# Patient Record
Sex: Female | Born: 1990
Health system: Southern US, Community
[De-identification: ages and names within clinical notes are randomized; demographics above are authoritative.]

## PROBLEM LIST (undated history)

## (undated) ENCOUNTER — Inpatient Hospital Stay (HOSPITAL_COMMUNITY): Payer: Self-pay

## (undated) DIAGNOSIS — J40 Bronchitis, not specified as acute or chronic: Secondary | ICD-10-CM

## (undated) DIAGNOSIS — S069X9A Unspecified intracranial injury with loss of consciousness of unspecified duration, initial encounter: Secondary | ICD-10-CM

## (undated) DIAGNOSIS — S069XAA Unspecified intracranial injury with loss of consciousness status unknown, initial encounter: Secondary | ICD-10-CM

## (undated) HISTORY — PX: NO PAST SURGERIES: SHX2092

## (undated) HISTORY — DX: Unspecified intracranial injury with loss of consciousness of unspecified duration, initial encounter: S06.9X9A

## (undated) HISTORY — DX: Unspecified intracranial injury with loss of consciousness status unknown, initial encounter: S06.9XAA

---
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
## 2009-01-13 ENCOUNTER — Emergency Department (HOSPITAL_COMMUNITY): Admission: EM | Admit: 2009-01-13 | Discharge: 2009-01-13 | Payer: Self-pay | Admitting: Emergency Medicine

## 2009-02-03 ENCOUNTER — Emergency Department (HOSPITAL_COMMUNITY): Admission: EM | Admit: 2009-02-03 | Discharge: 2009-02-03 | Payer: Self-pay | Admitting: Emergency Medicine

## 2009-10-07 ENCOUNTER — Emergency Department (HOSPITAL_COMMUNITY): Admission: EM | Admit: 2009-10-07 | Discharge: 2009-10-08 | Payer: Self-pay | Admitting: Emergency Medicine

## 2009-10-30 ENCOUNTER — Emergency Department (HOSPITAL_COMMUNITY): Admission: EM | Admit: 2009-10-30 | Discharge: 2009-10-30 | Payer: Self-pay | Admitting: Emergency Medicine

## 2010-08-21 ENCOUNTER — Emergency Department (HOSPITAL_COMMUNITY): Admission: EM | Admit: 2010-08-21 | Discharge: 2010-08-21 | Payer: Self-pay | Admitting: Emergency Medicine

## 2010-12-12 LAB — URINE MICROSCOPIC-ADD ON

## 2010-12-12 LAB — URINE CULTURE: Colony Count: >=100000

## 2010-12-12 LAB — URINALYSIS, ROUTINE W REFLEX MICROSCOPIC
Protein, ur: 100 mg/dL — AB
Specific Gravity, Urine: 1.036 — ABNORMAL HIGH (ref 1.005–1.030)
Urobilinogen, UA: 1 mg/dL (ref 0.0–1.0)
pH: 7 (ref 5.0–8.0)

## 2010-12-12 LAB — PREGNANCY, URINE: Preg Test, Ur: NEGATIVE

## 2010-12-12 LAB — GC/CHLAMYDIA PROBE AMP, GENITAL
Chlamydia, DNA Probe: NEGATIVE
GC Probe Amp, Genital: NEGATIVE

## 2010-12-12 LAB — WET PREP, GENITAL
Trich, Wet Prep: NONE SEEN
Yeast Wet Prep HPF POC: NONE SEEN

## 2010-12-15 LAB — DIFFERENTIAL
Basophils Relative: 0 % (ref 0–1)
Eosinophils Absolute: 0 10*3/uL (ref 0.0–0.7)
Eosinophils Relative: 0 % (ref 0–5)
Lymphocytes Relative: 16 % (ref 12–46)
Monocytes Absolute: 0.8 10*3/uL (ref 0.1–1.0)
Neutro Abs: 4.6 10*3/uL (ref 1.7–7.7)
Neutrophils Relative %: 72 % (ref 43–77)

## 2010-12-15 LAB — CBC
HCT: 31.9 % — ABNORMAL LOW (ref 36.0–46.0)
Hemoglobin: 10.9 g/dL — ABNORMAL LOW (ref 12.0–15.0)
MCHC: 34.1 g/dL (ref 30.0–36.0)
RBC: 3.59 MIL/uL — ABNORMAL LOW (ref 3.87–5.11)
RDW: 15.5 % (ref 11.5–15.5)

## 2010-12-15 LAB — POCT PREGNANCY, URINE: Preg Test, Ur: NEGATIVE

## 2010-12-15 LAB — URINALYSIS, ROUTINE W REFLEX MICROSCOPIC
Nitrite: POSITIVE — AB
Protein, ur: 100 mg/dL — AB
Specific Gravity, Urine: 1.024 (ref 1.005–1.030)
Urobilinogen, UA: 1 mg/dL (ref 0.0–1.0)
pH: 8.5 — ABNORMAL HIGH (ref 5.0–8.0)

## 2010-12-15 LAB — POCT I-STAT, CHEM 8
BUN: 14 mg/dL (ref 6–23)
Calcium, Ion: 1.12 mmol/L (ref 1.12–1.32)
Chloride: 106 meq/L (ref 96–112)
Creatinine, Ser: 0.7 mg/dL (ref 0.4–1.2)
Glucose, Bld: 78 mg/dL (ref 70–99)
HCT: 33 % — ABNORMAL LOW (ref 36.0–46.0)
Hemoglobin: 11.2 g/dL — ABNORMAL LOW (ref 12.0–15.0)
Potassium: 3.4 mEq/L — ABNORMAL LOW (ref 3.5–5.1)
Sodium: 137 mEq/L (ref 135–145)
TCO2: 23 mmol/L (ref 0–100)

## 2010-12-15 LAB — URINE MICROSCOPIC-ADD ON

## 2010-12-15 LAB — RAPID STREP SCREEN (MED CTR MEBANE ONLY): Streptococcus, Group A Screen (Direct): NEGATIVE

## 2011-01-04 LAB — POCT I-STAT, CHEM 8
Glucose, Bld: 86 mg/dL (ref 70–99)
Hemoglobin: 11.6 g/dL — ABNORMAL LOW (ref 12.0–15.0)
Potassium: 3.9 mEq/L (ref 3.5–5.1)
Sodium: 140 mEq/L (ref 135–145)

## 2011-01-04 LAB — URINALYSIS, ROUTINE W REFLEX MICROSCOPIC
Nitrite: POSITIVE — AB
Protein, ur: NEGATIVE mg/dL
Urobilinogen, UA: 1 mg/dL (ref 0.0–1.0)

## 2011-01-04 LAB — URINE CULTURE: Colony Count: >=100000

## 2011-01-04 LAB — DIFFERENTIAL
Basophils Absolute: 0 10*3/uL (ref 0.0–0.1)
Basophils Relative: 0 % (ref 0–1)
Lymphocytes Relative: 13 % (ref 12–46)
Lymphs Abs: 1.6 10*3/uL (ref 0.7–4.0)
Monocytes Absolute: 0.7 10*3/uL (ref 0.1–1.0)
Monocytes Relative: 6 % (ref 3–12)
Neutrophils Relative %: 75 % (ref 43–77)

## 2011-01-04 LAB — CBC
Hemoglobin: 11.1 g/dL — ABNORMAL LOW (ref 12.0–15.0)
Platelets: 339 10*3/uL (ref 150–400)
RBC: 3.73 MIL/uL — ABNORMAL LOW (ref 3.87–5.11)
RDW: 16.6 % — ABNORMAL HIGH (ref 11.5–15.5)

## 2011-01-04 LAB — URINE MICROSCOPIC-ADD ON

## 2011-01-05 LAB — URINALYSIS, ROUTINE W REFLEX MICROSCOPIC
Glucose, UA: NEGATIVE mg/dL
Ketones, ur: 15 mg/dL — AB
Protein, ur: NEGATIVE mg/dL
Specific Gravity, Urine: 1.027 (ref 1.005–1.030)
Urobilinogen, UA: 1 mg/dL (ref 0.0–1.0)

## 2011-01-05 LAB — GC/CHLAMYDIA PROBE AMP, GENITAL: Chlamydia, DNA Probe: NEGATIVE

## 2011-01-05 LAB — URINE CULTURE: Colony Count: >=100000

## 2011-01-05 LAB — POCT PREGNANCY, URINE: Preg Test, Ur: NEGATIVE

## 2011-01-05 LAB — WET PREP, GENITAL: Trich, Wet Prep: NONE SEEN

## 2011-01-05 LAB — URINE MICROSCOPIC-ADD ON

## 2011-01-26 ENCOUNTER — Inpatient Hospital Stay (HOSPITAL_COMMUNITY)
Admission: RE | Admit: 2011-01-26 | Discharge: 2011-01-26 | Disposition: A | Discharge status: Home / Self Care | Payer: Self-pay | Source: Ambulatory Visit | Attending: Obstetrics and Gynecology | Admitting: Obstetrics and Gynecology

## 2011-01-26 DIAGNOSIS — N949 Unspecified condition associated with female genital organs and menstrual cycle: Secondary | ICD-10-CM | POA: Insufficient documentation

## 2011-01-26 DIAGNOSIS — N760 Acute vaginitis: Secondary | ICD-10-CM | POA: Insufficient documentation

## 2011-01-26 DIAGNOSIS — N390 Urinary tract infection, site not specified: Secondary | ICD-10-CM | POA: Insufficient documentation

## 2011-01-26 LAB — URINALYSIS, ROUTINE W REFLEX MICROSCOPIC
Bilirubin Urine: NEGATIVE
Glucose, UA: NEGATIVE mg/dL
Ketones, ur: NEGATIVE mg/dL
Nitrite: POSITIVE — AB
Protein, ur: NEGATIVE mg/dL
pH: 6 (ref 5.0–8.0)

## 2011-01-26 LAB — URINE MICROSCOPIC-ADD ON

## 2011-01-26 LAB — WET PREP, GENITAL: Yeast Wet Prep HPF POC: NONE SEEN

## 2011-01-26 LAB — POCT PREGNANCY, URINE: Preg Test, Ur: NEGATIVE

## 2011-01-27 LAB — GC/CHLAMYDIA PROBE AMP, GENITAL: Chlamydia, DNA Probe: UNDETERMINED

## 2011-01-27 LAB — HERPES SIMPLEX VIRUS CULTURE

## 2011-03-06 ENCOUNTER — Emergency Department (HOSPITAL_COMMUNITY): Payer: Self-pay

## 2011-03-06 ENCOUNTER — Emergency Department (HOSPITAL_COMMUNITY)
Admission: EM | Admit: 2011-03-06 | Discharge: 2011-03-06 | Disposition: A | Discharge status: Home / Self Care | Payer: Self-pay | Attending: Emergency Medicine | Admitting: Emergency Medicine

## 2011-03-06 DIAGNOSIS — J069 Acute upper respiratory infection, unspecified: Secondary | ICD-10-CM | POA: Insufficient documentation

## 2011-03-06 DIAGNOSIS — J3489 Other specified disorders of nose and nasal sinuses: Secondary | ICD-10-CM | POA: Insufficient documentation

## 2011-03-06 DIAGNOSIS — R059 Cough, unspecified: Secondary | ICD-10-CM | POA: Insufficient documentation

## 2011-03-06 DIAGNOSIS — R05 Cough: Secondary | ICD-10-CM | POA: Insufficient documentation

## 2011-06-23 LAB — URINE CULTURE: Colony Count: >=100000

## 2011-06-23 LAB — URINALYSIS, ROUTINE W REFLEX MICROSCOPIC
Glucose, UA: NEGATIVE
Ketones, ur: NEGATIVE
Nitrite: POSITIVE — AB
Protein, ur: 30 — AB
pH: 6

## 2011-06-23 LAB — URINE MICROSCOPIC-ADD ON

## 2011-06-23 LAB — WET PREP, GENITAL

## 2011-06-23 LAB — HCG, QUANTITATIVE, PREGNANCY: hCG, Beta Chain, Quant, S: 3435 — ABNORMAL HIGH

## 2011-06-23 LAB — ABO/RH: ABO/RH(D): A POS

## 2011-06-23 LAB — CBC
Hemoglobin: 11.6 — ABNORMAL LOW
MCHC: 33.4
RBC: 3.77 — ABNORMAL LOW
WBC: 8.9

## 2011-06-23 LAB — GC/CHLAMYDIA PROBE AMP, GENITAL: GC Probe Amp, Genital: NEGATIVE

## 2011-08-07 ENCOUNTER — Emergency Department (HOSPITAL_COMMUNITY)
Admission: EM | Admit: 2011-08-07 | Discharge: 2011-08-07 | Discharge status: Against Medical Advice | Payer: Self-pay | Attending: Emergency Medicine | Admitting: Emergency Medicine

## 2011-08-07 ENCOUNTER — Encounter: Payer: Self-pay | Admitting: Emergency Medicine

## 2011-08-07 DIAGNOSIS — J069 Acute upper respiratory infection, unspecified: Secondary | ICD-10-CM | POA: Insufficient documentation

## 2011-08-07 HISTORY — DX: Bronchitis, not specified as acute or chronic: J40

## 2011-08-07 NOTE — ED Notes (Signed)
Pt reports cold symptoms x 1- 1/2 weeks. Pt presents with stuffy nose, watery eyes and productive cough.

## 2011-08-07 NOTE — ED Notes (Signed)
Attempted to locate pt in triage and wait, no answer

## 2012-04-24 LAB — OB RESULTS CONSOLE RPR: RPR: NONREACTIVE

## 2012-04-24 LAB — OB RESULTS CONSOLE RUBELLA ANTIBODY, IGM: Rubella: NON-IMMUNE/NOT IMMUNE

## 2012-04-24 LAB — OB RESULTS CONSOLE ANTIBODY SCREEN: Antibody Screen: NEGATIVE

## 2012-04-24 LAB — OB RESULTS CONSOLE ABO/RH: RH Type: POSITIVE

## 2012-08-17 LAB — OB RESULTS CONSOLE HGB/HCT, BLOOD: Hemoglobin: 11.1 g/dL

## 2012-08-17 LAB — GLUCOSE TOLERANCE, 1 HOUR (50G) W/O FASTING: Glucose, GTT - 1 Hour: 101 mg/dL (ref ?–200)

## 2012-08-17 LAB — OB RESULTS CONSOLE PLATELET COUNT: Platelets: 326 10*3/uL

## 2012-08-17 LAB — OB RESULTS CONSOLE HIV ANTIBODY (ROUTINE TESTING): HIV: NONREACTIVE

## 2012-08-25 ENCOUNTER — Encounter (HOSPITAL_COMMUNITY): Payer: Self-pay | Admitting: Emergency Medicine

## 2012-08-25 ENCOUNTER — Emergency Department (HOSPITAL_COMMUNITY)
Admission: EM | Admit: 2012-08-25 | Discharge: 2012-08-25 | Disposition: A | Discharge status: Home / Self Care | Payer: Medicaid - Out of State | Attending: Emergency Medicine | Admitting: Emergency Medicine

## 2012-08-25 DIAGNOSIS — J40 Bronchitis, not specified as acute or chronic: Secondary | ICD-10-CM

## 2012-08-25 DIAGNOSIS — F17200 Nicotine dependence, unspecified, uncomplicated: Secondary | ICD-10-CM | POA: Insufficient documentation

## 2012-08-25 DIAGNOSIS — Z79899 Other long term (current) drug therapy: Secondary | ICD-10-CM | POA: Insufficient documentation

## 2012-08-25 DIAGNOSIS — R0602 Shortness of breath: Secondary | ICD-10-CM | POA: Insufficient documentation

## 2012-08-25 DIAGNOSIS — R0789 Other chest pain: Secondary | ICD-10-CM | POA: Insufficient documentation

## 2012-08-25 DIAGNOSIS — R1110 Vomiting, unspecified: Secondary | ICD-10-CM | POA: Insufficient documentation

## 2012-08-25 DIAGNOSIS — J3489 Other specified disorders of nose and nasal sinuses: Secondary | ICD-10-CM | POA: Insufficient documentation

## 2012-08-25 DIAGNOSIS — R062 Wheezing: Secondary | ICD-10-CM | POA: Insufficient documentation

## 2012-08-25 MED ORDER — PREDNISONE 20 MG PO TABS: 20.0000 mg | ORAL_TABLET | Freq: Two times a day (BID) | ORAL | Status: DC

## 2012-08-25 MED ORDER — IPRATROPIUM BROMIDE 0.02 % IN SOLN
0.5000 mg | Freq: Once | RESPIRATORY_TRACT | Status: AC
  Administered 2012-08-25: 0.5 mg via RESPIRATORY_TRACT
  Filled 2012-08-25: qty 2.5

## 2012-08-25 MED ORDER — ALBUTEROL SULFATE (5 MG/ML) 0.5% IN NEBU
5.0000 mg | INHALATION_SOLUTION | Freq: Once | RESPIRATORY_TRACT | Status: AC
  Administered 2012-08-25: 5 mg via RESPIRATORY_TRACT
  Filled 2012-08-25: qty 1

## 2012-08-25 MED ORDER — ALBUTEROL SULFATE HFA 108 (90 BASE) MCG/ACT IN AERS
INHALATION_SPRAY | RESPIRATORY_TRACT | Status: AC
  Filled 2012-08-25: qty 6.7

## 2012-08-25 MED ORDER — PREDNISONE 20 MG PO TABS
60.0000 mg | ORAL_TABLET | Freq: Once | ORAL | Status: AC
  Administered 2012-08-25: 60 mg via ORAL
  Filled 2012-08-25: qty 3

## 2012-08-25 NOTE — ED Notes (Signed)
Breathing treatment started by respiratory.

## 2012-08-25 NOTE — ED Provider Notes (Signed)
I personally performed the services described in this documentation, which was scribed in my presence. The recorded information has been reviewed and is accurate.  Tysheem Accardo L Allenmichael Mcpartlin, MD 08/25/12 2323 

## 2012-08-25 NOTE — ED Provider Notes (Signed)
History     CSN: [id number]  Arrival date & time 08/25/12  4098   First MD Initiated Contact with Patient 08/25/12 2012      Chief Complaint  Patient presents with  . Cough  . Shortness of Breath    (Consider location/radiation/quality/duration/timing/severity/associated sxs/prior treatment) HPI Comments: 21 year old female that is 5 months pregnant presents emergency department with chief complaint of cough.  Patient states she has a history of bronchitis and that her symptoms have been gradually worsening over the last 5 days.  Patient is concerned to take any medication because of her pregnancy and has not tried any treatments.  Associated symptoms include posttussive emesis, shortness of breath and nasal congestion, but patient denies fevers, night sweats, chills, chest pain, syncope.  The history is provided by the patient.    Past Medical History  Diagnosis Date  . Bronchitis     History reviewed. No pertinent past surgical history.  History reviewed. No pertinent family history.  History  Substance Use Topics  . Smoking status: Current Every Day Smoker  . Smokeless tobacco: Not on file  . Alcohol Use: Yes    OB History    Grav Para Term Preterm Abortions TAB SAB Ect Mult Living   1               Review of Systems  Constitutional: Negative for fever, chills, appetite change and fatigue.  HENT: Positive for congestion and rhinorrhea. Negative for hearing loss, ear pain, nosebleeds, sore throat, sneezing, trouble swallowing, neck stiffness, voice change, postnasal drip, sinus pressure, tinnitus and ear discharge.   Eyes: Negative for photophobia and visual disturbance.  Respiratory: Positive for cough, chest tightness, shortness of breath and wheezing. Negative for apnea, choking and stridor.   Cardiovascular: Negative for chest pain, palpitations and leg swelling.  Gastrointestinal: Negative for nausea, vomiting, abdominal pain, diarrhea and constipation.    Genitourinary: Negative for dysuria, urgency and flank pain.  Musculoskeletal: Negative for myalgias.  Neurological: Negative for dizziness, seizures, syncope, weakness, light-headedness, numbness and headaches.  Psychiatric/Behavioral: Negative for behavioral problems and confusion.  All other systems reviewed and are negative.    Allergies  Review of patient's allergies indicates no known allergies.  Home Medications   Current Outpatient Rx  Name  Route  Sig  Dispense  Refill  . FERROUS SULFATE 325 (65 FE) MG PO TABS   Oral   Take 325 mg by mouth daily with breakfast.         . LORATADINE 10 MG PO TABS   Oral   Take 10 mg by mouth daily.         Marland Kitchen PRENATAL MULTIVITAMIN CH   Oral   Take 1 tablet by mouth daily.           BP 125/70  Pulse 103  Temp 98.4 F (36.9 C) (Oral)  Resp 20  SpO2 96%  LMP 07/14/2011  Physical Exam  Nursing note and vitals reviewed. Constitutional: She is oriented to person, place, and time. She appears well-developed and well-nourished. No distress.  HENT:  Head: Normocephalic and atraumatic.       Nasal congestion & rhinorrhea. Normal L & R external ear canals and TMs. No tragal or mastoid tenderness. Oropharynx moist, without tonsillar exudate.   Eyes:       No pain w EOM. Conjunctiva normal   Neck: Normal range of motion.       Soft, no nuchal rigidity. No lymphadenopathy  Cardiovascular:  RRR, no aberrancy on auscultation  Pulmonary/Chest: Effort normal.       No respiratory distress, expiratory wheezing   Abdominal:       gravid  Musculoskeletal: Normal range of motion.  Neurological: She is alert and oriented to person, place, and time.  Skin: Skin is warm and dry. She is not diaphoretic.       No rash  Psychiatric: She has a normal mood and affect. Her behavior is normal.    ED Course  Procedures (including critical care time)  Labs Reviewed - No data to display No results found.   No diagnosis found.  Pt  case discussed with Dr. Effie Shy to decide what treatments can be used d/t 5 mo pregnant. Plan to treat w neb & steroids. Presentation consistent w bronchitis. Imaging not indicated at this time. Advised return if fever develops or symptoms worsen   MDM  21 yo 5 mo pregnant pt presents w nasal congestion, cough and SOB- likely viral etiology. Discussed that antibiotics are not indicated for viral infections.  Verbalizes understanding and is agreeable with plan as above. Lung auscultation improved after treatment & pt able to speak in full sentences, no evidence of acute respiratory distress.  Pt is hemodynamically stable & in NAD prior to dc.          Jaci Carrel, [place] 08/25/12 2111

## 2012-08-25 NOTE — ED Notes (Signed)
Patient states that she started to have SOB and cough today. She states that she is 5 months pregnant

## 2012-09-13 ENCOUNTER — Encounter (HOSPITAL_COMMUNITY): Payer: Self-pay | Admitting: *Deleted

## 2012-09-13 ENCOUNTER — Inpatient Hospital Stay (HOSPITAL_COMMUNITY)
Admission: AD | Admit: 2012-09-13 | Discharge: 2012-09-14 | Disposition: A | Discharge status: Home / Self Care | Payer: Medicaid - Out of State | Source: Ambulatory Visit | Attending: Obstetrics & Gynecology | Admitting: Obstetrics & Gynecology

## 2012-09-13 DIAGNOSIS — O99891 Other specified diseases and conditions complicating pregnancy: Secondary | ICD-10-CM | POA: Insufficient documentation

## 2012-09-13 DIAGNOSIS — R109 Unspecified abdominal pain: Secondary | ICD-10-CM | POA: Insufficient documentation

## 2012-09-13 DIAGNOSIS — W010XXA Fall on same level from slipping, tripping and stumbling without subsequent striking against object, initial encounter: Secondary | ICD-10-CM

## 2012-09-13 NOTE — MAU Note (Signed)
Pt stated she triped on floor and slipped and fell on left side. Hit stomach on her side. Pt reports having mild discomfort  To abd at this time. Reports good fetal movement.

## 2012-09-13 NOTE — MAU Provider Note (Signed)
  History     CSN: [id number]  Arrival date and time: 09/13/12 2200   None     Chief Complaint  Patient presents with  . Fall   HPI Mrs Berch is 21yo G2P0010 with GA [redacted]w[redacted]d who felt on her abdomen around 21:30. Pt reports some abdominal pain, in lower abdomen. She denies vaginal discharge or bleeding. Good fetal movement. Pt has her pre natal care in [place].   Past Medical History  Diagnosis Date  . Bronchitis     Past Surgical History  Procedure Date  . No past surgeries     Family History  Problem Relation Age of Onset  . Other Neg Hx     History  Substance Use Topics  . Smoking status: Former Games developer  . Smokeless tobacco: Not on file  . Alcohol Use: No    Allergies: No Known Allergies  Prescriptions prior to admission  Medication Sig Dispense Refill  . ferrous sulfate 325 (65 FE) MG tablet Take 325 mg by mouth daily with breakfast.      . Prenatal Vit-Fe Fumarate-FA (PRENATAL MULTIVITAMIN) TABS Take 1 tablet by mouth daily.      Marland Kitchen loratadine (CLARITIN) 10 MG tablet Take 10 mg by mouth daily.      . predniSONE (DELTASONE) 20 MG tablet Take 1 tablet (20 mg total) by mouth 2 (two) times daily.  6 tablet  0    Review of Systems  Constitutional: Negative for fever.  Eyes: Negative for blurred vision.  Cardiovascular: Negative for chest pain.  Gastrointestinal: Positive for abdominal pain. Negative for nausea.  Neurological: Negative for headaches.   Physical Exam   Blood pressure 107/72, pulse 94, temperature 98.4 F (36.9 C), temperature source Oral, resp. rate 18, height 5\' 4"  (1.626 m), weight 68.402 kg (150 lb 12.8 oz), last menstrual period 07/14/2011.  Physical Exam  Constitutional: She appears well-developed.  Cardiovascular: Normal rate.   Respiratory: Effort normal.  GI: Soft.  FHT: 140s, accels presents, no decels. Moderate variability.  CAtegory I  MAU Course  Procedures  MDM Monitoring for 4h Kleihauer-Betke stain  negative  Assessment and Plan  Mrs Teachey is a 21yo F, G2P0010, with GA [redacted]w[redacted]d who felt on her abdomen. After monitoring for 4 hours patient didn't present contractions and her Kleihauher-betke was negative Pt was stable to be discharge.  Governor Specking 09/13/2012, 11:47 PM

## 2012-09-14 LAB — KLEIHAUER-BETKE STAIN: Fetal Cells %: 0 %

## 2012-09-14 MED ORDER — ACETAMINOPHEN 325 MG PO TABS: 650.0000 mg | ORAL_TABLET | Freq: Four times a day (QID) | ORAL | Status: DC | PRN

## 2012-09-14 MED ORDER — ACETAMINOPHEN 325 MG PO TABS
650.0000 mg | ORAL_TABLET | Freq: Four times a day (QID) | ORAL | Status: DC | PRN
  Administered 2012-09-14: 650 mg via ORAL
  Filled 2012-09-14: qty 2

## 2012-09-14 NOTE — MAU Provider Note (Signed)
I have seen and examined this patient and agree the above assessment. CRESENZO-DISHMAN,Laverle Pillard 09/14/2012 6:43 AM

## 2012-09-26 NOTE — L&D Delivery Note (Signed)

## 2012-09-26 NOTE — L&D Delivery Note (Signed)
Delivery Note At 12:07 AM a viable and healthy female was delivered via  (Presentation: OP ;  ).  APGAR:8, 9; weight pending.   Placenta status: complete delivery, calcifications noted, sent to pathology.  Cord: 3 vessel.   Anesthesia: Epidural  Episiotomy: None Lacerations: 1st degree  Suture Repair: 3.0 vicryl rapide Est. Blood Loss (mL): 250  Mom to postpartum.  Baby to nursery-stable.  Gregor Hams 12/05/2012, 1:46 AM   At 0007 ths 23 y/o G1 now P1001 delivered a viable female infant over an intact perineum by NSVD with APGARs at 8 and 9.  Pt's pain was managed epidural.  Infant was placed on mom's chest after delivery and cord clamping was delayed till pulsation and stopped within the cord.  Cord was clamped and Dad cut the cord.  Cord blood was collected and sent.  Intact 3 vessel cord placenta were delivered spontaneiously.  Cervix and vagina were inspected.  1st degree laceration was repaired with 3.0 vicryl rapide.  EBL 250.  Pt stable to mother baby and baby to [facility].  Delivery supervised by Caren Griffins and preformed by Gregor Hams, DO.    Evaluation and management procedures were performed by Resident physician under my supervision/collaboration. Chart reviewed, patient examined by me and I agree with management and plan. Present and assisted. Wt 6#7oz Position OA>LOA.

## 2012-11-01 LAB — OB RESULTS CONSOLE GBS: GBS: NEGATIVE

## 2012-11-15 ENCOUNTER — Inpatient Hospital Stay (HOSPITAL_COMMUNITY)
Admission: AD | Admit: 2012-11-15 | Discharge: 2012-11-15 | Disposition: A | Discharge status: Home / Self Care | Payer: Medicaid Other | Source: Ambulatory Visit | Attending: Obstetrics & Gynecology | Admitting: Obstetrics & Gynecology

## 2012-11-15 ENCOUNTER — Encounter (HOSPITAL_COMMUNITY): Payer: Self-pay | Admitting: *Deleted

## 2012-11-15 ENCOUNTER — Inpatient Hospital Stay (HOSPITAL_COMMUNITY): Payer: Medicaid Other

## 2012-11-15 DIAGNOSIS — Z3490 Encounter for supervision of normal pregnancy, unspecified, unspecified trimester: Secondary | ICD-10-CM

## 2012-11-15 DIAGNOSIS — O368190 Decreased fetal movements, unspecified trimester, not applicable or unspecified: Secondary | ICD-10-CM

## 2012-11-15 LAB — WET PREP, GENITAL
Clue Cells Wet Prep HPF POC: NONE SEEN
Trich, Wet Prep: NONE SEEN
Yeast Wet Prep HPF POC: NONE SEEN

## 2012-11-15 NOTE — MAU Note (Signed)
Pt moved here from [place]. Told her baby was not growing was having stress test  2x week in [place].  Nedding to establish care here.

## 2012-11-15 NOTE — MAU Provider Note (Signed)
History    CSN: [id number]  Arrival date and time: 11/15/12 1302   First Provider Initiated Contact with Patient 11/15/12 1426      Chief Complaint  Patient presents with  . Non-stress Test   HPI Leah Rojas is a 22 yo G2P0 female at [redacted]w[redacted]d gestation. The patient reports that she has been getting all of her prenatal care in [place]. She reports that her pregnancy has been uncomplicated. She was diagnosed with a mild anemia during the pregnancy and took iron for a month or two. She reports that at 35 weeks she was seen by her doctor and was told that the baby was "normal to small" by ultrasound. Her doctor recommended that she have NSTs every week until delivery. She states that at her 36 week appointment the doctor measured her and her fundal height was 33 cm. Her doctor told her he was worried about the baby's growth and that she may need to deliver early. In early January she was told that her baby was 4 lbs by [place].   The patient then decided that she would rather be near family for the delivery and decided to come to [place] for the remainder of her pregnancy. She reports normal fetal movement, and denies vaginal bleeding or vaginal discharge. She reports mild low back pain for the last few days, but denies any other pressure or contractions.    OB History   Grav Para Term Preterm Abortions TAB SAB Ect Mult Living   2 0 0 0 1 0 1 0 0 0       Past Medical History  Diagnosis Date  . Bronchitis     Past Surgical History  Procedure Laterality Date  . No past surgeries      Family History  Problem Relation Age of Onset  . Other Neg Hx     History  Substance Use Topics  . Smoking status: Former Games developer  . Smokeless tobacco: Not on file  . Alcohol Use: No    Allergies: No Known Allergies  Prescriptions prior to admission  Medication Sig Dispense Refill  . acetaminophen (TYLENOL) 325 MG tablet Take 2 tablets (650 mg total) by mouth every 6 (six) hours as needed.  10  tablet  1  . ferrous sulfate 325 (65 FE) MG tablet Take 325 mg by mouth daily with breakfast.      . loratadine (CLARITIN) 10 MG tablet Take 10 mg by mouth daily.      . predniSONE (DELTASONE) 20 MG tablet Take 1 tablet (20 mg total) by mouth 2 (two) times daily.  6 tablet  0  . Prenatal Vit-Fe Fumarate-FA (PRENATAL MULTIVITAMIN) TABS Take 1 tablet by mouth daily.        Review of Systems  Constitutional: Negative for fever.  Eyes: Negative for blurred vision.  Gastrointestinal: Negative for nausea, vomiting and abdominal pain.  Genitourinary: Negative for dysuria and urgency.  Musculoskeletal: Positive for back pain.  Neurological: Negative for headaches.    Physical Exam   Blood pressure 116/70, pulse 96, temperature 97.4 F (36.3 C), temperature source Oral, resp. rate 18, last menstrual period 07/14/2011.  Physical Exam  General appearance - alert, well appearing, and in no distress and oriented to person, place, and time Abdomen - nontender, gravid abdomen, no masses or organomegaly, fundal height measures 34 cm  Pelvic - VULVA: normal appearing vulva with no masses, tenderness or lesions, VAGINA: normal appearing vagina with normal color, no lesions, vaginal discharge - white, CERVIX:  normal appearing cervix with no lesions, cervical discharge present - white Extremities - peripheral pulses normal, no pedal edema, no clubbing or cyanosis  FHT: baseline 145, moderate variability, accels present, no decels TOCO No contractions observed on strip   MAU Course  Procedures   Assessment and Plan  Assessment: Leah Rojas is a 22 year old G2P1 at [redacted]w[redacted]d gestation who presents complaining of decreased fetal growth. Her prenatal doctors in [place] had her getting weekly NSTs since 35 weeks, and she is now receiving her care in [place] and wanted to check the baby's activity and establish herself as a patient. BPP 8/8. G/C and GBS collected. Category I strip.  Plan: 1.  Results pending- will call if positive 2. Follow up appointment in [facility]   Adela Glimpse 11/15/2012, 2:44 PM    I saw and examined patient and agree with above student note. Records requested from [facility] in [place]. Outpatient ultrasound ordered for growth and appt requested in [facility] asap. GBS, GC/Chlamydia collected. NST reviewed, reactive. BPP 8/8.  Records reviewed, EFW 14% on 10/22/12. 1 hour GTT at 28 wks 101.  Napoleon Form, MD

## 2012-11-16 LAB — GC/CHLAMYDIA PROBE AMP: CT Probe RNA: NEGATIVE

## 2012-11-20 ENCOUNTER — Encounter: Payer: Self-pay | Admitting: Advanced Practice Midwife

## 2012-11-22 ENCOUNTER — Encounter: Payer: Self-pay | Admitting: Obstetrics & Gynecology

## 2012-11-22 ENCOUNTER — Ambulatory Visit (INDEPENDENT_AMBULATORY_CARE_PROVIDER_SITE_OTHER): Payer: Medicaid - Out of State | Admitting: Obstetrics & Gynecology

## 2012-11-22 ENCOUNTER — Other Ambulatory Visit: Payer: Self-pay | Admitting: Obstetrics & Gynecology

## 2012-11-22 VITALS — BP 120/72 | Temp 97.0°F | Wt 164.3 lb

## 2012-11-22 DIAGNOSIS — Z3480 Encounter for supervision of other normal pregnancy, unspecified trimester: Secondary | ICD-10-CM

## 2012-11-22 DIAGNOSIS — Z3483 Encounter for supervision of other normal pregnancy, third trimester: Secondary | ICD-10-CM

## 2012-11-22 LAB — POCT URINALYSIS DIP (DEVICE)
Glucose, UA: NEGATIVE mg/dL
Hgb urine dipstick: NEGATIVE
Nitrite: NEGATIVE
Urobilinogen, UA: 0.2 mg/dL (ref 0.0–1.0)
pH: 7 (ref 5.0–8.0)

## 2012-11-22 LAB — OB RESULTS CONSOLE GBS: GBS: POSITIVE

## 2012-11-22 NOTE — Progress Notes (Signed)
Nutrition note: 1st visit consult Pt has gained 30.3# @ [redacted]w[redacted]d, which is wnl. Pt reports eating 3-4 meals & 4 snacks/d. Pt reports no N&V but does have heartburn. NKFA Pt is taking PNV. Pt reports walking most days. Pt received verbal and written education on general nutrition during pregnancy. Disc tips to decrease heartburn. Disc importance of BF. Disc wt gain goals of 25-35# or 1#/wk. Pt agrees to continue taking PNV. Pt has WIC in [place] & plans to transfer to [facility]. Pt plans to BF. F/u if referred Blondell Reveal, MS, RD, LDN

## 2012-11-22 NOTE — Patient Instructions (Signed)
Normal Labor and Delivery  Your caregiver must first be sure you are in labor. Signs of labor include:  · You may pass what is called "the mucus plug" before labor begins. This is a small amount of blood stained mucus.  · Regular uterine contractions.  · The time between contractions get closer together.  · The discomfort and pain gradually gets more intense.  · Pains are mostly located in the back.  · Pains get worse when walking.  · The cervix (the opening of the uterus becomes thinner (begins to efface) and opens up (dilates).  Once you are in labor and admitted into the hospital or care center, your caregiver will do the following:  · A complete physical examination.  · Check your vital signs (blood pressure, pulse, temperature and the fetal heart rate).  · Do a vaginal examination (using a sterile glove and lubricant) to determine:  · The position (presentation) of the baby (head [vertex] or buttock first).  · The level (station) of the baby's head in the birth canal.  · The effacement and dilatation of the cervix.  · You may have your pubic hair shaved and be given an enema depending on your caregiver and the circumstance.  · An electronic monitor is usually placed on your abdomen. The monitor follows the length and intensity of the contractions, as well as the baby's heart rate.  · Usually, your caregiver will insert an IV in your arm with a bottle of sugar water. This is done as a precaution so that medications can be given to you quickly during labor or delivery.  NORMAL LABOR AND DELIVERY IS DIVIDED UP INTO 3 STAGES:  First Stage  This is when regular contractions begin and the cervix begins to efface and dilate. This stage can last from 3 to 15 hours. The end of the first stage is when the cervix is 100% effaced and 10 centimeters dilated. Pain medications may be given by   · Injection (morphine, demerol, etc.)  · Regional anesthesia (spinal, caudal or epidural, anesthetics given in different locations of  the spine). Paracervical pain medication may be given, which is an injection of and anesthetic on each side of the cervix.  A pregnant woman may request to have "Natural Childbirth" which is not to have any medications or anesthesia during her labor and delivery.  Second Stage  This is when the baby comes down through the birth canal (vagina) and is born. This can take 1 to 4 hours. As the baby's head comes down through the birth canal, you may feel like you are going to have a bowel movement. You will get the urge to bear down and push until the baby is delivered. As the baby's head is being delivered, the caregiver will decide if an episiotomy (a cut in the perineum and vagina area) is needed to prevent tearing of the tissue in this area. The episiotomy is sewn up after the delivery of the baby and placenta. Sometimes a mask with nitrous oxide is given for the mother to breath during the delivery of the baby to help if there is too much pain. The end of Stage 2 is when the baby is fully delivered. Then when the umbilical cord stops pulsating it is clamped and cut.  Third Stage  The third stage begins after the baby is completely delivered and ends after the placenta (afterbirth) is delivered. This usually takes 5 to 30 minutes. After the placenta is delivered, a medication   is given either by intravenous or injection to help contract the uterus and prevent bleeding. The third stage is not painful and pain medication is usually not necessary. If an episiotomy was done, it is repaired at this time.  After the delivery, the mother is watched and monitored closely for 1 to 2 hours to make sure there is no postpartum bleeding (hemorrhage). If there is a lot of bleeding, medication is given to contract the uterus and stop the bleeding.  Document Released: 06/21/2008 Document Revised: 12/05/2011 Document Reviewed: 06/21/2008  [employer]® Patient Information ©2013 [employer].

## 2012-11-22 NOTE — Progress Notes (Signed)
U/S scheduled 11/26/12 at 1045 am.

## 2012-11-25 LAB — CULTURE, OB URINE: Colony Count: 5000

## 2012-11-26 ENCOUNTER — Other Ambulatory Visit: Payer: Self-pay | Admitting: Obstetrics & Gynecology

## 2012-11-26 ENCOUNTER — Ambulatory Visit (HOSPITAL_COMMUNITY)
Admission: RE | Admit: 2012-11-26 | Discharge: 2012-11-26 | Disposition: A | Discharge status: Home / Self Care | Payer: Medicaid Other | Source: Ambulatory Visit | Attending: Obstetrics & Gynecology | Admitting: Obstetrics & Gynecology

## 2012-11-26 DIAGNOSIS — O365990 Maternal care for other known or suspected poor fetal growth, unspecified trimester, not applicable or unspecified: Secondary | ICD-10-CM | POA: Insufficient documentation

## 2012-11-26 DIAGNOSIS — Z3483 Encounter for supervision of other normal pregnancy, third trimester: Secondary | ICD-10-CM

## 2012-11-26 DIAGNOSIS — Z3689 Encounter for other specified antenatal screening: Secondary | ICD-10-CM | POA: Insufficient documentation

## 2012-11-28 ENCOUNTER — Telehealth: Payer: Self-pay | Admitting: General Practice

## 2012-11-28 DIAGNOSIS — B951 Streptococcus, group B, as the cause of diseases classified elsewhere: Secondary | ICD-10-CM

## 2012-11-28 MED ORDER — AMPICILLIN 500 MG PO CAPS: 500.0000 mg | ORAL_CAPSULE | Freq: Four times a day (QID) | ORAL | Status: DC

## 2012-11-28 NOTE — Telephone Encounter (Signed)
Called patient and informed her of UTI and that an antibiotic was called in to her pharmacy. Patient verbalized understanding and states that she doesn't have medicaid right now and if this is an expensive medication or not. Told patient I wasn't sure, but that it was a generic medication so it should be cheaper but she could call her pharmacy and ask for the price. Patient stated she would do that and if she couldn't afford the medication she would just talk to her doctor about it tomorrow at her prenatal visit. Told patient that was fine. Patient had no further questions

## 2012-11-28 NOTE — Telephone Encounter (Signed)
Message copied by Kathee Delton on Wed Nov 28, 2012  1:17 PM ------      Message from: Adam Phenix      Created: Tue Nov 27, 2012 11:36 AM       GBS pos urine, Rx ampicillin 500 mg QID po 7 days ------

## 2012-11-29 ENCOUNTER — Ambulatory Visit (INDEPENDENT_AMBULATORY_CARE_PROVIDER_SITE_OTHER): Payer: Medicaid Other | Admitting: Advanced Practice Midwife

## 2012-11-29 ENCOUNTER — Other Ambulatory Visit: Payer: Self-pay | Admitting: Advanced Practice Midwife

## 2012-11-29 ENCOUNTER — Telehealth (HOSPITAL_COMMUNITY): Payer: Self-pay | Admitting: *Deleted

## 2012-11-29 ENCOUNTER — Encounter (HOSPITAL_COMMUNITY): Payer: Self-pay | Admitting: *Deleted

## 2012-11-29 VITALS — BP 113/69 | Wt 165.3 lb

## 2012-11-29 DIAGNOSIS — O26849 Uterine size-date discrepancy, unspecified trimester: Secondary | ICD-10-CM

## 2012-11-29 DIAGNOSIS — Z3483 Encounter for supervision of other normal pregnancy, third trimester: Secondary | ICD-10-CM

## 2012-11-29 DIAGNOSIS — O26843 Uterine size-date discrepancy, third trimester: Secondary | ICD-10-CM

## 2012-11-29 DIAGNOSIS — Z3480 Encounter for supervision of other normal pregnancy, unspecified trimester: Secondary | ICD-10-CM

## 2012-11-29 LAB — POCT URINALYSIS DIP (DEVICE)
Bilirubin Urine: NEGATIVE
Hgb urine dipstick: NEGATIVE
Ketones, ur: NEGATIVE mg/dL
Protein, ur: NEGATIVE mg/dL
pH: 7 (ref 5.0–8.0)

## 2012-11-29 NOTE — Progress Notes (Signed)
Pulse: 87 Pt unable to pick up antibiotics, her medicaid is not active and she can't afford medicine.

## 2012-11-29 NOTE — Telephone Encounter (Signed)
Preadmission screen  

## 2012-11-29 NOTE — Progress Notes (Signed)
BPP/AFI scheduled for 11/30/12 at 10:30 am. Induction scheduled for 12/03/12 at 7:30 pm.

## 2012-11-30 ENCOUNTER — Ambulatory Visit (HOSPITAL_COMMUNITY): Payer: Self-pay

## 2012-11-30 ENCOUNTER — Telehealth: Payer: Self-pay | Admitting: *Deleted

## 2012-11-30 ENCOUNTER — Ambulatory Visit (HOSPITAL_COMMUNITY)
Admission: RE | Admit: 2012-11-30 | Discharge: 2012-11-30 | Disposition: A | Discharge status: Home / Self Care | Payer: Medicaid Other | Source: Ambulatory Visit | Attending: Advanced Practice Midwife | Admitting: Advanced Practice Midwife

## 2012-11-30 DIAGNOSIS — O365990 Maternal care for other known or suspected poor fetal growth, unspecified trimester, not applicable or unspecified: Secondary | ICD-10-CM | POA: Insufficient documentation

## 2012-11-30 DIAGNOSIS — O26843 Uterine size-date discrepancy, third trimester: Secondary | ICD-10-CM

## 2012-11-30 NOTE — Telephone Encounter (Signed)
Received call from Radiology that patient had phoned about ultrasound appointment scheduled for today.  I spoke with patient via phone after speaking with Dr. Penne Lash.  Explained to patient we would like for her to have the ultrasound today if possible.  Told her she could come into Radiology as late as 4pm today to be seen.  Patient states she will see how the roads are then and will decide.  I told patient to come to hospital if she experiences any decreased fetal movement.  Patient states understanding.  States again that she will try to come in this afternoon if the roads get better.

## 2012-12-03 ENCOUNTER — Encounter: Payer: Self-pay | Admitting: Advanced Practice Midwife

## 2012-12-03 ENCOUNTER — Encounter (HOSPITAL_COMMUNITY): Payer: Self-pay

## 2012-12-03 ENCOUNTER — Inpatient Hospital Stay (HOSPITAL_COMMUNITY)
Admission: RE | Admit: 2012-12-03 | Discharge: 2012-12-06 | DRG: 775 | Disposition: A | Discharge status: Home / Self Care | Payer: Medicaid Other | Source: Ambulatory Visit | Attending: Obstetrics & Gynecology | Admitting: Obstetrics & Gynecology

## 2012-12-03 VITALS — BP 122/76 | HR 77 | Temp 98.2°F | Resp 18 | Ht 64.0 in | Wt 165.0 lb

## 2012-12-03 DIAGNOSIS — O365990 Maternal care for other known or suspected poor fetal growth, unspecified trimester, not applicable or unspecified: Principal | ICD-10-CM | POA: Diagnosis present

## 2012-12-03 DIAGNOSIS — O26843 Uterine size-date discrepancy, third trimester: Secondary | ICD-10-CM | POA: Insufficient documentation

## 2012-12-03 DIAGNOSIS — Z3483 Encounter for supervision of other normal pregnancy, third trimester: Secondary | ICD-10-CM

## 2012-12-03 DIAGNOSIS — Z22330 Carrier of Group B streptococcus: Secondary | ICD-10-CM

## 2012-12-03 DIAGNOSIS — O99892 Other specified diseases and conditions complicating childbirth: Secondary | ICD-10-CM | POA: Diagnosis present

## 2012-12-03 LAB — CBC
Hemoglobin: 10.3 g/dL — ABNORMAL LOW (ref 12.0–15.0)
Platelets: 304 10*3/uL (ref 150–400)
RBC: 3.43 MIL/uL — ABNORMAL LOW (ref 3.87–5.11)

## 2012-12-03 MED ORDER — HYDROXYZINE HCL 50 MG PO TABS
50.0000 mg | ORAL_TABLET | Freq: Four times a day (QID) | ORAL | Status: DC | PRN
  Administered 2012-12-03: 50 mg via ORAL
  Filled 2012-12-03: qty 1

## 2012-12-03 MED ORDER — OXYTOCIN BOLUS FROM INFUSION: 500.0000 mL | INTRAVENOUS | Status: DC

## 2012-12-03 MED ORDER — OXYTOCIN 40 UNITS IN LACTATED RINGERS INFUSION - SIMPLE MED
62.5000 mL/h | INTRAVENOUS | Status: DC
  Administered 2012-12-05: 999 mL/h via INTRAVENOUS

## 2012-12-03 MED ORDER — CITRIC ACID-SODIUM CITRATE 334-500 MG/5ML PO SOLN: 30.0000 mL | ORAL | Status: DC | PRN

## 2012-12-03 MED ORDER — SODIUM CHLORIDE 0.9 % IJ SOLN: 3.0000 mL | INTRAMUSCULAR | Status: DC | PRN

## 2012-12-03 MED ORDER — OXYCODONE-ACETAMINOPHEN 5-325 MG PO TABS: 1.0000 | ORAL_TABLET | ORAL | Status: DC | PRN

## 2012-12-03 MED ORDER — ACETAMINOPHEN 325 MG PO TABS: 650.0000 mg | ORAL_TABLET | ORAL | Status: DC | PRN

## 2012-12-03 MED ORDER — SODIUM CHLORIDE 0.9 % IJ SOLN: 3.0000 mL | Freq: Two times a day (BID) | INTRAMUSCULAR | Status: DC

## 2012-12-03 MED ORDER — ONDANSETRON HCL 4 MG/2ML IJ SOLN: 4.0000 mg | Freq: Four times a day (QID) | INTRAMUSCULAR | Status: DC | PRN

## 2012-12-03 MED ORDER — NALBUPHINE SYRINGE 5 MG/0.5 ML
5.0000 mg | INJECTION | INTRAMUSCULAR | Status: DC | PRN
  Administered 2012-12-04: 5 mg via INTRAVENOUS
  Filled 2012-12-03: qty 0.5

## 2012-12-03 MED ORDER — LIDOCAINE HCL (PF) 1 % IJ SOLN
30.0000 mL | INTRAMUSCULAR | Status: DC | PRN
  Filled 2012-12-03: qty 30

## 2012-12-03 MED ORDER — ZOLPIDEM TARTRATE 5 MG PO TABS: 5.0000 mg | ORAL_TABLET | Freq: Every evening | ORAL | Status: DC | PRN

## 2012-12-03 MED ORDER — OXYTOCIN 40 UNITS IN LACTATED RINGERS INFUSION - SIMPLE MED
1.0000 m[IU]/min | INTRAVENOUS | Status: DC
  Administered 2012-12-04: 2 m[IU]/min via INTRAVENOUS
  Administered 2012-12-04: 26 m[IU]/min via INTRAVENOUS
  Filled 2012-12-03: qty 1000

## 2012-12-03 MED ORDER — TERBUTALINE SULFATE 1 MG/ML IJ SOLN: 0.2500 mg | Freq: Once | INTRAMUSCULAR | Status: AC | PRN

## 2012-12-03 MED ORDER — PENICILLIN G POTASSIUM 5000000 UNITS IJ SOLR
5.0000 10*6.[IU] | Freq: Once | INTRAMUSCULAR | Status: AC
  Administered 2012-12-04: 5 10*6.[IU] via INTRAVENOUS
  Filled 2012-12-03 (×2): qty 5

## 2012-12-03 MED ORDER — SODIUM CHLORIDE 0.9 % IV SOLN: 250.0000 mL | INTRAVENOUS | Status: DC | PRN

## 2012-12-03 MED ORDER — LACTATED RINGERS IV SOLN
500.0000 mL | INTRAVENOUS | Status: DC | PRN
  Administered 2012-12-04: 1000 mL via INTRAVENOUS
  Administered 2012-12-04: 500 mL via INTRAVENOUS
  Administered 2012-12-04: 1000 mL via INTRAVENOUS

## 2012-12-03 MED ORDER — PENICILLIN G POTASSIUM 5000000 UNITS IJ SOLR
2.5000 10*6.[IU] | INTRAVENOUS | Status: DC
  Administered 2012-12-04 (×4): 2.5 10*6.[IU] via INTRAVENOUS
  Filled 2012-12-03 (×8): qty 2.5

## 2012-12-03 MED ORDER — IBUPROFEN 600 MG PO TABS
600.0000 mg | ORAL_TABLET | Freq: Four times a day (QID) | ORAL | Status: DC | PRN
  Administered 2012-12-05: 600 mg via ORAL
  Filled 2012-12-03: qty 1

## 2012-12-03 NOTE — Progress Notes (Signed)
Foley bulb fell out when patient up to bathroom.

## 2012-12-03 NOTE — Patient Instructions (Addendum)
Labor Induction  Most women go into labor on their own between 37 and 42 weeks of the pregnancy. When this does not happen or when there is a medical need, medicine or other methods may be used to induce labor. Labor induction causes a pregnant woman's uterus to contract. It also causes the cervix to soften (ripen), open (dilate), and thin out (efface). Usually, labor is not induced before 39 weeks of the pregnancy unless there is a problem with the baby or mother. Whether your labor will be induced depends on a number of factors, including the following:  The medical condition of you and the baby.  How many weeks along you are.  The status of baby's lung maturity.  The condition of the cervix.  The position of the baby. REASONS FOR LABOR INDUCTION  The health of the baby or mother is at risk.  The pregnancy is overdue by 1 week or more.  The water breaks but labor does not start on its own.  The mother has a health condition or serious illness such as high blood pressure, infection, placental abruption, or diabetes.  The amniotic fluid amounts are low around the baby.  The baby is distressed. REASONS TO NOT INDUCE LABOR Labor induction may not be a good idea if:  It is shown that your baby does not tolerate labor.  An induction is just more convenient.  You want the baby to be born on a certain date, like a holiday.  You have had previous surgeries on your uterus, such as a myomectomy or the removal of fibroids.  Your placenta lies very low in the uterus and blocks the opening of the cervix (placenta previa).  Your baby is not in a head down position.  The umbilical cord drops down into the birth canal in front of the baby. This could cut off the baby's blood and oxygen supply.  You have had a previous cesarean delivery.  There areunusual circumstances, such as the baby being extremely premature. RISKS AND COMPLICATIONS Problems may occur in the process of induction  and plans may need to be modified as a situation unfolds. Some of the risks of induction include:  Change in fetal heart rate, such as too high, too low, or erratic.  Risk of fetal distress.  Risk of infection to mother and baby.  Increased chance of having a cesarean delivery.  The rare, but increased chance that the placenta will separate from the uterus (abruption).  Uterine rupture (very rare). When induction is needed for medical reasons, the benefits of induction may outweigh the risks. BEFORE THE PROCEDURE Your caregiver will check your cervix and the baby's position. This will help your caregiver decide if you are far enough along for an induction to work. PROCEDURE Several methods of labor induction may be used, such as:   Taking prostaglandin medicine to dilate and ripen the cervix. The medicine will also start contractions. It can be taken by mouth or by inserting a suppository into the vagina.  A thin tube (catheter) with a balloon on the end may be inserted into your vagina to dilate the cervix. Once inserted, the balloon expands with water, which causes the cervix to open.  Striping the membranes. Your caregiver inserts a finger between the cervix and membranes, which causes the cervix to be stretched and may cause the uterus to contract. This is often done during an office visit. You will be sent home to wait for the contractions to begin. You will   then come in for an induction.  Breaking the water. Your caregiver will make a hole in the amniotic sac using a small instrument. Once the amniotic sac breaks, contractions should begin. This may still take hours to see an effect.  Taking medicine to trigger or strengthen contractions. This medicine is given intravenously through a tube in your arm. All of the methods of induction, besides stripping the membranes, will be done in the hospital. Induction is done in the hospital so that you and the baby can be carefully  monitored. AFTER THE PROCEDURE Some inductions can take up to 2 or 3 days. Depending on the cervix, it usually takes less time. It takes longer when you are induced early in the pregnancy or if this is your first pregnancy. If a mother is still pregnant and the induction has been going on for 2 to 3 days, either the mother will be sent home or a cesarean delivery will be needed. Document Released: 02/01/2007 Document Revised: 12/05/2011 Document Reviewed: 07/18/2011 Exit[employer] Patient Information 2013 [employer].  

## 2012-12-03 NOTE — H&P (Signed)
Leah Rojas is a 22 y.o. female presenting for IOL 2/2 IUGR. Maternal Medical History:  Reason for admission: Nausea. 22 y/o G2P0011 at 40+1 w/ hx of asthma being admitted for IOL 2/2/ IUGR  IOL: Pt being admitted for induction of labor 2/2 IUGR concerns on prior u/s.  Pt mentions continued fetal movement, denies fluid leakage or discharge or blood.    OB History   Grav Para Term Preterm Abortions TAB SAB Ect Mult Living   2 0 0 0 1 0 1 0 0 0      Past Medical History  Diagnosis Date  . Bronchitis     rescue inhaler prn   Past Surgical History  Procedure Laterality Date  . No past surgeries     Family History: family history includes Mental retardation in her cousin.  There is no history of Other. Social History:  reports that she quit smoking about 8 months ago. She has never used smokeless tobacco. She reports that she does not drink alcohol or use illicit drugs.   Prenatal Transfer Tool  Maternal Diabetes: No Genetic Screening: Declined Maternal Ultrasounds/Referrals: Normal Fetal Ultrasounds or other Referrals:  None Maternal Substance Abuse:  No Significant Maternal Medications:  None Significant Maternal Lab Results:  Lab values include: Group B Strep positive Other Comments:  Induction for concern of IUGR.  U/S 11/26/12 GA of [redacted]w[redacted]d w/ EGA by u/s of [redacted]w[redacted]d at 21st % for assigned GA.  Fetal head <3rd%, fetal abdominal circumference 12th% for GA  Review of Systems  Constitutional: Negative for fever and chills.  HENT: Negative for congestion.   Eyes: Negative for blurred vision and double vision.  Respiratory: Negative for cough and wheezing.   Cardiovascular: Negative for chest pain and palpitations.  Gastrointestinal: Negative for nausea, vomiting, abdominal pain, diarrhea and constipation.  Genitourinary: Negative for dysuria, urgency and frequency.  Musculoskeletal: Negative for joint pain.  Skin: Negative for itching and rash.  Neurological: Negative for  dizziness, tingling and headaches.  Endo/Heme/Allergies: Does not bruise/bleed easily.  All other systems reviewed and are negative.    Dilation: 1 Effacement (%): Thick Station: -3 Exam by:: Linderman MD Blood pressure 127/54, pulse 111, temperature 98 F (36.7 C), temperature source Oral, resp. rate 18, height 5\' 4"  (1.626 m), weight 74.844 kg (165 lb), last menstrual period 07/14/2011. Maternal Exam:  Uterine Assessment: Contraction strength is mild.  Contraction duration is 15 minutes. Contraction frequency is irregular.   Abdomen: Fetal presentation: vertex  Introitus: Normal vulva. Normal vagina.  Ferning test: not done.  Nitrazine test: not done. Amniotic fluid character: not assessed.  Cervix: Cervix evaluated by digital exam.     Fetal Exam Fetal Monitor Review: Mode: ultrasound.   Baseline rate: 140.  Variability: moderate (6-25 bpm).   Pattern: accelerations present.    Fetal State Assessment: Category I - tracings are normal.     Physical Exam  Nursing note and vitals reviewed. Constitutional: She is oriented to person, place, and time. She appears well-developed and well-nourished.  HENT:  Head: Normocephalic and atraumatic.  Eyes: Conjunctivae are normal. Pupils are equal, round, and reactive to light.  Neck: Normal range of motion.  Cardiovascular: Normal rate, regular rhythm and normal heart sounds.   Respiratory: Effort normal. No respiratory distress. She has wheezes. She has no rales.  GI: Soft. Bowel sounds are normal. She exhibits no distension. There is no tenderness.  Genitourinary: Vagina normal and uterus normal.  Musculoskeletal: Normal range of motion. She exhibits no edema.  Neurological:  She is alert and oriented to person, place, and time.  Skin: Skin is warm.  Psychiatric: She has a normal mood and affect. Her behavior is normal. Thought content normal.    Prenatal labs: ABO, Rh: --/--/A POS (12/19 2348) Antibody: Negative (07/30  0000) Rubella:  unknown  RPR: Nonreactive (07/30 0000)  HBsAg: Negative (07/30 0000)  HIV: Non-reactive (11/22 0000)  GBS: Positive (02/27 0000)   Assessment/Plan: Leah Rojas is a 22 y.o. Female w/ hx of asthma presenting for IOL 2/2 IUGR.   # IOL:  -- foley bulb placed -- pitocin at 0630 on 03/06/13 -- routine care per floor protocol   # GBS + -- penicillin until delivery   # Asthma -- albuterol inhaler prn   DISPO: Diet: thin  Activity: up ab lib Nursing: Per floor protocol Electrolytes: replace as needed Prophylaxis: GI: None; DVT ambulation Code: Full  Ancipitate the pt to be admitted for 2 midnight(s) for IOL 2/2 IUGR.     Gregor Hams 12/03/2012, 9:52 PM  .I have seen the patient with the resident/student and agree with the above.  Tawnya Crook

## 2012-12-03 NOTE — Progress Notes (Signed)
Growth [place] EFW 21% 6-5 lb. Will do BPP tomorrow. Pt strongly requesting IOL due to concerns about fetal growth and status and family in town. Will schedule to 40 weeks. Discussed 21% EFW still considered normal. Urien Culture 5,000 GBS. Amox Rx'd. Could not afford due to Medicaid not active in [place] yet. Per CDC guidelines, will not Tx for UTI, but will give prophylaxis in labor.

## 2012-12-04 ENCOUNTER — Encounter (HOSPITAL_COMMUNITY): Payer: Self-pay | Admitting: Anesthesiology

## 2012-12-04 ENCOUNTER — Inpatient Hospital Stay (HOSPITAL_COMMUNITY): Payer: Medicaid Other | Admitting: Anesthesiology

## 2012-12-04 DIAGNOSIS — O9989 Other specified diseases and conditions complicating pregnancy, childbirth and the puerperium: Secondary | ICD-10-CM

## 2012-12-04 DIAGNOSIS — O365990 Maternal care for other known or suspected poor fetal growth, unspecified trimester, not applicable or unspecified: Secondary | ICD-10-CM

## 2012-12-04 LAB — RPR: RPR Ser Ql: NONREACTIVE

## 2012-12-04 MED ORDER — PHENYLEPHRINE 40 MCG/ML (10ML) SYRINGE FOR IV PUSH (FOR BLOOD PRESSURE SUPPORT): 80.0000 ug | PREFILLED_SYRINGE | INTRAVENOUS | Status: DC | PRN

## 2012-12-04 MED ORDER — LACTATED RINGERS IV SOLN
INTRAVENOUS | Status: DC
  Administered 2012-12-04: 21:00:00 via INTRAVENOUS

## 2012-12-04 MED ORDER — FENTANYL 2.5 MCG/ML BUPIVACAINE 1/10 % EPIDURAL INFUSION (WH - ANES)
14.0000 mL/h | INTRAMUSCULAR | Status: DC | PRN
  Administered 2012-12-04: 14 mL/h via EPIDURAL
  Filled 2012-12-04: qty 125

## 2012-12-04 MED ORDER — PHENYLEPHRINE 40 MCG/ML (10ML) SYRINGE FOR IV PUSH (FOR BLOOD PRESSURE SUPPORT)
80.0000 ug | PREFILLED_SYRINGE | INTRAVENOUS | Status: DC | PRN
  Filled 2012-12-04: qty 5

## 2012-12-04 MED ORDER — LACTATED RINGERS IV SOLN: 500.0000 mL | Freq: Once | INTRAVENOUS | Status: DC

## 2012-12-04 MED ORDER — ALBUTEROL SULFATE HFA 108 (90 BASE) MCG/ACT IN AERS
2.0000 | INHALATION_SPRAY | Freq: Four times a day (QID) | RESPIRATORY_TRACT | Status: DC | PRN
  Filled 2012-12-04: qty 6.7

## 2012-12-04 MED ORDER — EPHEDRINE 5 MG/ML INJ: 10.0000 mg | INTRAVENOUS | Status: DC | PRN

## 2012-12-04 MED ORDER — LIDOCAINE HCL (PF) 1 % IJ SOLN
INTRAMUSCULAR | Status: DC | PRN
  Administered 2012-12-04 (×2): 5 mL

## 2012-12-04 MED ORDER — EPHEDRINE 5 MG/ML INJ
10.0000 mg | INTRAVENOUS | Status: DC | PRN
  Filled 2012-12-04: qty 4

## 2012-12-04 MED ORDER — DIPHENHYDRAMINE HCL 50 MG/ML IJ SOLN: 12.5000 mg | INTRAMUSCULAR | Status: DC | PRN

## 2012-12-04 NOTE — Progress Notes (Signed)
I was present for the exam and agree with above.  [place], CNM 12/04/2012 10:27 AM

## 2012-12-04 NOTE — Progress Notes (Signed)
Leah Rojas is a 22 y.o. G2P0010 at [redacted]w[redacted]d by LMP admitted for induction of labor due to Poor fetal growth.  Subjective: Comfortable, not feeling contractions much, no complaints   Objective: BP 125/50  Pulse 110  Temp(Src) 98.6 F (37 C) (Oral)  Resp 18  Ht 5\' 4"  (1.626 m)  Wt 74.844 kg (165 lb)  BMI 28.31 kg/m2  LMP 07/14/2011      FHT:  FHR: 145 bpm, variability: moderate,  accelerations:  Abscent,  decelerations:  Absent UC:   irreg 3-5 minutes SVE:   Not examined 2/2 foley bulb in place U/S: Fetus is in vertex position   Pitocin: 8 milliunits/min  Labs: Lab Results  Component Value Date   WBC 12.2* 12/03/2012   HGB 10.3* 12/03/2012   HCT 30.6* 12/03/2012   MCV 89.2 12/03/2012   PLT 304 12/03/2012    Assessment / Plan: Induction of labor due to IUGR,  progressing well on pitocin and foley bulb  Labor: Progressing normally continue foley bulb, continue to titrate pitocin Fetal Wellbeing:  Category I Pain Control:  Labor support without medications I/D:  n/a Anticipated MOD:  NSVD  Kevin Fenton 12/04/2012, 10:00 AM

## 2012-12-04 NOTE — Progress Notes (Signed)
Yariela Tison is a 22 y.o. G2P0010 at [redacted]w[redacted]d by LMP admitted for induction of labor due to Poor fetal growth.  Subjective: Pt sleeping currently.    Objective: BP 118/58  Pulse 97  Temp(Src) 97.9 F (36.6 C) (Axillary)  Resp 20  Ht 5\' 4"  (1.626 m)  Wt 74.844 kg (165 lb)  BMI 28.31 kg/m2  LMP 07/14/2011      FHT:  FHR: 135 bpm, variability: moderate,  accelerations:  Present,  decelerations:  Absent UC:   none SVE:   Dilation: 1.5 Effacement (%): 60 Station: -2 Exam by:: Mathews Robinsons, CNM  Labs: Lab Results  Component Value Date   WBC 12.2* 12/03/2012   HGB 10.3* 12/03/2012   HCT 30.6* 12/03/2012   MCV 89.2 12/03/2012   PLT 304 12/03/2012    Assessment / Plan: Induction of labor due to IUGR,  progressing well on pitocin  Labor: Progressing normally continue foley bulb, plan for pitocin at 0630, plan for cervical check in am Fetal Wellbeing:  Category I Pain Control:  Labor support without medications I/D:  n/a Anticipated MOD:  NSVD  Gregor Hams 12/04/2012, 1:16 AM

## 2012-12-04 NOTE — Progress Notes (Signed)
Subjective: Beginning to feel contractions stronger with increased desire to push and have a bowel movement.  Epidural is controlling pain well.     Objective: BP 114/78  Pulse 88  Temp(Src) 97.9 F (36.6 C) (Oral)  Resp 16  Ht 5\' 4"  (1.626 m)  Wt 74.844 kg (165 lb)  BMI 28.31 kg/m2  SpO2 94%  LMP 07/14/2011      FHT:  FHR: 140 bpm, variability: moderate,  accelerations:  Abscent,  decelerations:  Present Occasional variables UC:   irreg 2-4 minutes SVE:   Dilation: 10 Effacement (%): 100 Cervical Position: Anterior Station: 0 Presentation: Vertex Exam by:: Gregor Hams  U/S: Fetus is in vertex position   Pitocin: 12 milliunits/min  Labs: Lab Results  Component Value Date   WBC 12.2* 12/03/2012   HGB 10.3* 12/03/2012   HCT 30.6* 12/03/2012   MCV 89.2 12/03/2012   PLT 304 12/03/2012    Assessment / Plan: Induction of labor due to IUGR,  progressing well on pitocin, plan to begin pushing in 30 minutes  Labor: Progressing normally, continue to titrate pitocin Fetal Wellbeing:  Category II and still with good variability Pain Control:  Labor support without medications and Epidural I/D:  n/a Anticipated MOD:  NSVD  Gregor Hams 12/04/2012, 11:01 PM

## 2012-12-04 NOTE — Progress Notes (Signed)
Leah Rojas is a 22 y.o. G2P0010 at [redacted]w[redacted]d by LMP admitted for induction of labor due to Poor fetal growth.  Subjective: Beginning to feel contractions stronger.  Pt was being positioned for epidural placement.     Objective: BP 119/67  Pulse 70  Temp(Src) 97.7 F (36.5 C) (Oral)  Resp 20  Ht 5\' 4"  (1.626 m)  Wt 74.844 kg (165 lb)  BMI 28.31 kg/m2  LMP 07/14/2011      FHT:  FHR: 140 bpm, variability: moderate,  accelerations:  Abscent,  decelerations:  Present Occasional variables UC:   irreg 2-4 minutes SVE:   Dilation: 5 Effacement (%): 70 Cervical Position: Anterior Station: -1 Presentation: Vertex Exam by:: Dorathy Kinsman, CNM  U/S: Fetus is in vertex position   Pitocin: 12 milliunits/min  Labs: Lab Results  Component Value Date   WBC 12.2* 12/03/2012   HGB 10.3* 12/03/2012   HCT 30.6* 12/03/2012   MCV 89.2 12/03/2012   PLT 304 12/03/2012    Assessment / Plan: Induction of labor due to IUGR,  progressing well on pitocin  Labor: Progressing normally, continue to titrate pitocin Fetal Wellbeing:  Category II and still with good variability Pain Control:  Labor support without medications and Epidural I/D:  n/a Anticipated MOD:  NSVD  Gregor Hams 12/04/2012, 8:49 PM

## 2012-12-04 NOTE — Anesthesia Procedure Notes (Signed)
Epidural Patient location during procedure: OB Start time: 12/04/2012 8:57 PM  Staffing Anesthesiologist: Angus Seller., Harrell Gave. Performed by: anesthesiologist   Preanesthetic Checklist Completed: patient identified, site marked, surgical consent, pre-op evaluation, timeout performed, IV checked, risks and benefits discussed and monitors and equipment checked  Epidural Patient position: sitting Prep: site prepped and draped and DuraPrep Patient monitoring: continuous pulse ox and blood pressure Approach: midline Injection technique: LOR air and LOR saline  Needle:  Needle type: Tuohy  Needle gauge: 17 G Needle length: 9 cm and 9 Needle insertion depth: 6 cm Catheter type: closed end flexible Catheter size: 19 Gauge Catheter at skin depth: 11 cm Test dose: negative  Assessment Events: blood not aspirated, injection not painful, no injection resistance, negative IV test and no paresthesia  Additional Notes Patient identified.  Risk benefits discussed including failed block, incomplete pain control, headache, nerve damage, paralysis, blood pressure changes, nausea, vomiting, reactions to medication both toxic or allergic, and postpartum back pain.  Patient expressed understanding and wished to proceed.  All questions were answered.  Sterile technique used throughout procedure and epidural site dressed with sterile barrier dressing. No paresthesia or other complications noted.The patient did not experience any signs of intravascular injection such as tinnitus or metallic taste in mouth nor signs of intrathecal spread such as rapid motor block. Please see nursing notes for vital signs.

## 2012-12-04 NOTE — Progress Notes (Signed)
Knox Holdman is a 22 y.o. G2P0010 at [redacted]w[redacted]d by LMP admitted for induction of labor due to Poor fetal growth.  Subjective: Beginning to feel contractions stronger now but not severe in her opinion.   Objective: BP 126/67  Pulse 86  Temp(Src) 98.1 F (36.7 C) (Oral)  Resp 20  Ht 5\' 4"  (1.626 m)  Wt 74.844 kg (165 lb)  BMI 28.31 kg/m2  LMP 07/14/2011      FHT:  FHR: 140 bpm, variability: moderate,  accelerations:  Abscent,  decelerations:  Present Occasional variables UC:   irreg 2-4 minutes SVE:   Dilation: 3.5 Effacement (%): 80 Cervical Position: Anterior Station: -2 Presentation: Vertex Exam by:: Dr. Ermalinda Memos  U/S: Fetus is in vertex position   Pitocin: 12 milliunits/min  Labs: Lab Results  Component Value Date   WBC 12.2* 12/03/2012   HGB 10.3* 12/03/2012   HCT 30.6* 12/03/2012   MCV 89.2 12/03/2012   PLT 304 12/03/2012    Assessment / Plan: Induction of labor due to IUGR,  progressing well on pitocin and foley bulb  Labor: Progressing normally continue foley bulb, continue to titrate pitocin Fetal Wellbeing:  Category II and still with good variability Pain Control:  Labor support without medications I/D:  n/a Anticipated MOD:  NSVD  Kevin Fenton 12/04/2012, 12:28 PM

## 2012-12-04 NOTE — Progress Notes (Signed)
Leah Rojas is a 22 y.o. G2P0010 at [redacted]w[redacted]d by LMP admitted for induction of labor due to Poor fetal growth.  Subjective: Pt mentions continued fetal movement, no significant contractions, no blood or fluid leakage.  No other concerns or complaints.   Objective: BP 124/77  Pulse 95  Temp(Src) 97.8 F (36.6 C) (Oral)  Resp 20  Ht 5\' 4"  (1.626 m)  Wt 74.844 kg (165 lb)  BMI 28.31 kg/m2  LMP 07/14/2011      FHT:  FHR: 135 bpm, variability: moderate,  accelerations:  Present,  decelerations:  Absent UC:   none SVE:   Not examined 2/2 foley bulb in place U/S: Fetus is in vertex position   Labs: Lab Results  Component Value Date   WBC 12.2* 12/03/2012   HGB 10.3* 12/03/2012   HCT 30.6* 12/03/2012   MCV 89.2 12/03/2012   PLT 304 12/03/2012    Assessment / Plan: Induction of labor due to IUGR,  progressing well on pitocin  Labor: Progressing normally continue foley bulb, plan for pitocin at 0630, plan for cervical check when foley bulb falls out of place Fetal Wellbeing:  Category I Pain Control:  Labor support without medications I/D:  n/a Anticipated MOD:  NSVD  Gregor Hams 12/04/2012, 6:25 AM

## 2012-12-04 NOTE — Anesthesia Preprocedure Evaluation (Signed)
Anesthesia Evaluation  Patient identified by MRN, date of birth, ID band Patient awake    Reviewed: Allergy & Precautions, H&P , Patient's Chart, lab work & pertinent test results  Airway Mallampati: II TM Distance: >3 FB Neck ROM: full    Dental no notable dental hx.    Pulmonary COPD breath sounds clear to auscultation  Pulmonary exam normal + wheezing      Cardiovascular negative cardio ROS  Rhythm:regular Rate:Normal     Neuro/Psych negative neurological ROS  negative psych ROS   GI/Hepatic negative GI ROS, Neg liver ROS,   Endo/Other  negative endocrine ROS  Renal/GU negative Renal ROS     Musculoskeletal   Abdominal   Peds  Hematology negative hematology ROS (+)   Anesthesia Other Findings   Reproductive/Obstetrics (+) Pregnancy                           Anesthesia Physical Anesthesia Plan  ASA: II  Anesthesia Plan: Epidural   Post-op Pain Management:    Induction:   Airway Management Planned:   Additional Equipment:   Intra-op Plan:   Post-operative Plan:   Informed Consent: I have reviewed the patients History and Physical, chart, labs and discussed the procedure including the risks, benefits and alternatives for the proposed anesthesia with the patient or authorized representative who has indicated his/her understanding and acceptance.     Plan Discussed with:   Anesthesia Plan Comments:         Anesthesia Quick Evaluation

## 2012-12-04 NOTE — Progress Notes (Signed)
I was present for the exam and agree with above. Foley bulb still in place. Pt starting to feel contractions. Increase pitocin by 2s.   [place], CNM 12/04/2012 1:22 PM

## 2012-12-05 ENCOUNTER — Encounter (HOSPITAL_COMMUNITY): Payer: Self-pay

## 2012-12-05 ENCOUNTER — Encounter: Payer: Medicaid - Out of State | Admitting: Advanced Practice Midwife

## 2012-12-05 LAB — CBC
Hemoglobin: 10.2 g/dL — ABNORMAL LOW (ref 12.0–15.0)
MCH: 29.7 pg (ref 26.0–34.0)
RBC: 3.43 MIL/uL — ABNORMAL LOW (ref 3.87–5.11)
WBC: 21 10*3/uL — ABNORMAL HIGH (ref 4.0–10.5)

## 2012-12-05 MED ORDER — OXYCODONE-ACETAMINOPHEN 5-325 MG PO TABS
1.0000 | ORAL_TABLET | ORAL | Status: DC | PRN
  Administered 2012-12-05 (×3): 1 via ORAL
  Administered 2012-12-06: 2 via ORAL
  Filled 2012-12-05 (×2): qty 1
  Filled 2012-12-05: qty 2
  Filled 2012-12-05 (×2): qty 1

## 2012-12-05 MED ORDER — INFLUENZA VIRUS VACC SPLIT PF IM SUSP: 0.5000 mL | INTRAMUSCULAR | Status: DC

## 2012-12-05 MED ORDER — LORATADINE 10 MG PO TABS
10.0000 mg | ORAL_TABLET | Freq: Every day | ORAL | Status: DC | PRN
  Filled 2012-12-05: qty 1

## 2012-12-05 MED ORDER — IBUPROFEN 600 MG PO TABS
600.0000 mg | ORAL_TABLET | Freq: Four times a day (QID) | ORAL | Status: DC
  Administered 2012-12-05 – 2012-12-06 (×5): 600 mg via ORAL
  Filled 2012-12-05 (×5): qty 1

## 2012-12-05 MED ORDER — ONDANSETRON HCL 4 MG/2ML IJ SOLN: 4.0000 mg | INTRAMUSCULAR | Status: DC | PRN

## 2012-12-05 MED ORDER — ZOLPIDEM TARTRATE 5 MG PO TABS: 5.0000 mg | ORAL_TABLET | Freq: Every evening | ORAL | Status: DC | PRN

## 2012-12-05 MED ORDER — SENNOSIDES-DOCUSATE SODIUM 8.6-50 MG PO TABS
2.0000 | ORAL_TABLET | Freq: Every day | ORAL | Status: DC
  Administered 2012-12-05: 2 via ORAL

## 2012-12-05 MED ORDER — SIMETHICONE 80 MG PO CHEW: 80.0000 mg | CHEWABLE_TABLET | ORAL | Status: DC | PRN

## 2012-12-05 MED ORDER — TETANUS-DIPHTH-ACELL PERTUSSIS 5-2.5-18.5 LF-MCG/0.5 IM SUSP
0.5000 mL | Freq: Once | INTRAMUSCULAR | Status: AC
  Administered 2012-12-06: 0.5 mL via INTRAMUSCULAR
  Filled 2012-12-05: qty 0.5

## 2012-12-05 MED ORDER — DIPHENHYDRAMINE HCL 25 MG PO CAPS: 25.0000 mg | ORAL_CAPSULE | Freq: Four times a day (QID) | ORAL | Status: DC | PRN

## 2012-12-05 MED ORDER — ONDANSETRON HCL 4 MG PO TABS: 4.0000 mg | ORAL_TABLET | ORAL | Status: DC | PRN

## 2012-12-05 MED ORDER — DIBUCAINE 1 % RE OINT: 1.0000 "application " | TOPICAL_OINTMENT | RECTAL | Status: DC | PRN

## 2012-12-05 MED ORDER — PRENATAL MULTIVITAMIN CH
1.0000 | ORAL_TABLET | Freq: Every day | ORAL | Status: DC
  Administered 2012-12-05 – 2012-12-06 (×2): 1 via ORAL
  Filled 2012-12-05 (×2): qty 1

## 2012-12-05 MED ORDER — BENZOCAINE-MENTHOL 20-0.5 % EX AERO
1.0000 "application " | INHALATION_SPRAY | CUTANEOUS | Status: DC | PRN
  Administered 2012-12-06: 1 via TOPICAL
  Filled 2012-12-05: qty 56

## 2012-12-05 MED ORDER — LANOLIN HYDROUS EX OINT: TOPICAL_OINTMENT | CUTANEOUS | Status: DC | PRN

## 2012-12-05 MED ORDER — WITCH HAZEL-GLYCERIN EX PADS: 1.0000 "application " | MEDICATED_PAD | CUTANEOUS | Status: DC | PRN

## 2012-12-05 NOTE — Progress Notes (Signed)
UR chart review completed.  

## 2012-12-05 NOTE — Anesthesia Postprocedure Evaluation (Signed)
Anesthesia Post Note  Patient: Leah Rojas  Procedure(s) Performed: * No procedures listed *  Anesthesia type: Epidural  Patient location: Mother/Baby  Post pain: Pain level controlled  Post assessment: Post-op Vital signs reviewed  Last Vitals:  Filed Vitals:   12/05/12 0634  BP: 118/68  Pulse: 88  Temp: 36.7 C  Resp: 20    Post vital signs: Reviewed  Level of consciousness: awake  Complications: No apparent anesthesia complications

## 2012-12-06 MED ORDER — IBUPROFEN 200 MG PO TABS: 200.0000 mg | ORAL_TABLET | Freq: Four times a day (QID) | ORAL | Status: DC | PRN

## 2012-12-06 MED ORDER — IBUPROFEN 200 MG PO TABS: 600.0000 mg | ORAL_TABLET | Freq: Four times a day (QID) | ORAL | Status: DC | PRN

## 2012-12-06 MED ORDER — ALBUTEROL SULFATE HFA 108 (90 BASE) MCG/ACT IN AERS
2.0000 | INHALATION_SPRAY | Freq: Four times a day (QID) | RESPIRATORY_TRACT | Status: DC | PRN
Start: 2012-12-06 — End: 2012-12-06
  Administered 2012-12-06: 2 via RESPIRATORY_TRACT
  Filled 2012-12-06: qty 6.7

## 2012-12-06 MED ORDER — ALBUTEROL SULFATE HFA 108 (90 BASE) MCG/ACT IN AERS: 2.0000 | INHALATION_SPRAY | Freq: Four times a day (QID) | RESPIRATORY_TRACT | Status: DC | PRN

## 2012-12-06 MED ORDER — MEASLES, MUMPS & RUBELLA VAC ~~LOC~~ INJ
0.5000 mL | INJECTION | Freq: Once | SUBCUTANEOUS | Status: AC
  Administered 2012-12-06: 0.5 mL via SUBCUTANEOUS
  Filled 2012-12-06: qty 0.5

## 2012-12-06 MED ORDER — PRENATAL MULTIVITAMIN CH: 1.0000 | ORAL_TABLET | Freq: Every day | ORAL | Status: DC

## 2012-12-06 NOTE — Discharge Summary (Signed)
Obstetric Discharge Summary Reason for Admission: induction of labor Prenatal Procedures: NST Intrapartum Procedures: spontaneous vaginal delivery Postpartum Procedures: none Complications-Operative and Postpartum: 1 degree perineal laceration Hemoglobin  Date Value Range Status  12/05/2012 10.2* 12.0 - 15.0 g/dL Final  16/06/9603 54.0   Final     HCT  Date Value Range Status  12/05/2012 30.6* 36.0 - 46.0 % Final  08/17/2012 33   Final  Hospital Course: Leah Rojas is a 21 y.o. G2P0010 at [redacted]w[redacted]d by LMP admitted for induction of labor due to Poor fetal growth. At 0007 ths 22 y/o G1 now P1001 delivered a viable female infant over an intact perineum by NSVD with APGARs at 8 and 9. Pt's pain was managed epidural. Infant was placed on mom's chest after delivery and cord clamping was delayed till pulsation and stopped within the cord. Cord was clamped and Dad cut the cord. Cord blood was collected and sent. Intact 3 vessel cord placenta were delivered spontaneiously. Cervix and vagina were inspected. 1st degree laceration was repaired with 3.0 vicryl rapide. EBL 250. Pt stable to mother baby and baby to [facility]. Delivery supervised by Caren Griffins and preformed by Gregor Hams, DO .   Has done well postpartum with exam below.   Physical Exam:  General: alert, cooperative and appears stated age Lochia: appropriate Uterine Fundus: firm DVT Evaluation: No evidence of DVT seen on physical exam. No cords or calf tenderness.  Discharge Diagnoses: Term Pregnancy-delivered  Discharge Information: Date: 12/06/2012 Activity: pelvic rest Diet: routine Medications: Ibuprofen Condition: stable Instructions: refer to practice specific booklet Discharge to: home Follow-up Information   Follow up with [facility] In 3 days. (Follow up for depo injection for birth control )       Newborn Data: Live born female  Birth Weight: 6 lb 7 oz (2920 g) APGAR: 8, 9  Home with  mother.  Gregor Hams 12/06/2012, 8:59 AM  Seen and agree with note Wynelle Bourgeois CNM

## 2012-12-12 NOTE — Discharge Summary (Signed)
Attestation of Attending Supervision of Advanced Practitioner (CNM/NP): Evaluation and management procedures were performed by the Advanced Practitioner under my supervision and collaboration.  I have reviewed the Advanced Practitioner's note and chart, and I agree with the management and plan.  HARRAWAY-SMITH, CAROLYN 9:50 AM

## 2013-01-02 ENCOUNTER — Ambulatory Visit: Payer: Self-pay | Admitting: Advanced Practice Midwife

## 2013-01-24 ENCOUNTER — Ambulatory Visit: Payer: Self-pay | Admitting: Obstetrics & Gynecology

## 2013-05-20 ENCOUNTER — Other Ambulatory Visit (HOSPITAL_COMMUNITY)
Admission: RE | Admit: 2013-05-20 | Discharge: 2013-05-20 | Disposition: A | Discharge status: Home / Self Care | Payer: Medicaid Other | Source: Ambulatory Visit | Attending: Obstetrics and Gynecology | Admitting: Obstetrics and Gynecology

## 2013-05-20 ENCOUNTER — Encounter: Payer: Self-pay | Admitting: Obstetrics and Gynecology

## 2013-05-20 ENCOUNTER — Ambulatory Visit (INDEPENDENT_AMBULATORY_CARE_PROVIDER_SITE_OTHER): Payer: Medicaid Other | Admitting: Obstetrics and Gynecology

## 2013-05-20 VITALS — BP 111/72 | HR 71 | Temp 97.6°F | Ht 62.0 in | Wt 146.6 lb

## 2013-05-20 DIAGNOSIS — N898 Other specified noninflammatory disorders of vagina: Secondary | ICD-10-CM

## 2013-05-20 DIAGNOSIS — F17200 Nicotine dependence, unspecified, uncomplicated: Secondary | ICD-10-CM | POA: Insufficient documentation

## 2013-05-20 DIAGNOSIS — N72 Inflammatory disease of cervix uteri: Secondary | ICD-10-CM

## 2013-05-20 DIAGNOSIS — Z01812 Encounter for preprocedural laboratory examination: Secondary | ICD-10-CM

## 2013-05-20 DIAGNOSIS — Z113 Encounter for screening for infections with a predominantly sexual mode of transmission: Secondary | ICD-10-CM | POA: Insufficient documentation

## 2013-05-20 DIAGNOSIS — Z01419 Encounter for gynecological examination (general) (routine) without abnormal findings: Secondary | ICD-10-CM

## 2013-05-20 DIAGNOSIS — Z3049 Encounter for surveillance of other contraceptives: Secondary | ICD-10-CM

## 2013-05-20 DIAGNOSIS — Z3042 Encounter for surveillance of injectable contraceptive: Secondary | ICD-10-CM

## 2013-05-20 LAB — POCT PREGNANCY, URINE: Preg Test, Ur: NEGATIVE

## 2013-05-20 MED ORDER — MEDROXYPROGESTERONE ACETATE 150 MG/ML IM SUSP: 150.0000 mg | INTRAMUSCULAR | Status: DC

## 2013-05-20 MED ORDER — MEDROXYPROGESTERONE ACETATE 150 MG/ML IM SUSP
150.0000 mg | INTRAMUSCULAR | Status: DC
  Administered 2013-05-20: 150 mg via INTRAMUSCULAR

## 2013-05-20 NOTE — Patient Instructions (Addendum)
                                                                                                                                                                                                                                                                                                                                                                                                                                                                                                                                                                                                                                                                                                                                                                                                                                                                                                                                                                                                                                                                                                                                                                                                                                                                                                                                       +                                                                                                                                                                                                                                                                                                                              ........................................................................................   Health Maintenance, Females A healthy lifestyle and preventative care can promote health and wellness.  Maintain regular health, dental, and eye exams.  Eat a healthy diet. Foods like vegetables, fruits, whole grains, low-fat dairy products, and lean protein foods contain the nutrients you need without too many calories. Decrease your intake of foods high in solid fats, added sugars, and salt. Get information about a proper diet from your caregiver, if necessary.  Regular physical exercise is one of the most important things you can do for your health. Most adults should get at least  150 minutes of moderate-intensity exercise (any activity that increases your heart rate and causes you to sweat) each week. In addition, most adults need muscle-strengthening exercises on 2 or more days a week.   Maintain a healthy weight. The body mass index (BMI) is a screening tool to identify possible weight problems. It provides an estimate of body fat based on height and weight. Your caregiver can help determine your BMI, and can help you achieve or maintain a healthy weight. For adults 20 years and older:  A BMI below 18.5 is considered underweight.  A BMI of 18.5 to 24.9 is normal.  A BMI of 25 to 29.9 is considered overweight.  A BMI of 30 and above is considered obese.  Maintain normal blood lipids and cholesterol by exercising and minimizing your intake of saturated fat. Eat a balanced diet with plenty of fruits and vegetables. Blood tests for lipids and cholesterol should begin at age 32 and be repeated every 5 years. If your lipid or cholesterol levels are high, you are over 50, or you are a high risk for heart disease, you may need your cholesterol levels checked more frequently.Ongoing high lipid and cholesterol levels should be treated with medicines if diet and exercise are not effective.  If you smoke, find out from your caregiver how to quit. If you do not use tobacco, do not start.  If you are pregnant, do not drink alcohol. If you are breastfeeding, be very cautious about drinking alcohol. If you are not pregnant and choose to drink alcohol, do not exceed 1 drink per day. One drink is considered to be 12 ounces (355 mL) of beer, 5 ounces (148 mL) of wine, or 1.5 ounces (44 mL) of liquor.  Avoid use of street drugs. Do not share needles with anyone. Ask for help if you need support or instructions about stopping the use of drugs.  High blood pressure causes heart disease and increases the risk of stroke. Blood pressure should be checked at least every 1 to 2 years. Ongoing  high blood pressure should be treated with medicines, if weight loss and exercise are not effective.  If you are 64 to 23 years old, ask your caregiver if you should take aspirin to prevent strokes.  Diabetes screening involves taking a blood sample to check your fasting blood sugar level. This should be done once every 3 years, after age 48, if you are within normal weight and without risk factors for diabetes. Testing should be considered at a younger age or be carried out more frequently if you are overweight and have at least 1 risk factor for diabetes.  Breast cancer screening is essential preventative care for women. You should practice "breast self-awareness." This means understanding the normal appearance and feel of your breasts and may include breast self-examination. Any changes detected, no matter how small, should be reported to a caregiver. Women in their 51s and 30s should  have a clinical breast exam (CBE) by a caregiver as part of a regular health exam every 1 to 3 years. After age 49, women should have a CBE every year. Starting at age 28, women should consider having a mammogram (breast X-ray) every year. Women who have a family history of breast cancer should talk to their caregiver about genetic screening. Women at a high risk of breast cancer should talk to their caregiver about having an MRI and a mammogram every year.  The Pap test is a screening test for cervical cancer. Women should have a Pap test starting at age 26. Between ages 57 and 58, Pap tests should be repeated every 2 years. Beginning at age 32, you should have a Pap test every 3 years as long as the past 3 Pap tests have been normal. If you had a hysterectomy for a problem that was not cancer or a condition that could lead to cancer, then you no longer need Pap tests. If you are between ages 38 and 17, and you have had normal Pap tests going back 10 years, you no longer need Pap tests. If you have had past treatment for  cervical cancer or a condition that could lead to cancer, you need Pap tests and screening for cancer for at least 20 years after your treatment. If Pap tests have been discontinued, risk factors (such as a new sexual partner) need to be reassessed to determine if screening should be resumed. Some women have medical problems that increase the chance of getting cervical cancer. In these cases, your caregiver may recommend more frequent screening and Pap tests.  The human papillomavirus (HPV) test is an additional test that may be used for cervical cancer screening. The HPV test looks for the virus that can cause the cell changes on the cervix. The cells collected during the Pap test can be tested for HPV. The HPV test could be used to screen women aged 30 years and older, and should be used in women of any age who have unclear Pap test results. After the age of 55, women should have HPV testing at the same frequency as a Pap test.  Colorectal cancer can be detected and often prevented. Most routine colorectal cancer screening begins at the age of 40 and continues through age 74. However, your caregiver may recommend screening at an earlier age if you have risk factors for colon cancer. On a yearly basis, your caregiver may provide home test kits to check for hidden blood in the stool. Use of a small camera at the end of a tube, to directly examine the colon (sigmoidoscopy or colonoscopy), can detect the earliest forms of colorectal cancer. Talk to your caregiver about this at age 13, when routine screening begins. Direct examination of the colon should be repeated every 5 to 10 years through age 74, unless early forms of pre-cancerous polyps or small growths are found.  Hepatitis C blood testing is recommended for all people born from [birth year] through 1966 and any individual with known risks for hepatitis C.  Practice safe sex. Use condoms and avoid high-risk sexual practices to reduce the spread of sexually  transmitted infections (STIs). Sexually active women aged 51 and younger should be checked for Chlamydia, which is a common sexually transmitted infection. Older women with new or multiple partners should also be tested for Chlamydia. Testing for other STIs is recommended if you are sexually active and at increased risk.  Osteoporosis is a disease in which  the bones lose minerals and strength with aging. This can result in serious bone fractures. The risk of osteoporosis can be identified using a bone density scan. Women ages 47 and over and women at risk for fractures or osteoporosis should discuss screening with their caregivers. Ask your caregiver whether you should be taking a calcium supplement or vitamin D to reduce the rate of osteoporosis.  Menopause can be associated with physical symptoms and risks. Hormone replacement therapy is available to decrease symptoms and risks. You should talk to your caregiver about whether hormone replacement therapy is right for you.  Use sunscreen with a sun protection factor (SPF) of 30 or greater. Apply sunscreen liberally and repeatedly throughout the day. You should seek shade when your shadow is shorter than you. Protect yourself by wearing long sleeves, pants, a wide-brimmed hat, and sunglasses year round, whenever you are outdoors.  Notify your caregiver of new moles or changes in moles, especially if there is a change in shape or color. Also notify your caregiver if a mole is larger than the size of a pencil eraser.  Stay current with your immunizations. Document Released: 03/28/2011 Document Revised: 12/05/2011 Document Reviewed: 03/28/2011 [facility] Patient Information 2014 [place], [place]. Place depot medroxyprogesterone acetate injection patient instructions here. Depo shot every 3 months. Smoking Cessation Quitting smoking is important to your health and has many advantages. However, it is not always easy to quit since nicotine is a very addictive  drug. Often times, people try 3 times or more before being able to quit. This document explains the best ways for you to prepare to quit smoking. Quitting takes hard work and a lot of effort, but you can do it. ADVANTAGES OF QUITTING SMOKING  You will live longer, feel better, and live better.  Your body will feel the impact of quitting smoking almost immediately.  Within 20 minutes, blood pressure decreases. Your pulse returns to its normal level.  After 8 hours, carbon monoxide levels in the blood return to normal. Your oxygen level increases.  After 24 hours, the chance of having a heart attack starts to decrease. Your breath, hair, and body stop smelling like smoke.  After 48 hours, damaged nerve endings begin to recover. Your sense of taste and smell improve.  After 72 hours, the body is virtually free of nicotine. Your bronchial tubes relax and breathing becomes easier.  After 2 to 12 weeks, lungs can hold more air. Exercise becomes easier and circulation improves.  The risk of having a heart attack, stroke, cancer, or lung disease is greatly reduced.  After 1 year, the risk of coronary heart disease is cut in half.  After 5 years, the risk of stroke falls to the same as a nonsmoker.  After 10 years, the risk of lung cancer is cut in half and the risk of other cancers decreases significantly.  After 15 years, the risk of coronary heart disease drops, usually to the level of a nonsmoker.  If you are pregnant, quitting smoking will improve your chances of having a healthy baby.  The people you live with, especially any children, will be healthier.  You will have extra money to spend on things other than cigarettes. QUESTIONS TO THINK ABOUT BEFORE ATTEMPTING TO QUIT You may want to talk about your answers with your caregiver.  Why do you want to quit?  If you tried to quit in the past, what helped and what did not?  What will be the most difficult situations for you  after  you quit? How will you plan to handle them?  Who can help you through the tough times? Your family? Friends? A caregiver?  What pleasures do you get from smoking? What ways can you still get pleasure if you quit? Here are some questions to ask your caregiver:  How can you help me to be successful at quitting?  What medicine do you think would be best for me and how should I take it?  What should I do if I need more help?  What is smoking withdrawal like? How can I get information on withdrawal? GET READY  Set a quit date.  Change your environment by getting rid of all cigarettes, ashtrays, matches, and lighters in your home, car, or work. Do not let people smoke in your home.  Review your past attempts to quit. Think about what worked and what did not. GET SUPPORT AND ENCOURAGEMENT You have a better chance of being successful if you have help. You can get support in many ways.  Tell your family, friends, and co-workers that you are going to quit and need their support. Ask them not to smoke around you.  Get individual, group, or telephone counseling and support. Programs are available at [employer] and health centers. Call your local health department for information about programs in your area.  Spiritual beliefs and practices may help some smokers quit.  Download a "quit meter" on your computer to keep track of quit statistics, such as how long you have gone without smoking, cigarettes not smoked, and money saved.  Get a self-help book about quitting smoking and staying off of tobacco. LEARN NEW SKILLS AND BEHAVIORS  Distract yourself from urges to smoke. Talk to someone, go for a walk, or occupy your time with a task.  Change your normal routine. Take a different route to work. Drink tea instead of coffee. Eat breakfast in a different place.  Reduce your stress. Take a hot bath, exercise, or read a book.  Plan something enjoyable to do every day. Reward yourself for  not smoking.  Explore interactive web-based programs that specialize in helping you quit. GET MEDICINE AND USE IT CORRECTLY Medicines can help you stop smoking and decrease the urge to smoke. Combining medicine with the above behavioral methods and support can greatly increase your chances of successfully quitting smoking.  Nicotine replacement therapy helps deliver nicotine to your body without the negative effects and risks of smoking. Nicotine replacement therapy includes nicotine gum, lozenges, inhalers, nasal sprays, and skin patches. Some may be available over-the-counter and others require a prescription.  Antidepressant medicine helps people abstain from smoking, but how this works is unknown. This medicine is available by prescription.  Nicotinic receptor partial agonist medicine simulates the effect of nicotine in your brain. This medicine is available by prescription. Ask your caregiver for advice about which medicines to use and how to use them based on your health history. Your caregiver will tell you what side effects to look out for if you choose to be on a medicine or therapy. Carefully read the information on the package. Do not use any other product containing nicotine while using a nicotine replacement product.  RELAPSE OR DIFFICULT SITUATIONS Most relapses occur within the first 3 months after quitting. Do not be discouraged if you start smoking again. Remember, most people try several times before finally quitting. You may have symptoms of withdrawal because your body is used to nicotine. You may crave cigarettes, be irritable, feel  very hungry, cough often, get headaches, or have difficulty concentrating. The withdrawal symptoms are only temporary. They are strongest when you first quit, but they will go away within 10 14 days. To reduce the chances of relapse, try to:  Avoid drinking alcohol. Drinking lowers your chances of successfully quitting.  Reduce the amount of  caffeine you consume. Once you quit smoking, the amount of caffeine in your body increases and can give you symptoms, such as a rapid heartbeat, sweating, and anxiety.  Avoid smokers because they can make you want to smoke.  Do not let weight gain distract you. Many smokers will gain weight when they quit, usually less than 10 pounds. Eat a healthy diet and stay active. You can always lose the weight gained after you quit.  Find ways to improve your mood other than smoking. FOR MORE INFORMATION  www.smokefree.gov  Document Released: 09/06/2001 Document Revised: 03/13/2012 Document Reviewed: 12/22/2011 [facility] Patient Information 2014 [place], [place].

## 2013-05-20 NOTE — Progress Notes (Signed)
CC: Postpartum Care, Contraception and Vaginal Discharge    HPI Leah Rojas is a 22 y.o. G2P1011 who presents for a 5 months postpartum visit and for evaluation of a vaginal discharge which has malodor and is profuse. She had an NSVD  At 40+ wks after IOL for FGR. She  has not used contraception since. She is stopped breast-feeding for several weeks. She States some of the baby. Last and unprotected intercourse was over 2 weeks ago.LMP 05/10/2013. Denies abnormal bleeding. She denies depression. Stays home when the baby. There she the baby had had a postdelivery complications. Wants to start on Depo-Provera. Smokes about 4 cigarettes a day   Past Medical History  Diagnosis Date  . Bronchitis     rescue inhaler prn    OB History  Gravida Para Term Preterm AB SAB TAB Ectopic Multiple Living  2 1 1  0 1 1 0 0 0 1    # Outcome Date GA Lbr Len/2nd Weight Sex Delivery Anes PTL Lv  2 TRM 12/05/12 [redacted]w[redacted]d 12:32 / 01:05 6 lb 7 oz (2.92 kg) F SVD EPI  Y  1 SAB 2009 [redacted]w[redacted]d             Past Surgical History  Procedure Laterality Date  . No past surgeries      History   Social History  . Marital Status: Single    Spouse Name: N/A    Number of Children: N/A  . Years of Education: N/A   Occupational History  . Not on file.   Social History Main Topics  . Smoking status: Former Smoker    Quit date: 03/31/2012  . Smokeless tobacco: Never Used  . Alcohol Use: No  . Drug Use: No  . Sexual Activity: Yes   Other Topics Concern  . Not on file   Social History Narrative  . No narrative on file    Current Outpatient Prescriptions on File Prior to Visit  Medication Sig Dispense Refill  . albuterol (PROVENTIL HFA;VENTOLIN HFA) 108 (90 BASE) MCG/ACT inhaler Inhale 2 puffs into the lungs every 6 (six) hours as needed for shortness of breath (Rescue).      Marland Kitchen albuterol (PROVENTIL HFA;VENTOLIN HFA) 108 (90 BASE) MCG/ACT inhaler Inhale 2 puffs into the lungs every 6 (six) hours as needed for  wheezing or shortness of breath.  1 Inhaler  0  . benzocaine-resorcinol (VAGISIL) 5-2 % vaginal cream Place 1 application vaginally daily as needed for itching.       Marland Kitchen ibuprofen (ADVIL) 200 MG tablet Take 3 tablets (600 mg total) by mouth every 6 (six) hours as needed for pain.  30 tablet  0  . loratadine (CLARITIN) 10 MG tablet Take 10 mg by mouth daily as needed for allergies.      . Prenatal Vit-Fe Fumarate-FA (PRENATAL MULTIVITAMIN) TABS Take 1 tablet by mouth at bedtime.  30 tablet  0   No current facility-administered medications on file prior to visit.    No Known Allergies  ROS Pertinent items in HPI. She denies respiratory symptoms and is not using the inhaler  PHYSICAL EXAM Filed Vitals:   05/20/13 1340  BP: 111/72  Pulse: 71  Temp: 97.6 F (36.4 C)   General: Well nourished, well developed female in no acute distress Cardiovascular: Normal rate Respiratory: Normal effort Abdomen: Soft, nontender Back: No CVAT Extremities: No edema Neurologic: Alert and oriented Speculum exam: NEFG; vagina with moderate white-gray  discharge, no blood; cervix friable and reddened. Y-shaped os. Pap,  GC. CT. WP sent. Bimanual exam: cervix closed, no CMT; uterus NSSP; no adnexal tenderness or masses   LAB RESULTS Results for orders placed in visit on 05/20/13 (from the past 24 hour(s))  POCT PREGNANCY, URINE     Status: None   Collection Time    05/20/13  1:53 PM      Result Value Range   Preg Test, Ur NEGATIVE  NEGATIVE     ASSESSMENT  1. Pre-procedure lab exam   2. Vaginal discharge   3. Depo contraception   4. Smoker   5. Cervicitis    Postpartum exam  PLAN Labs sent Depoprovera 150 mg IM today.  Preventive health and smoking cessation info given Return as needed or 1 year.        Danae Orleans, CNM 05/20/2013 2:04 PM

## 2013-05-21 LAB — WET PREP, GENITAL
Trich, Wet Prep: NONE SEEN
Yeast Wet Prep HPF POC: NONE SEEN

## 2013-08-05 ENCOUNTER — Ambulatory Visit (INDEPENDENT_AMBULATORY_CARE_PROVIDER_SITE_OTHER): Payer: Medicaid Other

## 2013-08-05 DIAGNOSIS — Z3049 Encounter for surveillance of other contraceptives: Secondary | ICD-10-CM

## 2013-08-05 MED ORDER — MEDROXYPROGESTERONE ACETATE 104 MG/0.65ML ~~LOC~~ SUSP
104.0000 mg | Freq: Once | SUBCUTANEOUS | Status: AC
  Administered 2013-08-05: 104 mg via SUBCUTANEOUS

## 2013-08-15 ENCOUNTER — Encounter (HOSPITAL_COMMUNITY): Payer: Self-pay | Admitting: Emergency Medicine

## 2013-08-15 ENCOUNTER — Emergency Department (HOSPITAL_COMMUNITY)
Admission: EM | Admit: 2013-08-15 | Discharge: 2013-08-15 | Disposition: A | Discharge status: Home / Self Care | Payer: Medicaid Other | Attending: Emergency Medicine | Admitting: Emergency Medicine

## 2013-08-15 DIAGNOSIS — R197 Diarrhea, unspecified: Secondary | ICD-10-CM | POA: Insufficient documentation

## 2013-08-15 DIAGNOSIS — N898 Other specified noninflammatory disorders of vagina: Secondary | ICD-10-CM | POA: Insufficient documentation

## 2013-08-15 DIAGNOSIS — Z8709 Personal history of other diseases of the respiratory system: Secondary | ICD-10-CM | POA: Insufficient documentation

## 2013-08-15 DIAGNOSIS — N390 Urinary tract infection, site not specified: Secondary | ICD-10-CM | POA: Insufficient documentation

## 2013-08-15 DIAGNOSIS — F17200 Nicotine dependence, unspecified, uncomplicated: Secondary | ICD-10-CM | POA: Insufficient documentation

## 2013-08-15 DIAGNOSIS — R112 Nausea with vomiting, unspecified: Secondary | ICD-10-CM | POA: Insufficient documentation

## 2013-08-15 DIAGNOSIS — A599 Trichomoniasis, unspecified: Secondary | ICD-10-CM

## 2013-08-15 DIAGNOSIS — A5900 Urogenital trichomoniasis, unspecified: Secondary | ICD-10-CM | POA: Insufficient documentation

## 2013-08-15 DIAGNOSIS — Z3202 Encounter for pregnancy test, result negative: Secondary | ICD-10-CM | POA: Insufficient documentation

## 2013-08-15 LAB — COMPREHENSIVE METABOLIC PANEL
AST: 12 U/L (ref 0–37)
Albumin: 3.8 g/dL (ref 3.5–5.2)
Alkaline Phosphatase: 61 U/L (ref 39–117)
BUN: 17 mg/dL (ref 6–23)
Creatinine, Ser: 0.54 mg/dL (ref 0.50–1.10)
Potassium: 3.7 mEq/L (ref 3.5–5.1)
Total Protein: 7.2 g/dL (ref 6.0–8.3)

## 2013-08-15 LAB — WET PREP, GENITAL: Yeast Wet Prep HPF POC: NONE SEEN

## 2013-08-15 LAB — CBC WITH DIFFERENTIAL/PLATELET
Basophils Absolute: 0 10*3/uL (ref 0.0–0.1)
Basophils Relative: 0 % (ref 0–1)
Eosinophils Absolute: 0.1 10*3/uL (ref 0.0–0.7)
Eosinophils Relative: 1 % (ref 0–5)
MCH: 31.6 pg (ref 26.0–34.0)
MCHC: 34.5 g/dL (ref 30.0–36.0)
MCV: 91.3 fL (ref 78.0–100.0)
Monocytes Relative: 6 % (ref 3–12)
Neutrophils Relative %: 86 % — ABNORMAL HIGH (ref 43–77)
Platelets: 277 10*3/uL (ref 150–400)
RDW: 13.7 % (ref 11.5–15.5)

## 2013-08-15 LAB — URINALYSIS, ROUTINE W REFLEX MICROSCOPIC
Glucose, UA: NEGATIVE mg/dL
Ketones, ur: NEGATIVE mg/dL
Protein, ur: NEGATIVE mg/dL
pH: 5.5 (ref 5.0–8.0)

## 2013-08-15 LAB — URINE MICROSCOPIC-ADD ON

## 2013-08-15 MED ORDER — CIPROFLOXACIN HCL 500 MG PO TABS: 500.0000 mg | ORAL_TABLET | Freq: Two times a day (BID) | ORAL | Status: DC

## 2013-08-15 MED ORDER — SODIUM CHLORIDE 0.9 % IV BOLUS (SEPSIS)
1000.0000 mL | Freq: Once | INTRAVENOUS | Status: AC
  Administered 2013-08-15: 1000 mL via INTRAVENOUS

## 2013-08-15 MED ORDER — METRONIDAZOLE 500 MG PO TABS: 500.0000 mg | ORAL_TABLET | Freq: Two times a day (BID) | ORAL | Status: DC

## 2013-08-15 MED ORDER — ONDANSETRON 4 MG PO TBDP: 4.0000 mg | ORAL_TABLET | Freq: Three times a day (TID) | ORAL | Status: DC | PRN

## 2013-08-15 MED ORDER — AZITHROMYCIN 250 MG PO TABS
1000.0000 mg | ORAL_TABLET | Freq: Once | ORAL | Status: AC
  Administered 2013-08-15: 1000 mg via ORAL
  Filled 2013-08-15: qty 4

## 2013-08-15 MED ORDER — DEXTROSE 5 % IV SOLN
1.0000 g | Freq: Once | INTRAVENOUS | Status: AC
  Administered 2013-08-15: 1 g via INTRAVENOUS
  Filled 2013-08-15: qty 10

## 2013-08-15 MED ORDER — ONDANSETRON HCL 4 MG/2ML IJ SOLN
4.0000 mg | Freq: Once | INTRAMUSCULAR | Status: AC
  Administered 2013-08-15: 4 mg via INTRAVENOUS
  Filled 2013-08-15: qty 2

## 2013-08-15 NOTE — ED Provider Notes (Signed)
CSN: [id number]     Arrival date & time 08/15/13  1432 History   First MD Initiated Contact with Patient 08/15/13 1532     Chief Complaint  Patient presents with  . Abdominal Pain  . Emesis  . Diarrhea   (Consider location/radiation/quality/duration/timing/severity/associated sxs/prior Treatment) HPI Comments: Patient presents today with a chief complaint of nausea, vomiting, diarrhea, and abdominal pain.  She reports that her symptoms began this morning.  She states that she has had two episodes of vomiting and two episodes of diarrhea.  No blood in her emesis or blood in her stool.  She is unsure if she has had a fever.  Temp upon arrival in the ED is 100.1 F orally.  She describes the abdominal pain as "soreness" and states that it is primarily in the LLQ.  Pain does not radiate.  She has not taken anything for symptoms prior to arrival.  She also reports that she has had greenish colored vaginal discharge for the past 3 days.  She is unsure of the date of her last menstrual period.  She is currently on Depo and states that her periods are irregular.  She is sexually active.  She also reports some dysuria and increased urinary frequency over the past couple of days.  Denies flank pain.    Patient is a 22 y.o. female presenting with abdominal pain, vomiting, and diarrhea.  Abdominal Pain Associated symptoms: chills, diarrhea, nausea, vaginal discharge and vomiting   Emesis Associated symptoms: abdominal pain, chills and diarrhea   Diarrhea Associated symptoms: abdominal pain, chills and vomiting     Past Medical History  Diagnosis Date  . Bronchitis     rescue inhaler prn   Past Surgical History  Procedure Laterality Date  . No past surgeries     Family History  Problem Relation Age of Onset  . Other Neg Hx   . Mental retardation Cousin    History  Substance Use Topics  . Smoking status: Current Every Day Smoker -- 0.25 packs/day    Types: Cigarettes    Last Attempt to Quit:  03/31/2012  . Smokeless tobacco: Never Used  . Alcohol Use: No   OB History   Grav Para Term Preterm Abortions TAB SAB Ect Mult Living   2 1 1  0 1 0 1 0 0 1     Review of Systems  Constitutional: Positive for chills.  Gastrointestinal: Positive for nausea, vomiting, abdominal pain and diarrhea.  Genitourinary: Positive for vaginal discharge.  All other systems reviewed and are negative.    Allergies  Review of patient's allergies indicates no known allergies.  Home Medications   Current Outpatient Rx  Name  Route  Sig  Dispense  Refill  . albuterol (PROVENTIL HFA;VENTOLIN HFA) 108 (90 BASE) MCG/ACT inhaler   Inhalation   Inhale 2 puffs into the lungs every 6 (six) hours as needed for shortness of breath (Rescue).         . medroxyPROGESTERone (DEPO-PROVERA) 150 MG/ML injection   Intramuscular   Inject 150 mg into the muscle every 3 (three) months.          BP 116/88  Pulse 95  Temp(Src) 100.1 F (37.8 C) (Oral)  Resp 20  SpO2 100% Physical Exam  Nursing note and vitals reviewed. Constitutional: She appears well-developed and well-nourished.  HENT:  Head: Normocephalic and atraumatic.  Mouth/Throat: Oropharynx is clear and moist.  Neck: Normal range of motion. Neck supple.  Cardiovascular: Normal rate, regular rhythm and normal  heart sounds.   Pulmonary/Chest: Effort normal and breath sounds normal.  Abdominal: Soft. Bowel sounds are normal. She exhibits no distension and no mass. There is no rebound, no guarding and no CVA tenderness.  Mild tenderness to palpation of the LLQ  Genitourinary: Cervix exhibits no motion tenderness. Right adnexum displays no mass, no tenderness and no fullness. Left adnexum displays no mass, no tenderness and no fullness.  Musculoskeletal: Normal range of motion.  Neurological: She is alert.  Skin: Skin is warm and dry.  Psychiatric: She has a normal mood and affect.    ED Course  Procedures (including critical care  time) Labs Review Labs Reviewed  GC/CHLAMYDIA PROBE AMP  WET PREP, GENITAL  CBC WITH DIFFERENTIAL  COMPREHENSIVE METABOLIC PANEL  URINALYSIS, ROUTINE W REFLEX MICROSCOPIC   Imaging Review No results found.  EKG Interpretation   None      Patient tolerating PO liquids.  MDM  No diagnosis found. Patient presenting with a chief complaint of nausea, vomiting, diarrhea.  Patient with mild LLQ on exam, no rebound or guarding.  On pelvic exam she does not have any adnexal tenderness or CMT.  Urine pregnancy negative.  Labs unremarkable.  UA showing possible UTI.  Patient started on antibiotics.  Urine cultured.  Wet prep showing Trich.  Patient started on Flagyl.  Patient with no episodes of vomiting or diarrhea during ED course.  Patient tolerating PO liquids.  Feel that the patient is stable for discharge.      Santiago Glad, PA-C 08/15/13 [phone]

## 2013-08-15 NOTE — ED Notes (Signed)
Patient reports that she began having soreness of the LLQ, dysuria, green vaginal discharge, and low back pain. Patient states she has been having vomiting and diarrhea this afternoon. Patient also reports chills.

## 2013-08-15 NOTE — ED Provider Notes (Signed)
Medical screening examination/treatment/procedure(s) were performed by non-physician practitioner and as supervising physician I was immediately available for consultation/collaboration.  Von Inscoe L Arish Redner, MD 08/15/13 2337 

## 2013-08-15 NOTE — Progress Notes (Signed)
Patient confirms the physician listed on her Medicaid card is [facility].  [facility] provided patient with a list of pcps who accept Medicaid insurance in [place].  Instructed patient if she changes her pcp, she will need to call the departmanet of social services to give them  an update.  Patient verbalized understanding.

## 2013-08-17 LAB — GC/CHLAMYDIA PROBE AMP
CT Probe RNA: POSITIVE — AB
GC Probe RNA: NEGATIVE

## 2013-08-18 ENCOUNTER — Telehealth (HOSPITAL_COMMUNITY): Payer: Self-pay | Admitting: Emergency Medicine

## 2013-08-18 LAB — URINE CULTURE: Colony Count: >=100000

## 2013-08-18 NOTE — ED Notes (Signed)
+  Chlamydia. Patient treated with Rocephin and Zithromax. [employer] faxed. 

## 2013-08-18 NOTE — ED Notes (Signed)
Patient has +Chlamydia. 

## 2013-09-26 NOTE — ED Notes (Signed)
No response after 30 days  chart appended and sent to Medical Records 

## 2013-11-06 ENCOUNTER — Ambulatory Visit: Payer: Medicaid Other

## 2014-04-30 ENCOUNTER — Emergency Department (HOSPITAL_COMMUNITY)
Admission: EM | Admit: 2014-04-30 | Discharge: 2014-04-30 | Disposition: A | Discharge status: Home / Self Care | Payer: Medicaid Other | Attending: Emergency Medicine | Admitting: Emergency Medicine

## 2014-04-30 ENCOUNTER — Encounter (HOSPITAL_COMMUNITY): Payer: Self-pay | Admitting: Emergency Medicine

## 2014-04-30 DIAGNOSIS — J020 Streptococcal pharyngitis: Secondary | ICD-10-CM

## 2014-04-30 DIAGNOSIS — F17200 Nicotine dependence, unspecified, uncomplicated: Secondary | ICD-10-CM | POA: Insufficient documentation

## 2014-04-30 DIAGNOSIS — J029 Acute pharyngitis, unspecified: Secondary | ICD-10-CM | POA: Insufficient documentation

## 2014-04-30 DIAGNOSIS — Z8709 Personal history of other diseases of the respiratory system: Secondary | ICD-10-CM | POA: Insufficient documentation

## 2014-04-30 DIAGNOSIS — K089 Disorder of teeth and supporting structures, unspecified: Secondary | ICD-10-CM | POA: Insufficient documentation

## 2014-04-30 DIAGNOSIS — Z79899 Other long term (current) drug therapy: Secondary | ICD-10-CM | POA: Insufficient documentation

## 2014-04-30 DIAGNOSIS — Z792 Long term (current) use of antibiotics: Secondary | ICD-10-CM | POA: Insufficient documentation

## 2014-04-30 LAB — RAPID STREP SCREEN (MED CTR MEBANE ONLY): STREPTOCOCCUS, GROUP A SCREEN (DIRECT): POSITIVE — AB

## 2014-04-30 MED ORDER — HYDROCODONE-ACETAMINOPHEN 5-325 MG PO TABS: 1.0000 | ORAL_TABLET | Freq: Four times a day (QID) | ORAL | Status: DC | PRN

## 2014-04-30 MED ORDER — PENICILLIN G BENZATHINE 1200000 UNIT/2ML IM SUSP
1.2000 10*6.[IU] | Freq: Once | INTRAMUSCULAR | Status: AC
  Administered 2014-04-30: 1.2 10*6.[IU] via INTRAMUSCULAR
  Filled 2014-04-30: qty 2

## 2014-04-30 NOTE — ED Provider Notes (Signed)
CSN: [id number]     Arrival date & time 04/30/14  0932 History   First MD Initiated Contact with Patient 04/30/14 1005     Chief Complaint  Patient presents with  . Sore Throat     (Consider location/radiation/quality/duration/timing/severity/associated sxs/prior Treatment) HPI  23 year old female presents for evaluation of sore throat. Patient states for the past 3 days she has had gradual onset of throat soreness, worsening with swallowing, persistent, minimally improved with taking ibuprofen and hot tea. She also has had left lower wisdom teeth pain for many months. She noticed that the tooth is growing crooked and would like to have it removed however she does not have insurance to afford dental care. She denies fever, chills, running nose, sneezing, cough, chest pain, shortness of breath, or rash. She denies any recent trauma. She is a smoker. She denies any abdominal pain.  Past Medical History  Diagnosis Date  . Bronchitis     rescue inhaler prn   Past Surgical History  Procedure Laterality Date  . No past surgeries     Family History  Problem Relation Age of Onset  . Other Neg Hx   . Mental retardation Cousin    History  Substance Use Topics  . Smoking status: Current Every Day Smoker -- 0.25 packs/day    Types: Cigarettes    Last Attempt to Quit: 03/31/2012  . Smokeless tobacco: Never Used  . Alcohol Use: No   OB History   Grav Para Term Preterm Abortions TAB SAB Ect Mult Living   2 1 1  0 1 0 1 0 0 1     Review of Systems  Constitutional: Negative for fever.  HENT: Positive for dental problem, sore throat and trouble swallowing. Negative for voice change.   Skin: Negative for rash and wound.      Allergies  Review of patient's allergies indicates no known allergies.  Home Medications   Prior to Admission medications   Medication Sig Start Date End Date Taking? Authorizing Provider  albuterol (PROVENTIL HFA;VENTOLIN HFA) 108 (90 BASE) MCG/ACT inhaler  Inhale 2 puffs into the lungs every 6 (six) hours as needed for shortness of breath (Rescue).    Historical Provider, MD  ciprofloxacin (CIPRO) 500 MG tablet Take 1 tablet (500 mg total) by mouth 2 (two) times daily. 08/15/13   Heather Laisure, PA-C  medroxyPROGESTERone (DEPO-PROVERA) 150 MG/ML injection Inject 150 mg into the muscle every 3 (three) months.    Historical Provider, MD  metroNIDAZOLE (FLAGYL) 500 MG tablet Take 1 tablet (500 mg total) by mouth 2 (two) times daily. 08/15/13   Heather Laisure, PA-C  ondansetron (ZOFRAN ODT) 4 MG disintegrating tablet Take 1 tablet (4 mg total) by mouth every 8 (eight) hours as needed for nausea or vomiting. 08/15/13   Heather Laisure, PA-C   BP 120/57  Pulse 93  Temp(Src) 98.7 F (37.1 C) (Oral)  Resp 16  SpO2 100%  LMP 04/09/2014 Physical Exam  Nursing note and vitals reviewed. Constitutional: She appears well-developed and well-nourished. No distress.  HENT:  Head: Atraumatic.  Right Ear: External ear normal.  Left Ear: External ear normal.  Nose: Nose normal.  Mouth/Throat: Oropharynx is clear and moist. No oropharyngeal exudate.  Throat: Uvula is midline, bilateral tonsillar exudates without tonsillar enlargement. No evidence of peritonsillar abscess or retropharyngeal abscess. No trismus.  Left lower wisdom tooth appears crowded, and growing crooked but no obvious signs of infection.  Eyes: Conjunctivae are normal.  Neck: Normal range of motion. Neck supple.  Lymphadenopathy:    She has cervical adenopathy.  Neurological: She is alert.  Skin: No rash noted.  Psychiatric: She has a normal mood and affect.    ED Course  Procedures (including critical care time)   Sore throat for 3 days. She does have tonsillar exudates. No signs of deep tissue infection. Rapid strep test ordered.  10:55 AM Strep positive.  Bicillin 1.2 mil unit IM shot given.  Pt also request dental referral for her wisdom tooth, will refer pt to oncall  dentist, Dr. Mayford Knifeurner.  Pain medication prescribed.  Recommend gargle with salt water.    Labs Review Labs Reviewed  RAPID STREP SCREEN - Abnormal; Notable for the following:    Streptococcus, Group A Screen (Direct) POSITIVE (*)    All other components within normal limits    Imaging Review No results found.   EKG Interpretation None      MDM   Final diagnoses:  Strep pharyngitis    BP 120/57  Pulse 93  Temp(Src) 98.7 F (37.1 C) (Oral)  Resp 16  SpO2 100%  LMP 04/09/2014     Fayrene HelperBowie Ayumi Wangerin, PA-C 04/30/14 1056

## 2014-04-30 NOTE — ED Provider Notes (Signed)
Medical screening examination/treatment/procedure(s) were performed by non-physician practitioner and as supervising physician I was immediately available for consultation/collaboration.   EKG Interpretation None       Doug SouSam Ezabella Teska, MD 04/30/14 [phone]

## 2014-04-30 NOTE — ED Notes (Signed)
Per pt, states sore throat for 3 days-states infected upper wisdom tooth

## 2014-04-30 NOTE — Discharge Instructions (Signed)

## 2014-07-28 ENCOUNTER — Encounter (HOSPITAL_COMMUNITY): Payer: Self-pay | Admitting: Emergency Medicine

## 2014-09-16 ENCOUNTER — Emergency Department (HOSPITAL_COMMUNITY)
Admission: EM | Admit: 2014-09-16 | Discharge: 2014-09-16 | Disposition: A | Discharge status: Home / Self Care | Payer: Medicaid Other | Attending: Emergency Medicine | Admitting: Emergency Medicine

## 2014-09-16 ENCOUNTER — Encounter (HOSPITAL_COMMUNITY): Payer: Self-pay | Admitting: Emergency Medicine

## 2014-09-16 DIAGNOSIS — R112 Nausea with vomiting, unspecified: Secondary | ICD-10-CM

## 2014-09-16 DIAGNOSIS — R197 Diarrhea, unspecified: Secondary | ICD-10-CM | POA: Insufficient documentation

## 2014-09-16 DIAGNOSIS — Z720 Tobacco use: Secondary | ICD-10-CM | POA: Insufficient documentation

## 2014-09-16 DIAGNOSIS — N390 Urinary tract infection, site not specified: Secondary | ICD-10-CM | POA: Insufficient documentation

## 2014-09-16 DIAGNOSIS — Z8709 Personal history of other diseases of the respiratory system: Secondary | ICD-10-CM | POA: Insufficient documentation

## 2014-09-16 DIAGNOSIS — Z3202 Encounter for pregnancy test, result negative: Secondary | ICD-10-CM | POA: Insufficient documentation

## 2014-09-16 LAB — COMPREHENSIVE METABOLIC PANEL
ALT: 11 U/L (ref 0–35)
ANION GAP: 7 (ref 5–15)
AST: 15 U/L (ref 0–37)
Albumin: 4.2 g/dL (ref 3.5–5.2)
Alkaline Phosphatase: 57 U/L (ref 39–117)
BUN: 18 mg/dL (ref 6–23)
CALCIUM: 8.9 mg/dL (ref 8.4–10.5)
CO2: 26 mmol/L (ref 19–32)
CREATININE: 0.53 mg/dL (ref 0.50–1.10)
Chloride: 106 mEq/L (ref 96–112)
Glucose, Bld: 106 mg/dL — ABNORMAL HIGH (ref 70–99)
Potassium: 3.9 mmol/L (ref 3.5–5.1)
Sodium: 139 mmol/L (ref 135–145)
TOTAL PROTEIN: 7.6 g/dL (ref 6.0–8.3)
Total Bilirubin: 0.5 mg/dL (ref 0.3–1.2)

## 2014-09-16 LAB — CBC WITH DIFFERENTIAL/PLATELET
Basophils Absolute: 0 10*3/uL (ref 0.0–0.1)
Basophils Relative: 0 % (ref 0–1)
EOS PCT: 2 % (ref 0–5)
Eosinophils Absolute: 0.2 10*3/uL (ref 0.0–0.7)
HEMATOCRIT: 36.6 % (ref 36.0–46.0)
Hemoglobin: 12.3 g/dL (ref 12.0–15.0)
LYMPHS ABS: 2.5 10*3/uL (ref 0.7–4.0)
Lymphocytes Relative: 28 % (ref 12–46)
MCH: 31.1 pg (ref 26.0–34.0)
MCHC: 33.6 g/dL (ref 30.0–36.0)
MCV: 92.7 fL (ref 78.0–100.0)
MONO ABS: 0.6 10*3/uL (ref 0.1–1.0)
Monocytes Relative: 7 % (ref 3–12)
Neutro Abs: 5.6 10*3/uL (ref 1.7–7.7)
Neutrophils Relative %: 63 % (ref 43–77)
PLATELETS: 324 10*3/uL (ref 150–400)
RBC: 3.95 MIL/uL (ref 3.87–5.11)
RDW: 14.1 % (ref 11.5–15.5)
WBC: 8.9 10*3/uL (ref 4.0–10.5)

## 2014-09-16 LAB — URINALYSIS, ROUTINE W REFLEX MICROSCOPIC
Bilirubin Urine: NEGATIVE
Glucose, UA: NEGATIVE mg/dL
Hgb urine dipstick: NEGATIVE
KETONES UR: NEGATIVE mg/dL
Nitrite: NEGATIVE
Protein, ur: NEGATIVE mg/dL
Specific Gravity, Urine: 1.027 (ref 1.005–1.030)
UROBILINOGEN UA: 1 mg/dL (ref 0.0–1.0)
pH: 6.5 (ref 5.0–8.0)

## 2014-09-16 LAB — LIPASE, BLOOD: LIPASE: 18 U/L (ref 11–59)

## 2014-09-16 LAB — URINE MICROSCOPIC-ADD ON

## 2014-09-16 LAB — POC URINE PREG, ED: PREG TEST UR: NEGATIVE

## 2014-09-16 MED ORDER — KETOROLAC TROMETHAMINE 30 MG/ML IJ SOLN
30.0000 mg | Freq: Once | INTRAMUSCULAR | Status: AC
  Administered 2014-09-16: 30 mg via INTRAVENOUS
  Filled 2014-09-16: qty 1

## 2014-09-16 MED ORDER — SODIUM CHLORIDE 0.9 % IV BOLUS (SEPSIS)
1000.0000 mL | Freq: Once | INTRAVENOUS | Status: AC
  Administered 2014-09-16: 1000 mL via INTRAVENOUS

## 2014-09-16 MED ORDER — CEPHALEXIN 500 MG PO CAPS: 500.0000 mg | ORAL_CAPSULE | Freq: Two times a day (BID) | ORAL | Status: DC

## 2014-09-16 MED ORDER — ONDANSETRON 4 MG PO TBDP: 4.0000 mg | ORAL_TABLET | Freq: Three times a day (TID) | ORAL | Status: DC | PRN

## 2014-09-16 MED ORDER — LOPERAMIDE HCL 2 MG PO CAPS
2.0000 mg | ORAL_CAPSULE | Freq: Once | ORAL | Status: AC
  Administered 2014-09-16: 2 mg via ORAL
  Filled 2014-09-16: qty 1

## 2014-09-16 MED ORDER — CEPHALEXIN 500 MG PO CAPS
500.0000 mg | ORAL_CAPSULE | Freq: Once | ORAL | Status: AC
  Administered 2014-09-16: 500 mg via ORAL
  Filled 2014-09-16: qty 1

## 2014-09-16 MED ORDER — ONDANSETRON HCL 4 MG/2ML IJ SOLN
4.0000 mg | Freq: Once | INTRAMUSCULAR | Status: AC
  Administered 2014-09-16: 4 mg via INTRAVENOUS
  Filled 2014-09-16: qty 2

## 2014-09-16 NOTE — ED Provider Notes (Signed)
TIME SEEN: 11:30 AM  CHIEF COMPLAINT: Abdominal pain, nausea, vomiting, diarrhea  HPI: Pt is a 23 y.o. F with no significant past medical history who presents to the emergency department with abdominal cramping, and nausea, nonbloody and nonbilious vomiting, nonbloody diarrhea that started last night. She reports she has had 1-2 episodes of emesis and less than 5 episodes of diarrhea. She reports she's having diffuse abdominal cramping. Last menstrual period was December 15. No dysuria, hematuria, vaginal discharge or bleeding. No prior abdominal surgeries. No sick contacts or recent travel.  ROS: See HPI Constitutional: no fever  Eyes: no drainage  ENT: no runny nose   Cardiovascular:  no chest pain  Resp: no SOB  GI: no vomiting GU: no dysuria Integumentary: no rash  Allergy: no hives  Musculoskeletal: no leg swelling  Neurological: no slurred speech ROS otherwise negative  PAST MEDICAL HISTORY/PAST SURGICAL HISTORY:  Past Medical History  Diagnosis Date  . Bronchitis     rescue inhaler prn    MEDICATIONS:  Prior to Admission medications   Medication Sig Start Date End Date Taking? Authorizing Provider  albuterol (PROVENTIL HFA;VENTOLIN HFA) 108 (90 BASE) MCG/ACT inhaler Inhale 2 puffs into the lungs every 6 (six) hours as needed for shortness of breath (Rescue).    Historical Provider, MD  HYDROcodone-acetaminophen (NORCO/VICODIN) 5-325 MG per tablet Take 1 tablet by mouth every 6 (six) hours as needed for moderate pain or severe pain. 04/30/14   Fayrene HelperBowie Tran, PA-C  ibuprofen (ADVIL,MOTRIN) 200 MG tablet Take 400 mg by mouth every 6 (six) hours as needed (For tooth pain.).    Historical Provider, MD    ALLERGIES:  No Known Allergies  SOCIAL HISTORY:  History  Substance Use Topics  . Smoking status: Current Every Day Smoker -- 0.25 packs/day    Types: Cigarettes    Last Attempt to Quit: 03/31/2012  . Smokeless tobacco: Never Used  . Alcohol Use: No    FAMILY  HISTORY: Family History  Problem Relation Age of Onset  . Other Neg Hx   . Mental retardation Cousin     EXAM: BP 133/62 mmHg  Pulse 100  Temp(Src) 98.4 F (36.9 C) (Oral)  Resp 18  SpO2 100%  LMP 09/09/2014 CONSTITUTIONAL: Alert and oriented and responds appropriately to questions. Well-appearing; well-nourished, well-hydrated, in no distress, pleasant, smiling HEAD: Normocephalic EYES: Conjunctivae clear, PERRL ENT: normal nose; no rhinorrhea; moist mucous membranes; pharynx without lesions noted NECK: Supple, no meningismus, no LAD  CARD: RRR; S1 and S2 appreciated; no murmurs, no clicks, no rubs, no gallops RESP: Normal chest excursion without splinting or tachypnea; breath sounds clear and equal bilaterally; no wheezes, no rhonchi, no rales ABD/GI: Normal bowel sounds; non-distended; soft, non-tender, no rebound, no guarding; negative Murphy sign, nontender at McBurney's point, no peritoneal signs BACK:  The back appears normal and is non-tender to palpation, there is no CVA tenderness EXT: Normal ROM in all joints; non-tender to palpation; no edema; normal capillary refill; no cyanosis    SKIN: Normal color for age and race; warm NEURO: Moves all extremities equally PSYCH: The patient's mood and manner are appropriate. Grooming and personal hygiene are appropriate.  MEDICAL DECISION MAKING: Patient here with likely viral gastroenteritis. Labs, urine ordered in triage are pending. We'll give Zofran, Imodium, Toradol, IV fluids and by mouth challenge. Anticipate discharge home. Abdominal exam is benign. I do not feel she needs abdominal imaging at this time.  ED PROGRESS: Patient's labs are unremarkable. Urine shows leukocytes and bacteria.  Culture pending. She does not endorse foul-smelling urine and urinary frequency. Will treat with Keflex twice a day for a week. She has tolerated by mouth without difficulty. She is requesting a work note. We'll discharge home with Keflex, work  note, Phenergan. Discussed return precautions. She verbalized understanding and is comfortable with plan.     Layla MawKristen N Ellawyn Wogan, DO 09/16/14 1257

## 2014-09-16 NOTE — ED Notes (Signed)
Pt drinking water at bedside. Pt also requested graham crackers and saltine crackers.

## 2014-09-16 NOTE — ED Notes (Signed)
Pt c/o gen abd pain, NVD since last night.

## 2014-09-16 NOTE — Discharge Instructions (Signed)
Viral Gastroenteritis Viral gastroenteritis is also known as stomach flu. This condition affects the stomach and intestinal tract. It can cause sudden diarrhea and vomiting. The illness typically lasts 3 to 8 days. Most people develop an immune response that eventually gets rid of the virus. While this natural response develops, the virus can make you quite ill. CAUSES  Many different viruses can cause gastroenteritis, such as rotavirus or noroviruses. You can catch one of these viruses by consuming contaminated food or water. You may also catch a virus by sharing utensils or other personal items with an infected person or by touching a contaminated surface. SYMPTOMS  The most common symptoms are diarrhea and vomiting. These problems can cause a severe loss of body fluids (dehydration) and a body salt (electrolyte) imbalance. Other symptoms may include:  Fever.  Headache.  Fatigue.  Abdominal pain. DIAGNOSIS  Your caregiver can usually diagnose viral gastroenteritis based on your symptoms and a physical exam. A stool sample may also be taken to test for the presence of viruses or other infections. TREATMENT  This illness typically goes away on its own. Treatments are aimed at rehydration. The most serious cases of viral gastroenteritis involve vomiting so severely that you are not able to keep fluids down. In these cases, fluids must be given through an intravenous line (IV). HOME CARE INSTRUCTIONS   Drink enough fluids to keep your urine clear or pale yellow. Drink small amounts of fluids frequently and increase the amounts as tolerated.  Ask your caregiver for specific rehydration instructions.  Avoid:  Foods high in sugar.  Alcohol.  Carbonated drinks.  Tobacco.  Juice.  Caffeine drinks.  Extremely hot or cold fluids.  Fatty, greasy foods.  Too much intake of anything at one time.  Dairy products until 24 to 48 hours after diarrhea stops.  You may consume probiotics.  Probiotics are active cultures of beneficial bacteria. They may lessen the amount and number of diarrheal stools in adults. Probiotics can be found in yogurt with active cultures and in supplements.  Wash your hands well to avoid spreading the virus.  Only take over-the-counter or prescription medicines for pain, discomfort, or fever as directed by your caregiver. Do not give aspirin to children. Antidiarrheal medicines are not recommended.  Ask your caregiver if you should continue to take your regular prescribed and over-the-counter medicines.  Keep all follow-up appointments as directed by your caregiver. SEEK IMMEDIATE MEDICAL CARE IF:   You are unable to keep fluids down.  You do not urinate at least once every 6 to 8 hours.  You develop shortness of breath.  You notice blood in your stool or vomit. This may look like coffee grounds.  You have abdominal pain that increases or is concentrated in one small area (localized).  You have persistent vomiting or diarrhea.  You have a fever.  The patient is a child younger than 3 months, and he or she has a fever.  The patient is a child older than 3 months, and he or she has a fever and persistent symptoms.  The patient is a child older than 3 months, and he or she has a fever and symptoms suddenly get worse.  The patient is a baby, and he or she has no tears when crying. MAKE SURE YOU:   Understand these instructions.  Will watch your condition.  Will get help right away if you are not doing well or get worse. Document Released: 09/12/2005 Document Revised: 12/05/2011 Document Reviewed: 06/29/2011   ExitCare Patient Information 2015 [employer]. This information is not intended to replace advice given to you by your health care provider. Make sure you discuss any questions you have with your health care provider. Urinary Tract Infection Urinary tract infections (UTIs) can develop anywhere along your urinary tract. Your  urinary tract is your body's drainage system for removing wastes and extra water. Your urinary tract includes two kidneys, two ureters, a bladder, and a urethra. Your kidneys are a pair of bean-shaped organs. Each kidney is about the size of your fist. They are located below your ribs, one on each side of your spine. CAUSES Infections are caused by microbes, which are microscopic organisms, including fungi, viruses, and bacteria. These organisms are so small that they can only be seen through a microscope. Bacteria are the microbes that most commonly cause UTIs. SYMPTOMS  Symptoms of UTIs may vary by age and gender of the patient and by the location of the infection. Symptoms in young women typically include a frequent and intense urge to urinate and a painful, burning feeling in the bladder or urethra during urination. Older women and men are more likely to be tired, shaky, and weak and have muscle aches and abdominal pain. A fever may mean the infection is in your kidneys. Other symptoms of a kidney infection include pain in your back or sides below the ribs, nausea, and vomiting. DIAGNOSIS To diagnose a UTI, your caregiver will ask you about your symptoms. Your caregiver also will ask to provide a urine sample. The urine sample will be tested for bacteria and white blood cells. White blood cells are made by your body to help fight infection. TREATMENT  Typically, UTIs can be treated with medication. Because most UTIs are caused by a bacterial infection, they usually can be treated with the use of antibiotics. The choice of antibiotic and length of treatment depend on your symptoms and the type of bacteria causing your infection. HOME CARE INSTRUCTIONS  If you were prescribed antibiotics, take them exactly as your caregiver instructs you. Finish the medication even if you feel better after you have only taken some of the medication.  Drink enough water and fluids to keep your urine clear or pale  yellow.  Avoid caffeine, tea, and carbonated beverages. They tend to irritate your bladder.  Empty your bladder often. Avoid holding urine for long periods of time.  Empty your bladder before and after sexual intercourse.  After a bowel movement, women should cleanse from front to back. Use each tissue only once. SEEK MEDICAL CARE IF:   You have back pain.  You develop a fever.  Your symptoms do not begin to resolve within 3 days. SEEK IMMEDIATE MEDICAL CARE IF:   You have severe back pain or lower abdominal pain.  You develop chills.  You have nausea or vomiting.  You have continued burning or discomfort with urination. MAKE SURE YOU:   Understand these instructions.  Will watch your condition.  Will get help right away if you are not doing well or get worse. Document Released: 06/22/2005 Document Revised: 03/13/2012 Document Reviewed: 10/21/2011 ExitCare Patient Information 2015 [employer]. This information is not intended to replace advice given to you by your health care provider. Make sure you discuss any questions you have with your health care provider.  

## 2014-09-17 LAB — URINE CULTURE: Colony Count: >=100000

## 2014-12-10 ENCOUNTER — Emergency Department (INDEPENDENT_AMBULATORY_CARE_PROVIDER_SITE_OTHER)
Admission: EM | Admit: 2014-12-10 | Discharge: 2014-12-10 | Disposition: A | Discharge status: Home / Self Care | Payer: Self-pay | Source: Home / Self Care | Attending: Family Medicine | Admitting: Family Medicine

## 2014-12-10 ENCOUNTER — Encounter (HOSPITAL_COMMUNITY): Payer: Self-pay | Admitting: Emergency Medicine

## 2014-12-10 DIAGNOSIS — A084 Viral intestinal infection, unspecified: Secondary | ICD-10-CM

## 2014-12-10 MED ORDER — NAPROXEN 375 MG PO TABS: 375.0000 mg | ORAL_TABLET | Freq: Two times a day (BID) | ORAL | Status: DC

## 2014-12-10 MED ORDER — PROMETHAZINE HCL 25 MG PO TABS: 25.0000 mg | ORAL_TABLET | Freq: Four times a day (QID) | ORAL | Status: DC | PRN

## 2014-12-10 NOTE — ED Provider Notes (Signed)
Leah Rojas is a 24 y.o. female who presents to Urgent Care today for body aches vomiting diarrhea headache fatigue. Symptoms present for one day. No chest pains palpitations or significant abdominal pain. Patient has not tried any medications yet. Symptoms present for one day. Multiple people at work are sick with a similar illness.   Past Medical History  Diagnosis Date  . Bronchitis     rescue inhaler prn   Past Surgical History  Procedure Laterality Date  . No past surgeries     History  Substance Use Topics  . Smoking status: Current Every Day Smoker -- 0.25 packs/day    Types: Cigarettes    Last Attempt to Quit: 03/31/2012  . Smokeless tobacco: Never Used  . Alcohol Use: No   ROS as above Medications: Current Facility-Administered Medications  Medication Dose Route Frequency Provider Last Rate Last Dose  . medroxyPROGESTERone (DEPO-PROVERA) injection 150 mg  150 mg Intramuscular Q90 days Danae Orleanseirdre C Poe, CNM   150 mg at 05/20/13 1418   Current Outpatient Prescriptions  Medication Sig Dispense Refill  . albuterol (PROVENTIL HFA;VENTOLIN HFA) 108 (90 BASE) MCG/ACT inhaler Inhale 2 puffs into the lungs every 6 (six) hours as needed for shortness of breath (Rescue).    . naproxen (NAPROSYN) 375 MG tablet Take 1 tablet (375 mg total) by mouth 2 (two) times daily. 20 tablet 0  . promethazine (PHENERGAN) 25 MG tablet Take 1 tablet (25 mg total) by mouth every 6 (six) hours as needed for nausea or vomiting. 20 tablet 0   No Known Allergies   Exam:  BP 113/64 mmHg  Pulse 85  Temp(Src) 97.9 F (36.6 C) (Oral)  Resp 16  SpO2 97%  LMP 11/25/2014 Gen: Well NAD HEENT: EOMI,  MMM Lungs: Normal work of breathing. CTABL Heart: RRR no MRG Abd: NABS, Soft. Nondistended, Nontender Exts: Brisk capillary refill, warm and well perfused.   No results found for this or any previous visit (from the past 24 hour(s)). No results found.  Assessment and Plan: 24 y.o. female with viral  gastroenteritis. Treat with Phenergan and naproxen.  Discussed warning signs or symptoms. Please see discharge instructions. Patient expresses understanding.     Rodolph BongEvan S Corey, MD 12/10/14 [id number]

## 2014-12-10 NOTE — ED Notes (Signed)
C/o diarrhea, vomiting, nauseas, HA, sneezing and cough onset yest Denies fevers Alert, no signs of acute distress.

## 2014-12-10 NOTE — Discharge Instructions (Signed)
Thank you for coming in today. °Call or go to the emergency room if you get worse, have trouble breathing, have chest pains, or palpitations.  ° °Viral Gastroenteritis °Viral gastroenteritis is also known as stomach flu. This condition affects the stomach and intestinal tract. It can cause sudden diarrhea and vomiting. The illness typically lasts 3 to 8 days. Most people develop an immune response that eventually gets rid of the virus. While this natural response develops, the virus can make you quite ill. °CAUSES  °Many different viruses can cause gastroenteritis, such as rotavirus or noroviruses. You can catch one of these viruses by consuming contaminated food or water. You may also catch a virus by sharing utensils or other personal items with an infected person or by touching a contaminated surface. °SYMPTOMS  °The most common symptoms are diarrhea and vomiting. These problems can cause a severe loss of body fluids (dehydration) and a body salt (electrolyte) imbalance. Other symptoms may include: °· Fever. °· Headache. °· Fatigue. °· Abdominal pain. °DIAGNOSIS  °Your caregiver can usually diagnose viral gastroenteritis based on your symptoms and a physical exam. A stool sample may also be taken to test for the presence of viruses or other infections. °TREATMENT  °This illness typically goes away on its own. Treatments are aimed at rehydration. The most serious cases of viral gastroenteritis involve vomiting so severely that you are not able to keep fluids down. In these cases, fluids must be given through an intravenous line (IV). °HOME CARE INSTRUCTIONS  °· Drink enough fluids to keep your urine clear or pale yellow. Drink small amounts of fluids frequently and increase the amounts as tolerated. °· Ask your caregiver for specific rehydration instructions. °· Avoid: °¨ Foods high in sugar. °¨ Alcohol. °¨ Carbonated drinks. °¨ Tobacco. °¨ Juice. °¨ Caffeine drinks. °¨ Extremely hot or cold fluids. °¨ Fatty,  greasy foods. °¨ Too much intake of anything at one time. °¨ Dairy products until 24 to 48 hours after diarrhea stops. °· You may consume probiotics. Probiotics are active cultures of beneficial bacteria. They may lessen the amount and number of diarrheal stools in adults. Probiotics can be found in yogurt with active cultures and in supplements. °· Wash your hands well to avoid spreading the virus. °· Only take over-the-counter or prescription medicines for pain, discomfort, or fever as directed by your caregiver. Do not give aspirin to children. Antidiarrheal medicines are not recommended. °· Ask your caregiver if you should continue to take your regular prescribed and over-the-counter medicines. °· Keep all follow-up appointments as directed by your caregiver. °SEEK IMMEDIATE MEDICAL CARE IF:  °· You are unable to keep fluids down. °· You do not urinate at least once every 6 to 8 hours. °· You develop shortness of breath. °· You notice blood in your stool or vomit. This may look like coffee grounds. °· You have abdominal pain that increases or is concentrated in one small area (localized). °· You have persistent vomiting or diarrhea. °· You have a fever. °· The patient is a child younger than 3 months, and he or she has a fever. °· The patient is a child older than 3 months, and he or she has a fever and persistent symptoms. °· The patient is a child older than 3 months, and he or she has a fever and symptoms suddenly get worse. °· The patient is a baby, and he or she has no tears when crying. °MAKE SURE YOU:  °· Understand these instructions. °· Will   watch your condition. °· Will get help right away if you are not doing well or get worse. °Document Released: 09/12/2005 Document Revised: 12/05/2011 Document Reviewed: 06/29/2011 °ExitCare® Patient Information ©2015 [employer]. This information is not intended to replace advice given to you by your health care provider. Make sure you discuss any questions you  have with your health care provider. ° °

## 2015-01-04 ENCOUNTER — Emergency Department (HOSPITAL_COMMUNITY): Payer: Medicaid Other

## 2015-01-04 ENCOUNTER — Encounter (HOSPITAL_COMMUNITY): Payer: Self-pay | Admitting: Emergency Medicine

## 2015-01-04 ENCOUNTER — Inpatient Hospital Stay (HOSPITAL_COMMUNITY): Payer: Medicaid Other

## 2015-01-04 ENCOUNTER — Inpatient Hospital Stay (HOSPITAL_COMMUNITY)
Admission: EM | Admit: 2015-01-04 | Discharge: 2015-01-14 | DRG: 083 | Disposition: A | Discharge status: Rehabilitation Facility | Payer: Medicaid Other | Attending: General Surgery | Admitting: General Surgery

## 2015-01-04 DIAGNOSIS — R402312 Coma scale, best motor response, none, at arrival to emergency department: Secondary | ICD-10-CM | POA: Diagnosis present

## 2015-01-04 DIAGNOSIS — S0990XA Unspecified injury of head, initial encounter: Secondary | ICD-10-CM | POA: Diagnosis present

## 2015-01-04 DIAGNOSIS — D62 Acute posthemorrhagic anemia: Secondary | ICD-10-CM | POA: Diagnosis not present

## 2015-01-04 DIAGNOSIS — R1310 Dysphagia, unspecified: Secondary | ICD-10-CM | POA: Diagnosis present

## 2015-01-04 DIAGNOSIS — S0193XA Puncture wound without foreign body of unspecified part of head, initial encounter: Secondary | ICD-10-CM

## 2015-01-04 DIAGNOSIS — F17210 Nicotine dependence, cigarettes, uncomplicated: Secondary | ICD-10-CM

## 2015-01-04 DIAGNOSIS — Z79899 Other long term (current) drug therapy: Secondary | ICD-10-CM | POA: Diagnosis not present

## 2015-01-04 DIAGNOSIS — R402212 Coma scale, best verbal response, none, at arrival to emergency department: Secondary | ICD-10-CM | POA: Diagnosis present

## 2015-01-04 DIAGNOSIS — N390 Urinary tract infection, site not specified: Secondary | ICD-10-CM | POA: Diagnosis not present

## 2015-01-04 DIAGNOSIS — R402112 Coma scale, eyes open, never, at arrival to emergency department: Secondary | ICD-10-CM | POA: Diagnosis present

## 2015-01-04 DIAGNOSIS — B9620 Unspecified Escherichia coli [E. coli] as the cause of diseases classified elsewhere: Secondary | ICD-10-CM | POA: Diagnosis not present

## 2015-01-04 DIAGNOSIS — S062X9A Diffuse traumatic brain injury with loss of consciousness of unspecified duration, initial encounter: Secondary | ICD-10-CM | POA: Diagnosis not present

## 2015-01-04 DIAGNOSIS — G935 Compression of brain: Secondary | ICD-10-CM

## 2015-01-04 DIAGNOSIS — S06306A Unspecified focal traumatic brain injury with loss of consciousness greater than 24 hours without return to pre-existing conscious level with patient surviving, initial encounter: Secondary | ICD-10-CM | POA: Diagnosis present

## 2015-01-04 DIAGNOSIS — Y249XXA Unspecified firearm discharge, undetermined intent, initial encounter: Secondary | ICD-10-CM

## 2015-01-04 DIAGNOSIS — J9600 Acute respiratory failure, unspecified whether with hypoxia or hypercapnia: Secondary | ICD-10-CM

## 2015-01-04 DIAGNOSIS — W3400XA Accidental discharge from unspecified firearms or gun, initial encounter: Secondary | ICD-10-CM

## 2015-01-04 DIAGNOSIS — T85598A Other mechanical complication of other gastrointestinal prosthetic devices, implants and grafts, initial encounter: Secondary | ICD-10-CM

## 2015-01-04 LAB — COMPREHENSIVE METABOLIC PANEL
ALBUMIN: 3.2 g/dL — AB (ref 3.5–5.2)
ALK PHOS: 45 U/L (ref 39–117)
ALT: 11 U/L (ref 0–35)
ALT: 11 U/L (ref 0–35)
ANION GAP: 15 (ref 5–15)
AST: 23 U/L (ref 0–37)
AST: 22 U/L (ref 0–37)
Albumin: 3.7 g/dL (ref 3.5–5.2)
Alkaline Phosphatase: 56 U/L (ref 39–117)
Anion gap: 7 (ref 5–15)
BILIRUBIN TOTAL: 0.6 mg/dL (ref 0.3–1.2)
BUN: 14 mg/dL (ref 6–23)
BUN: 13 mg/dL (ref 6–23)
CHLORIDE: 107 mmol/L (ref 96–112)
CO2: 18 mmol/L — ABNORMAL LOW (ref 19–32)
CO2: 25 mmol/L (ref 19–32)
CREATININE: 0.91 mg/dL (ref 0.50–1.10)
Calcium: 8.6 mg/dL (ref 8.4–10.5)
Calcium: 8.1 mg/dL — ABNORMAL LOW (ref 8.4–10.5)
Chloride: 107 mmol/L (ref 96–112)
Creatinine, Ser: 0.61 mg/dL (ref 0.50–1.10)
GFR calc Af Amer: >90 mL/min (ref >90)
GFR calc non Af Amer: 88 mL/min — ABNORMAL LOW (ref >90)
GLUCOSE: 143 mg/dL — AB (ref 70–99)
Glucose, Bld: 151 mg/dL — ABNORMAL HIGH (ref 70–99)
Potassium: 3.6 mmol/L (ref 3.5–5.1)
Potassium: 3.5 mmol/L (ref 3.5–5.1)
SODIUM: 140 mmol/L (ref 135–145)
SODIUM: 139 mmol/L (ref 135–145)
TOTAL PROTEIN: 7.1 g/dL (ref 6.0–8.3)
TOTAL PROTEIN: 6.2 g/dL (ref 6.0–8.3)
Total Bilirubin: 0.3 mg/dL (ref 0.3–1.2)

## 2015-01-04 LAB — CBC
HCT: 37.2 % (ref 36.0–46.0)
HEMATOCRIT: 32 % — AB (ref 36.0–46.0)
Hemoglobin: 12 g/dL (ref 12.0–15.0)
Hemoglobin: 10.4 g/dL — ABNORMAL LOW (ref 12.0–15.0)
MCH: 30.7 pg (ref 26.0–34.0)
MCH: 30.1 pg (ref 26.0–34.0)
MCHC: 32.3 g/dL (ref 30.0–36.0)
MCHC: 32.5 g/dL (ref 30.0–36.0)
MCV: 95.1 fL (ref 78.0–100.0)
MCV: 92.5 fL (ref 78.0–100.0)
PLATELETS: 308 10*3/uL (ref 150–400)
Platelets: 421 10*3/uL — ABNORMAL HIGH (ref 150–400)
RBC: 3.91 MIL/uL (ref 3.87–5.11)
RBC: 3.46 MIL/uL — AB (ref 3.87–5.11)
RDW: 14.7 % (ref 11.5–15.5)
RDW: 14.6 % (ref 11.5–15.5)
WBC: 20.2 10*3/uL — AB (ref 4.0–10.5)
WBC: 21 10*3/uL — ABNORMAL HIGH (ref 4.0–10.5)

## 2015-01-04 LAB — PREPARE FRESH FROZEN PLASMA
UNIT DIVISION: 0
UNIT DIVISION: 0

## 2015-01-04 LAB — TYPE AND SCREEN
ABO/RH(D): A POS
Antibody Screen: NEGATIVE
UNIT DIVISION: 0
Unit division: 0

## 2015-01-04 LAB — PROTIME-INR
INR: 0.97 (ref 0.00–1.49)
Prothrombin Time: 13 seconds (ref 11.6–15.2)

## 2015-01-04 LAB — I-STAT CHEM 8, ED
BUN: 15 mg/dL (ref 6–23)
CHLORIDE: 108 mmol/L (ref 96–112)
Calcium, Ion: 1.1 mmol/L — ABNORMAL LOW (ref 1.12–1.23)
Creatinine, Ser: 0.8 mg/dL (ref 0.50–1.10)
Glucose, Bld: 142 mg/dL — ABNORMAL HIGH (ref 70–99)
HCT: 38 % (ref 36.0–46.0)
Hemoglobin: 12.9 g/dL (ref 12.0–15.0)
Potassium: 3.5 mmol/L (ref 3.5–5.1)
SODIUM: 142 mmol/L (ref 135–145)
TCO2: 16 mmol/L (ref 0–100)

## 2015-01-04 LAB — ETHANOL

## 2015-01-04 LAB — MRSA PCR SCREENING: MRSA by PCR: NEGATIVE

## 2015-01-04 LAB — I-STAT CG4 LACTIC ACID, ED: LACTIC ACID, VENOUS: 6.9 mmol/L — AB (ref 0.5–2.0)

## 2015-01-04 LAB — ABO/RH: ABO/RH(D): A POS

## 2015-01-04 LAB — CDS SEROLOGY

## 2015-01-04 MED ORDER — HYDROMORPHONE HCL 1 MG/ML IJ SOLN
1.0000 mg | INTRAMUSCULAR | Status: DC | PRN
  Administered 2015-01-05 – 2015-01-14 (×51): 1 mg via INTRAVENOUS
  Filled 2015-01-04 (×52): qty 1

## 2015-01-04 MED ORDER — CETYLPYRIDINIUM CHLORIDE 0.05 % MT LIQD
7.0000 mL | Freq: Four times a day (QID) | OROMUCOSAL | Status: DC
  Administered 2015-01-04 – 2015-01-12 (×33): 7 mL via OROMUCOSAL

## 2015-01-04 MED ORDER — LORAZEPAM 2 MG/ML IJ SOLN
1.0000 mg | INTRAMUSCULAR | Status: DC | PRN
  Administered 2015-01-07 – 2015-01-12 (×3): 1 mg via INTRAVENOUS
  Filled 2015-01-04 (×3): qty 1

## 2015-01-04 MED ORDER — ONDANSETRON HCL 4 MG/2ML IJ SOLN: 4.0000 mg | Freq: Four times a day (QID) | INTRAMUSCULAR | Status: DC | PRN

## 2015-01-04 MED ORDER — PANTOPRAZOLE SODIUM 40 MG PO TBEC
40.0000 mg | DELAYED_RELEASE_TABLET | Freq: Every day | ORAL | Status: DC
  Administered 2015-01-13 – 2015-01-14 (×2): 40 mg via ORAL
  Filled 2015-01-04 (×2): qty 1

## 2015-01-04 MED ORDER — ONDANSETRON HCL 4 MG PO TABS: 4.0000 mg | ORAL_TABLET | Freq: Four times a day (QID) | ORAL | Status: DC | PRN

## 2015-01-04 MED ORDER — LORAZEPAM 2 MG/ML IJ SOLN
1.0000 mg | INTRAMUSCULAR | Status: DC | PRN
  Administered 2015-01-04: 1 mg via INTRAVENOUS
  Filled 2015-01-04: qty 1

## 2015-01-04 MED ORDER — CHLORHEXIDINE GLUCONATE 0.12 % MT SOLN
15.0000 mL | Freq: Two times a day (BID) | OROMUCOSAL | Status: DC
  Administered 2015-01-04 – 2015-01-14 (×20): 15 mL via OROMUCOSAL
  Filled 2015-01-04 (×24): qty 15

## 2015-01-04 MED ORDER — SUCCINYLCHOLINE CHLORIDE 20 MG/ML IJ SOLN
INTRAMUSCULAR | Status: AC | PRN
  Administered 2015-01-04: 100 mg via INTRAVENOUS

## 2015-01-04 MED ORDER — DEXTROSE-NACL 5-0.9 % IV SOLN
INTRAVENOUS | Status: DC
  Administered 2015-01-04 – 2015-01-05 (×3): via INTRAVENOUS

## 2015-01-04 MED ORDER — PROPOFOL 10 MG/ML IV EMUL: 5.0000 ug/kg/min | Freq: Once | INTRAVENOUS | Status: AC

## 2015-01-04 MED ORDER — ETOMIDATE 2 MG/ML IV SOLN
INTRAVENOUS | Status: AC | PRN
  Administered 2015-01-04: 20 mg via INTRAVENOUS

## 2015-01-04 MED ORDER — PANTOPRAZOLE SODIUM 40 MG IV SOLR
40.0000 mg | Freq: Every day | INTRAVENOUS | Status: DC
  Administered 2015-01-04 – 2015-01-12 (×9): 40 mg via INTRAVENOUS
  Filled 2015-01-04 (×13): qty 40

## 2015-01-04 MED ORDER — PROPOFOL 10 MG/ML IV EMUL
5.0000 ug/kg/min | INTRAVENOUS | Status: DC
  Administered 2015-01-04 (×2): 80 ug/kg/min via INTRAVENOUS
  Administered 2015-01-04 (×2): 60 ug/kg/min via INTRAVENOUS
  Administered 2015-01-04: 80 ug/kg/min via INTRAVENOUS
  Administered 2015-01-04: 70 ug/kg/min via INTRAVENOUS
  Administered 2015-01-04: 80 ug/kg/min via INTRAVENOUS
  Administered 2015-01-05: 40 ug/kg/min via INTRAVENOUS
  Administered 2015-01-05: 50 ug/kg/min via INTRAVENOUS
  Administered 2015-01-05: 60 ug/kg/min via INTRAVENOUS
  Administered 2015-01-05 (×2): 50 ug/kg/min via INTRAVENOUS
  Administered 2015-01-05: 55 ug/kg/min via INTRAVENOUS
  Administered 2015-01-06: 60 ug/kg/min via INTRAVENOUS
  Administered 2015-01-06: 30 ug/kg/min via INTRAVENOUS
  Administered 2015-01-06 (×3): 60 ug/kg/min via INTRAVENOUS
  Administered 2015-01-07: 50 ug/kg/min via INTRAVENOUS
  Administered 2015-01-07: 80 ug/kg/min via INTRAVENOUS
  Administered 2015-01-07: 60 ug/kg/min via INTRAVENOUS
  Administered 2015-01-07: 80 ug/kg/min via INTRAVENOUS
  Administered 2015-01-07: 40 ug/kg/min via INTRAVENOUS
  Administered 2015-01-07 (×2): 60 ug/kg/min via INTRAVENOUS
  Administered 2015-01-08 (×5): 80 ug/kg/min via INTRAVENOUS
  Filled 2015-01-04 (×27): qty 100

## 2015-01-04 NOTE — ED Notes (Signed)
Propofol rate changed to 30 mcg/kg/min

## 2015-01-04 NOTE — ED Notes (Signed)
Propofol rate changed to 25 mcg/kg/min
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
## 2015-01-04 NOTE — Progress Notes (Signed)
Spoke to Dollar GeneralDetective Montgomery.  He said it was ok for family to see the pt.  Mother Kennis Carina(Yulita), father Ethelene Browns(Anthony) and aunt Delaney Meigs(Tamara) were escorted back with the chaplain.  I reviewed the visitation policy and the importance of not giving out the password.  The mother agreed to accompany any family members and requested no other visitors be allowed.
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
## 2015-01-04 NOTE — Progress Notes (Signed)
Patient's pupils are now working. They're 6 mm but responsive. Her right gaze is down and out suggesting a third nerve palsy on the right. She has a mild right corneal reflex but no left corneal reflex. She has no gag. When off of propofol she moves all extremities. I had a long talk with her mother today. I tried to answer all of her questions. We will continue supportive care for now.
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
## 2015-01-04 NOTE — ED Notes (Signed)
XRAY called and informed of xray needed to confirm OG placement.

## 2015-01-04 NOTE — ED Notes (Signed)
Propofol rate changed to 10 mcg/kg/min.

## 2015-01-04 NOTE — ED Notes (Signed)
Propofol rate changed to 20mcg/kg/min.  
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
## 2015-01-04 NOTE — Progress Notes (Signed)
Dr. Yetta BarreJones assessed the pt and said it was OK to remove her collar.
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
## 2015-01-04 NOTE — ED Notes (Signed)
Propofol rate changed to 15mcg/kg/min 

## 2015-01-04 NOTE — ED Notes (Signed)
No ortho consult.
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
## 2015-01-04 NOTE — ED Notes (Signed)
Propofol started at 5mcg/kg/min

## 2015-01-04 NOTE — ED Notes (Signed)
Neurosurgery at [facility]

## 2015-01-05 LAB — TRIGLYCERIDES: TRIGLYCERIDES: 35 mg/dL (ref <150)

## 2015-01-05 MED ORDER — PRO-STAT SUGAR FREE PO LIQD
60.0000 mL | Freq: Two times a day (BID) | ORAL | Status: DC
  Administered 2015-01-05 (×2): 60 mL
  Filled 2015-01-05 (×4): qty 60

## 2015-01-05 MED ORDER — ADULT MULTIVITAMIN W/MINERALS CH
1.0000 | ORAL_TABLET | Freq: Every day | ORAL | Status: DC
  Administered 2015-01-05 – 2015-01-14 (×6): 1
  Filled 2015-01-05 (×10): qty 1

## 2015-01-05 MED ORDER — PIVOT 1.5 CAL PO LIQD
1000.0000 mL | ORAL | Status: DC
Start: 2015-01-05 — End: 2015-01-06
  Administered 2015-01-05: 1000 mL
  Filled 2015-01-05 (×3): qty 1000
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
## 2015-01-05 NOTE — Progress Notes (Signed)
No change in exam. Pupils are now equal and reactive. She does have a down and out deviation to the right eye suggestive of a partial third nerve palsy. Still weak gag and corneals if at all. will arouse when propofol is off and move all extremities. Following.

## 2015-01-05 NOTE — Progress Notes (Signed)
Chaplain initiated follow up with pt and family. Chaplain offered her continued support. Chaplain will continue to follow.    01/05/15 1000  Clinical Encounter Type  Visited With Family  Visit Type Follow-up;Spiritual support  Spiritual Encounters  Spiritual Needs Emotional;Prayer  Jiles HaroldStamey, Elandra Powell F, Chaplain 01/05/2015 10:51 AM
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
## 2015-01-06 ENCOUNTER — Inpatient Hospital Stay (HOSPITAL_COMMUNITY): Payer: Medicaid Other

## 2015-01-06 LAB — GLUCOSE, CAPILLARY
GLUCOSE-CAPILLARY: 102 mg/dL — AB (ref 70–99)
GLUCOSE-CAPILLARY: 99 mg/dL (ref 70–99)
Glucose-Capillary: 113 mg/dL — ABNORMAL HIGH (ref 70–99)
Glucose-Capillary: 106 mg/dL — ABNORMAL HIGH (ref 70–99)
Glucose-Capillary: 89 mg/dL (ref 70–99)

## 2015-01-06 MED ORDER — PRO-STAT SUGAR FREE PO LIQD
60.0000 mL | Freq: Two times a day (BID) | ORAL | Status: DC
  Administered 2015-01-06 – 2015-01-08 (×5): 60 mL via ORAL
  Filled 2015-01-06 (×6): qty 60

## 2015-01-06 MED ORDER — VITAL 1.5 CAL PO LIQD
1000.0000 mL | ORAL | Status: DC
  Filled 2015-01-06: qty 1000

## 2015-01-06 MED ORDER — SODIUM CHLORIDE 0.9 % IV SOLN
INTRAVENOUS | Status: DC
  Administered 2015-01-06 – 2015-01-14 (×15): via INTRAVENOUS
  Filled 2015-01-06 (×24): qty 1000

## 2015-01-06 MED ORDER — PIVOT 1.5 CAL PO LIQD
1000.0000 mL | ORAL | Status: DC
  Administered 2015-01-06 – 2015-01-08 (×3): 1000 mL
  Filled 2015-01-06 (×5): qty 1000
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
## 2015-01-06 NOTE — Progress Notes (Signed)
UR completed. Await therapy evals after extubation to determine pt's next level of care need at d/c.  Carlyle LipaMichelle Monserrath Junio, RN BSN MHA CCM Trauma/Neuro ICU Case Manager [phone]
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
## 2015-01-06 NOTE — Progress Notes (Signed)
LCSW followed up with patient mother who is in room at time of follow up. Mom reports information needed that LCSW attempted to obtain yesterday. Patient child's fathers name is on the birth certificate and there is no current custody agreement (per law) in place. As of right now, child was living with mother with mother allowing father visitation per his request.  They have never formalized a plan as father per grandmother has only been involved a handful of times. Grandmother reports concerns of father wanting to take child and she is interested in emergency custody of child. [place] Law was explained to mother that father has rights to child (50%) and he has a right to have child. If concerns are known as mother grandmother reports she can involved CPS to investigate the situation and voice concerns.  LCSW discussed option of legal aid involvement or getting an attorney to represent child in custody manner. LCSW printed off Legal Aid packet for grandmother to review regarding filing for custody of child (nonparent)  All information given to patient mother and all questions answered.  Deretha EmoryHannah Xylia Scherger LCSW, MSW Clinical Social Work: Emergency Room [phone]

## 2015-01-06 NOTE — Progress Notes (Signed)
Patient ID: Leah ShieldsShanese Xxxclark, female   DOB: 12/08/1990, 24 y.o.   MRN: [id number] Eval for extubation by RT shows no cuff leak. She does have a good amount of tongue and neck edema. Hold off on extubation for today and resume TF. Try again when edema down. Leah GelinasBurke [name], MD, MPH, FACS Trauma: [phone] General Surgery: [phone]

## 2015-01-07 ENCOUNTER — Inpatient Hospital Stay (HOSPITAL_COMMUNITY): Payer: Medicaid Other

## 2015-01-07 LAB — CBC
HEMATOCRIT: 28.9 % — AB (ref 36.0–46.0)
Hemoglobin: 9.2 g/dL — ABNORMAL LOW (ref 12.0–15.0)
MCH: 30.3 pg (ref 26.0–34.0)
MCHC: 31.8 g/dL (ref 30.0–36.0)
MCV: 95.1 fL (ref 78.0–100.0)
Platelets: 247 10*3/uL (ref 150–400)
RBC: 3.04 MIL/uL — ABNORMAL LOW (ref 3.87–5.11)
RDW: 14.8 % (ref 11.5–15.5)
WBC: 13.1 10*3/uL — AB (ref 4.0–10.5)

## 2015-01-07 LAB — BASIC METABOLIC PANEL
Anion gap: 8 (ref 5–15)
BUN: 13 mg/dL (ref 6–23)
CALCIUM: 8.2 mg/dL — AB (ref 8.4–10.5)
CO2: 26 mmol/L (ref 19–32)
CREATININE: 0.54 mg/dL (ref 0.50–1.10)
Chloride: 108 mmol/L (ref 96–112)
Glucose, Bld: 101 mg/dL — ABNORMAL HIGH (ref 70–99)
Potassium: 3.8 mmol/L (ref 3.5–5.1)
Sodium: 142 mmol/L (ref 135–145)

## 2015-01-07 LAB — GLUCOSE, CAPILLARY
GLUCOSE-CAPILLARY: 107 mg/dL — AB (ref 70–99)
GLUCOSE-CAPILLARY: 88 mg/dL (ref 70–99)
Glucose-Capillary: 101 mg/dL — ABNORMAL HIGH (ref 70–99)
Glucose-Capillary: 101 mg/dL — ABNORMAL HIGH (ref 70–99)
Glucose-Capillary: 80 mg/dL (ref 70–99)
Glucose-Capillary: 93 mg/dL (ref 70–99)

## 2015-01-07 LAB — BLOOD PRODUCT ORDER (VERBAL) VERIFICATION
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
## 2015-01-07 NOTE — Progress Notes (Signed)
SLP Cancellation Note  Patient Details Name: Gillian ShieldsShanese Xxxclark MRN: [id number] DOB: 08/28/1991   Cancelled:      Extubated. Per OT, plans to extubate tomorrow. Will follow up.   [name], Daltyn Degroat Willis 01/07/2015, 10:11 AM Breck CoonsLisa Willis [name] M.Ed [employer] Pager [phone]

## 2015-01-07 NOTE — Progress Notes (Signed)
Chaplain initiated follow up with pt family. Pt family member asking about counseling service information provided by CSW yesterday. Chaplain encouraged family member to reach out to CSW for more information today. Chaplain will continue to follow.   01/07/15 0900  Clinical Encounter Type  Visited With Family  Visit Type Follow-up;Spiritual support  Spiritual Encounters  Spiritual Needs Emotional  Scarlett PrestoStamey, Loa SocksCourtney F, Chaplain 01/07/2015 9:37 AM

## 2015-01-07 NOTE — Progress Notes (Signed)
Follow up - Trauma and Critical Care  Patient Details:    Leah Rojas is an 24 y.o. female.  Lines/tubes : Airway 7.5 mm (Active)  Secured at (cm) 22 cm 01/07/2015  7:55 AM  Measured From Lips 01/07/2015  7:55 AM  Secured Location Center 01/07/2015  7:55 AM  Secured By [employer] 01/07/2015  7:55 AM  Tube Holder Repositioned Yes 01/07/2015  7:55 AM  Cuff Pressure (cm H2O) 25 cm H2O 01/07/2015 12:01 AM  Site Condition Dry 01/07/2015  4:33 AM     NG/OG Tube Orogastric 14 Fr. Left mouth (Active)  Placement Verification Auscultation 01/06/2015  8:00 PM  Site Assessment Clean;Dry;Intact 01/06/2015  8:00 PM  Status Infusing tube feed 01/06/2015  8:00 PM  Drainage Appearance Bile 01/05/2015  8:00 AM  Gastric Residual 0 mL 01/06/2015  8:00 PM  Output (mL) 215 mL 01/05/2015  3:45 PM     Urethral Catheter T. Davonna Belling, RN Non-latex;Straight-tip (Active)  Indication for Insertion or Continuance of Catheter Unstable critical patients (first 24-48 hours) 01/07/2015  8:00 AM  Site Assessment Clean;Intact;Dry 01/06/2015  8:00 PM  Catheter Maintenance Bag below level of bladder;Catheter secured;Drainage bag/tubing not touching floor;Insertion date on drainage bag;No dependent loops;Seal intact;Bag emptied prior to transport 01/07/2015  8:00 AM  Collection Container Standard drainage bag 01/06/2015  8:00 PM  Securement Method Securing device (Describe) 01/06/2015  8:00 PM  Urinary Catheter Interventions Unclamped 01/05/2015  7:40 PM  Output (mL) 225 mL 01/07/2015  7:30 AM    Microbiology/Sepsis markers: Results for orders placed or performed during the hospital encounter of 01/04/15  MRSA PCR Screening     Status: None   Collection Time: 01/04/15  2:59 AM  Result Value Ref Range Status   MRSA by PCR NEGATIVE NEGATIVE Final    Comment:        The GeneXpert MRSA Assay (FDA approved for NASAL specimens only), is one component of a comprehensive MRSA colonization surveillance program. It is  not intended to diagnose MRSA infection nor to guide or monitor treatment for MRSA infections.     Anti-infectives:  Anti-infectives    None      Best Practice/Protocols:  VTE Prophylaxis: Mechanical GI Prophylaxis: Proton Pump Inhibitor Continous Sedation  Consults: Treatment Team:  Tia Alert, MD    Events:  Subjective:    Overnight Issues: Patient on continuous sedation.    Objective:  Vital signs for last 24 hours: Temp:  [97.8 F (36.6 C)-99.9 F (37.7 C)] 97.8 F (36.6 C) (04/13 0800) Pulse Rate:  [80-100] 83 (04/13 0800) Resp:  [10-20] 17 (04/13 0800) BP: (100-123)/(50-74) 120/67 mmHg (04/13 0800) SpO2:  [99 %-100 %] 100 % (04/13 0800) FiO2 (%):  [30 %] 30 % (04/13 0755) Weight:  [73.8 kg (162 lb 11.2 oz)] 73.8 kg (162 lb 11.2 oz) (04/13 0413)  Hemodynamic parameters for last 24 hours:    Intake/Output from previous day: 04/12 0701 - 04/13 0700 In: 3112.4 [I.V.:2652.4; NG/GT:460] Out: 2830 [Urine:2830]  Intake/Output this shift: Total I/O In: 319.8 [I.V.:239.8; NG/GT:80] Out: 225 [Urine:225]  Vent settings for last 24 hours: Vent Mode:  [-] PSV;Spontaneous FiO2 (%):  [30 %] 30 % Set Rate:  [10 bmp] 10 bmp Vt Set:  [500 mL] 500 mL PEEP:  [5 cmH20-55 cmH20] 5 cmH20 Pressure Support:  [5 cmH20-8 cmH20] 5 cmH20 Plateau Pressure:  [13 cmH20-17 cmH20] 16 cmH20  Physical Exam:  General: no respiratory distress and awakens intermittently Neuro: nonfocal exam, RASS -1 and follows  commands. Resp: clear to auscultation bilaterally CVS: regular rate and rhythm, S1, S2 normal, no murmur, click, rub or gallop GI: soft, nontender, BS WNL, no r/g and tolerating tube feedings. Extremities: edema 1+ and some edema in her hands.  Results for orders placed or performed during the hospital encounter of 01/04/15 (from the past 24 hour(s))  Glucose, capillary     Status: None   Collection Time: 01/06/15 11:25 AM  Result Value Ref Range    Glucose-Capillary 99 70 - 99 mg/dL   Comment 1 Notify RN    Comment 2 Document in Chart   Glucose, capillary     Status: None   Collection Time: 01/06/15  3:18 PM  Result Value Ref Range   Glucose-Capillary 89 70 - 99 mg/dL   Comment 1 Notify RN    Comment 2 Document in Chart   Glucose, capillary     Status: None   Collection Time: 01/06/15 11:44 PM  Result Value Ref Range   Glucose-Capillary 93 70 - 99 mg/dL   Comment 1 Notify RN   CBC     Status: Abnormal   Collection Time: 01/07/15  2:20 AM  Result Value Ref Range   WBC 13.1 (H) 4.0 - 10.5 K/uL   RBC 3.04 (L) 3.87 - 5.11 MIL/uL   Hemoglobin 9.2 (L) 12.0 - 15.0 g/dL   HCT 16.128.9 (L) 09.636.0 - 04.546.0 %   MCV 95.1 78.0 - 100.0 fL   MCH 30.3 26.0 - 34.0 pg   MCHC 31.8 30.0 - 36.0 g/dL   RDW 40.914.8 81.111.5 - 91.415.5 %   Platelets 247 150 - 400 K/uL  Basic metabolic panel     Status: Abnormal   Collection Time: 01/07/15  2:20 AM  Result Value Ref Range   Sodium 142 135 - 145 mmol/L   Potassium 3.8 3.5 - 5.1 mmol/L   Chloride 108 96 - 112 mmol/L   CO2 26 19 - 32 mmol/L   Glucose, Bld 101 (H) 70 - 99 mg/dL   BUN 13 6 - 23 mg/dL   Creatinine, Ser 7.820.54 0.50 - 1.10 mg/dL   Calcium 8.2 (L) 8.4 - 10.5 mg/dL   GFR calc non Af Amer >90 >90 mL/min   GFR calc Af Amer >90 >90 mL/min   Anion gap 8 5 - 15  Glucose, capillary     Status: None   Collection Time: 01/07/15  3:34 AM  Result Value Ref Range   Glucose-Capillary 80 70 - 99 mg/dL   Comment 1 Notify RN   Glucose, capillary     Status: Abnormal   Collection Time: 01/07/15  8:07 AM  Result Value Ref Range   Glucose-Capillary 107 (H) 70 - 99 mg/dL   Comment 1 Notify RN    Comment 2 Document in Chart      Assessment/Plan:   NEURO  Altered Mental Status:  agitation, change in mental status and will follow commands.   Plan: Continue to allow the patient to awaken  PULM  No clinical issues.  No CXR today.   Plan: CPM, try to wean and extubate when tongue swelling improved.  CARDIO  No  issues   Plan: CPM  RENAL  Urine output is good   Plan: CPM  GI  Tolerating tube feedings well.  OGT appears to be pulled back a bit.  KUB shows that it has come back, but not out of the stomach.  Needs to be pushed in a bit more.   Plan:  Push OGT in about 2 cm  ID  No known infectious sources.   Plan:  BUllet site needs cleaning  HEME  Anemia acute blood loss anemia)   Plan: Mild.  Needs no blood  ENDO No known problems   Plan: CPM  Global Issues  Patient able to follow commands for me today.  Only reason we are not extubating the patient is because of the amount of necka nd tongue swelling.  Also the patient had no cuff leakage.  If the tongue is kept in the mouth I believe that the swelling will go down    LOS: 3 days   Additional comments:I reviewed the patient's new clinical lab test results. cbc/bmet and I reviewed the patients new imaging test results. KUB  Critical Care Total Time*: 30 Minutes  Sarahmarie Leavey, JAY 01/07/2015  *Care during the described time interval was provided by me and/or other providers on the critical care team.  I have reviewed this patient's available data, including medical history, events of note, physical examination and test results as part of my evaluation.

## 2015-01-07 NOTE — Progress Notes (Signed)
OT Cancellation Note  Patient Details Name: Leah Rojas MRN: [id number] DOB: 10/27/1990   Cancelled Treatment:    Reason Eval/Treat Not Completed: Patient not medically ready Still intubated. Plan to extubate tomorrow. Will assess when appropriate. [facility] Center[facility]ILLARY  Chiffon Kittleson, OTR/L  [phone] 01/07/2015 01/07/2015, 9:47 AM

## 2015-01-07 NOTE — Progress Notes (Signed)
PT Cancellation Note  Patient Details Name: Leah ShieldsShanese Xxxclark MRN: [id number] DOB: 12/14/1990   Cancelled Treatment:    Reason Eval/Treat Not Completed: Patient not medically ready.  Will f/u tomorrow for TBI team eval.     Lajean Boese, Alison MurrayMegan F 01/07/2015, 10:30 AM

## 2015-01-08 LAB — GLUCOSE, CAPILLARY
GLUCOSE-CAPILLARY: 101 mg/dL — AB (ref 70–99)
GLUCOSE-CAPILLARY: 112 mg/dL — AB (ref 70–99)
GLUCOSE-CAPILLARY: 132 mg/dL — AB (ref 70–99)
GLUCOSE-CAPILLARY: 90 mg/dL (ref 70–99)
GLUCOSE-CAPILLARY: 93 mg/dL (ref 70–99)
Glucose-Capillary: 93 mg/dL (ref 70–99)
Glucose-Capillary: 117 mg/dL — ABNORMAL HIGH (ref 70–99)
Glucose-Capillary: 126 mg/dL — ABNORMAL HIGH (ref 70–99)

## 2015-01-08 LAB — TRIGLYCERIDES: Triglycerides: 57 mg/dL (ref <150)

## 2015-01-08 MED ORDER — PRO-STAT SUGAR FREE PO LIQD
60.0000 mL | Freq: Three times a day (TID) | ORAL | Status: DC
  Administered 2015-01-08 – 2015-01-12 (×2): 60 mL
  Filled 2015-01-08 (×16): qty 60

## 2015-01-08 MED ORDER — METHYLPREDNISOLONE SODIUM SUCC 125 MG IJ SOLR
125.0000 mg | Freq: Four times a day (QID) | INTRAMUSCULAR | Status: AC
  Administered 2015-01-08 (×3): 125 mg via INTRAVENOUS
  Filled 2015-01-08 (×3): qty 2

## 2015-01-08 MED ORDER — PROPOFOL 1000 MG/100ML IV EMUL
5.0000 ug/kg/min | INTRAVENOUS | Status: DC
  Administered 2015-01-08 – 2015-01-09 (×5): 80 ug/kg/min via INTRAVENOUS

## 2015-01-08 NOTE — Progress Notes (Signed)
No cuff leak heard at this time
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
## 2015-01-08 NOTE — Progress Notes (Signed)
Patient ID: Leah ShieldsShanese Xxxclark, female   DOB: 05/31/1991, 24 y.o.   MRN: [id number] Failed cuff leak again. Will give solumedrol for 3 doses and re-eval tomorrow. Leah GelinasBurke Tenzin Edelman, MD, MPH, FACS Trauma: [phone] General Surgery: [phone]

## 2015-01-08 NOTE — Progress Notes (Signed)
OT Cancellation Note  Patient Details Name: Gillian ShieldsShanese Xxxclark MRN: [id number] DOB: 04/15/1991   Cancelled Treatment:    Reason Eval/Treat Not Completed: Other (comment);Patient's level of consciousness Pt seen earlier by PT/ST. Agitated after session and now sedated. Will attempt to see in the am. [facility] Center[facility]  Karlis Cregg, OTR/L  [phone] 01/08/2015 01/08/2015, 11:57 AM

## 2015-01-08 NOTE — Progress Notes (Signed)
Remains sedated and intubated. No change in exam. following
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
## 2015-01-08 NOTE — Progress Notes (Signed)
Rehab Admissions Coordinator Note:  Patient was screened by Trish MageLogue, Ayriana Wix M for appropriateness for an Inpatient Acute Rehab Consult.  At this time, we are recommending Inpatient Rehab consult.  However, patient currently on vent.  Recommend rehab consult order once patient is off the vent.  Trish MageLogue, Ruffus Kamaka M 01/08/2015, 1:52 PM  I can be reached at [phone].
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
## 2015-01-09 LAB — BASIC METABOLIC PANEL
Anion gap: 8 (ref 5–15)
BUN: 20 mg/dL (ref 6–23)
CALCIUM: 8.5 mg/dL (ref 8.4–10.5)
CHLORIDE: 108 mmol/L (ref 96–112)
CO2: 25 mmol/L (ref 19–32)
Creatinine, Ser: 0.36 mg/dL — ABNORMAL LOW (ref 0.50–1.10)
GFR calc Af Amer: >90 mL/min (ref >90)
GFR calc non Af Amer: >90 mL/min (ref >90)
Glucose, Bld: 137 mg/dL — ABNORMAL HIGH (ref 70–99)
Potassium: 4.1 mmol/L (ref 3.5–5.1)
Sodium: 141 mmol/L (ref 135–145)

## 2015-01-09 LAB — CBC
HCT: 27.6 % — ABNORMAL LOW (ref 36.0–46.0)
Hemoglobin: 8.6 g/dL — ABNORMAL LOW (ref 12.0–15.0)
MCH: 29.1 pg (ref 26.0–34.0)
MCHC: 31.2 g/dL (ref 30.0–36.0)
MCV: 93.2 fL (ref 78.0–100.0)
Platelets: 295 10*3/uL (ref 150–400)
RBC: 2.96 MIL/uL — ABNORMAL LOW (ref 3.87–5.11)
RDW: 14.2 % (ref 11.5–15.5)
WBC: 12.3 10*3/uL — ABNORMAL HIGH (ref 4.0–10.5)

## 2015-01-09 LAB — GLUCOSE, CAPILLARY
Glucose-Capillary: 127 mg/dL — ABNORMAL HIGH (ref 70–99)
Glucose-Capillary: 116 mg/dL — ABNORMAL HIGH (ref 70–99)
Glucose-Capillary: 101 mg/dL — ABNORMAL HIGH (ref 70–99)
Glucose-Capillary: 87 mg/dL (ref 70–99)
Glucose-Capillary: 104 mg/dL — ABNORMAL HIGH (ref 70–99)

## 2015-01-09 MED ORDER — RACEPINEPHRINE HCL 2.25 % IN NEBU
INHALATION_SOLUTION | RESPIRATORY_TRACT | Status: AC
  Filled 2015-01-09: qty 0.5

## 2015-01-09 NOTE — Procedures (Signed)
Extubation Procedure Note  Patient Details:   Name: Leah Rojas DOB: 01/08/1991 MRN: [id number]   Airway Documentation:     Evaluation  O2 sats: stable throughout Complications: No apparent complications Patient did tolerate procedure well. Bilateral Breath Sounds: Clear, Diminished Suctioning: Oral, Airway Yes   Pt extubated to 4L [place]. Pt has no cuff leak but ok to extubate per Dr Lindie SpruceWyatt. Pt has weak cough. RT will continue to manage and monitor. Pt unable to speak but is following simple commands such as sticking out tongue. Pt stable throughout with no complications  Carolan ShiverKelley, Emileo Semel M 01/09/2015, 12:15 PM
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
## 2015-01-09 NOTE — Progress Notes (Signed)
Overall no change. Following.

## 2015-01-09 NOTE — Progress Notes (Signed)
UR completed.  Zackerie Sara, RN BSN MHA CCM Trauma/Neuro ICU Case Manager [phone]  

## 2015-01-09 NOTE — Progress Notes (Signed)
Pt thrashing legs up and down on bed, very agitated, impeding wean. Propofol restarted at 50%.

## 2015-01-09 NOTE — Clinical Social Work Note (Signed)
Clinical Social Worker continuing to follow patient and family for support and discharge planning needs.  CSW spoke with patient mother at bedside who is awaiting Artistinancial Counselor to complete Medicaid paperwork.  Patient mother understanding of possible inpatient rehab need prior to return home.  Patient with plenty of family support to assist as needed once discharged home.  Patient mother with no additional questions at this time.  CSW remains available for support and to facilitate patient discharge needs once medically ready.  Macario GoldsJesse Jianni Batten, KentuckyLCSW [phone]

## 2015-01-09 NOTE — Progress Notes (Signed)
No cuff leak noted at this time. Dr Lindie SpruceWyatt notified

## 2015-01-10 LAB — URINALYSIS, ROUTINE W REFLEX MICROSCOPIC
BILIRUBIN URINE: NEGATIVE
Glucose, UA: NEGATIVE mg/dL
KETONES UR: 15 mg/dL — AB
Nitrite: NEGATIVE
Protein, ur: NEGATIVE mg/dL
Specific Gravity, Urine: 1.018 (ref 1.005–1.030)
Urobilinogen, UA: 2 mg/dL — ABNORMAL HIGH (ref 0.0–1.0)
pH: 7.5 (ref 5.0–8.0)

## 2015-01-10 LAB — GLUCOSE, CAPILLARY
GLUCOSE-CAPILLARY: 79 mg/dL (ref 70–99)
Glucose-Capillary: 87 mg/dL (ref 70–99)
Glucose-Capillary: 94 mg/dL (ref 70–99)
Glucose-Capillary: 78 mg/dL (ref 70–99)
Glucose-Capillary: 78 mg/dL (ref 70–99)
Glucose-Capillary: 70 mg/dL (ref 70–99)
Glucose-Capillary: 78 mg/dL (ref 70–99)

## 2015-01-10 LAB — URINE MICROSCOPIC-ADD ON

## 2015-01-10 MED ORDER — BISACODYL 10 MG RE SUPP
10.0000 mg | Freq: Every day | RECTAL | Status: DC | PRN
  Administered 2015-01-10: 10 mg via RECTAL
  Filled 2015-01-10 (×2): qty 1
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
## 2015-01-10 NOTE — Progress Notes (Signed)
Patient ID: Leah ShieldsShanese Rojas, female   DOB: 04/11/1991, 24 y.o.   MRN: [id number] Improved neurologic function patient is awake and follows commands all 4 extremities
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
## 2015-01-11 LAB — GLUCOSE, CAPILLARY
GLUCOSE-CAPILLARY: 70 mg/dL (ref 70–99)
Glucose-Capillary: 75 mg/dL (ref 70–99)
Glucose-Capillary: 68 mg/dL — ABNORMAL LOW (ref 70–99)
Glucose-Capillary: 63 mg/dL — ABNORMAL LOW (ref 70–99)
Glucose-Capillary: 104 mg/dL — ABNORMAL HIGH (ref 70–99)
Glucose-Capillary: 85 mg/dL (ref 70–99)
Glucose-Capillary: 74 mg/dL (ref 70–99)

## 2015-01-11 LAB — TRIGLYCERIDES: Triglycerides: 107 mg/dL (ref <150)

## 2015-01-11 MED ORDER — DEXTROSE 50 % IV SOLN
25.0000 mL | Freq: Once | INTRAVENOUS | Status: AC
  Administered 2015-01-11: 25 mL via INTRAVENOUS

## 2015-01-11 MED ORDER — DEXTROSE 50 % IV SOLN
INTRAVENOUS | Status: AC
  Filled 2015-01-11: qty 50
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
## 2015-01-12 ENCOUNTER — Inpatient Hospital Stay (HOSPITAL_COMMUNITY): Payer: Medicaid Other

## 2015-01-12 LAB — GLUCOSE, CAPILLARY
GLUCOSE-CAPILLARY: 74 mg/dL (ref 70–99)
Glucose-Capillary: 71 mg/dL (ref 70–99)
Glucose-Capillary: 71 mg/dL (ref 70–99)
Glucose-Capillary: 95 mg/dL (ref 70–99)
Glucose-Capillary: 81 mg/dL (ref 70–99)
Glucose-Capillary: 84 mg/dL (ref 70–99)

## 2015-01-12 MED ORDER — RESOURCE THICKENUP CLEAR PO POWD
ORAL | Status: DC | PRN
  Filled 2015-01-12: qty 125

## 2015-01-12 MED ORDER — LEVOFLOXACIN IN D5W 750 MG/150ML IV SOLN
750.0000 mg | INTRAVENOUS | Status: DC
  Administered 2015-01-12: 750 mg via INTRAVENOUS
  Filled 2015-01-12 (×2): qty 150
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
## 2015-01-12 NOTE — Progress Notes (Signed)
PT Cancellation Note  Patient Details Name: Leah Rojas MRN: [id number] DOB: 08/21/1991   Cancelled Treatment:    Reason Eval/Treat Not Completed: Patient at procedure or test/unavailable   Dusan Lipford F 01/12/2015, 11:05 AM

## 2015-01-12 NOTE — Progress Notes (Addendum)
Pt' s Mom is not at bedside. Per RN she has left to get pt's 25 yo daughter. I will contact pt's Mom to arrange a time to meet to begin discussions of rehab venue options. [phone] I have arranged to meet with pt's Mom tomorrow at 2 pm .

## 2015-01-12 NOTE — Progress Notes (Signed)
eLink Physician-Brief Progress Note Patient Name: Leah ShieldsShanese Xxxclark DOB: 05/09/1991 MRN: [id number]   Date of Service  01/12/2015  HPI/Events of Note  Urine culture from 01/10/2015 is positive for E. Coli (>100K cfu/mL). NKDA.  eICU Interventions  Will order Levaquin IV. Can be converted to PO when her swallowing improves.     Intervention Category Intermediate Interventions: Infection - evaluation and management  Henriette Hesser Eugene 01/12/2015, 3:13 PM
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
## 2015-01-12 NOTE — Progress Notes (Signed)
UR completed.  Potential CIR at d/c.   Carlyle LipaMichelle Rielly Brunn, RN BSN MHA CCM Trauma/Neuro ICU Case Manager [phone]

## 2015-01-12 NOTE — Progress Notes (Signed)
Trauma Service Note  Subjective: Patient currently follows commands, but very hoarse and did not pass swallowing evaluation yesterday.  Objective: Vital signs in last 24 hours: Temp:  [97.6 F (36.4 C)-99.1 F (37.3 C)] 97.6 F (36.4 C) (04/18 0700) Pulse Rate:  [61-77] 63 (04/18 0700) Resp:  [10-21] 17 (04/18 0700) BP: (108-127)/(53-86) 127/66 mmHg (04/18 0700) SpO2:  [93 %-100 %] 99 % (04/18 0700) Weight:  [67.4 kg (148 lb 9.4 oz)] 67.4 kg (148 lb 9.4 oz) (04/18 0308) Last BM Date: 01/10/15  Intake/Output from previous day: 04/17 0701 - 04/18 0700 In: 2000 [I.V.:2000] Out: -  Intake/Output this shift:    General: No acute distress.    Lungs: Coarse breath sounds bilaterally.  Abd: Soft, good bowel sounds..  Benign  Extremities: No changes  Neuro: Possible aphasia, intermittently agitated.  Lab Results: CBC  No results for input(s): WBC, HGB, HCT, PLT in the last 72 hours. BMET No results for input(s): NA, K, CL, CO2, GLUCOSE, BUN, CREATININE, CALCIUM in the last 72 hours. PT/INR No results for input(s): LABPROT, INR in the last 72 hours. ABG No results for input(s): PHART, HCO3 in the last 72 hours.  Invalid input(s): PCO2, PO2  Studies/Results: No results found.  Anti-infectives: Anti-infectives    None      Assessment/Plan: s/p  Patient needs some type of nutrition if she is not about to swallow adequately.  Will get a modified barium swallow today?  Will not start tube feedings or TPN until after her next evaluation.  LOS: 8 days   Marta LamasJames O. Gae BonWyatt, III, MD, FACS [phone] Trauma Surgeon 01/12/2015

## 2015-01-13 LAB — URINE CULTURE

## 2015-01-13 LAB — GLUCOSE, CAPILLARY
GLUCOSE-CAPILLARY: 77 mg/dL (ref 70–99)
GLUCOSE-CAPILLARY: 100 mg/dL — AB (ref 70–99)
Glucose-Capillary: 69 mg/dL — ABNORMAL LOW (ref 70–99)
Glucose-Capillary: 90 mg/dL (ref 70–99)

## 2015-01-13 MED ORDER — ENSURE PUDDING PO PUDG
1.0000 | Freq: Three times a day (TID) | ORAL | Status: DC
  Administered 2015-01-13 – 2015-01-14 (×2): 1 via ORAL

## 2015-01-13 MED ORDER — TRAMADOL HCL 50 MG PO TABS
50.0000 mg | ORAL_TABLET | Freq: Four times a day (QID) | ORAL | Status: DC
  Administered 2015-01-13 – 2015-01-14 (×5): 50 mg via ORAL
  Filled 2015-01-13 (×5): qty 1

## 2015-01-13 MED ORDER — LEVOFLOXACIN 750 MG PO TABS
750.0000 mg | ORAL_TABLET | Freq: Every day | ORAL | Status: DC
  Administered 2015-01-13 – 2015-01-14 (×2): 750 mg via ORAL
  Filled 2015-01-13 (×2): qty 1

## 2015-01-13 MED ORDER — CETYLPYRIDINIUM CHLORIDE 0.05 % MT LIQD
7.0000 mL | Freq: Two times a day (BID) | OROMUCOSAL | Status: DC
  Administered 2015-01-13 – 2015-01-14 (×2): 7 mL via OROMUCOSAL

## 2015-01-13 NOTE — Progress Notes (Signed)
I met with pt's Mom and Aunt in conference room to discuss inpt rehab venue. They are both in agreement and asking appropriate questions. I am hopeful for an inpt rehab bed in the next few days. I will keep RN CM and SW aware . I will follow up in the morning. [phone]

## 2015-01-13 NOTE — Clinical Social Work Note (Signed)
Clinical Social Worker continuing to follow patient and family for support and discharge planning needs.  Patient mother is planning to meet with inpatient rehab admissions coordinator at 2pm to discuss possible admission and support at home upon discharge.  Patient following commands and answering questions appropriately, however not appropriate for SBIRT completion.  Patient mother does not express any additional concerns at this time.  CSW remains available for support as needed.  Macario GoldsJesse Eithan Beagle, KentuckyLCSW [phone]

## 2015-01-13 NOTE — Progress Notes (Signed)
Pt has order to transfer to med-surg. Pt is still very impulsive and a high fall risk, sitter is not available for tonight. Receiving floor 4N does not have a bed near nurses station. Spoke to Dr Derrell Lollingamirez about pt transferring to IP rehab tomorrow. Says pt can remain in stepdown for tonight, near nurses station to ensure safety and will move to rehab tomorrow.

## 2015-01-13 NOTE — Progress Notes (Signed)
A bed is available for this pt tomorrow for admission to inpt rehab. I have contacted pt's Mom and she is aware as well as RN on 3S. I will follow up in the morning. [phone]
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
## 2015-01-14 ENCOUNTER — Inpatient Hospital Stay (HOSPITAL_COMMUNITY)
Admission: RE | Admit: 2015-01-14 | Discharge: 2015-02-13 | DRG: 083 | Disposition: A | Discharge status: Home Health | Payer: Medicaid Other | Source: Intra-hospital | Attending: Physical Medicine & Rehabilitation | Admitting: Physical Medicine & Rehabilitation

## 2015-01-14 DIAGNOSIS — S069X5S Unspecified intracranial injury with loss of consciousness greater than 24 hours with return to pre-existing conscious level, sequela: Secondary | ICD-10-CM

## 2015-01-14 DIAGNOSIS — B9620 Unspecified Escherichia coli [E. coli] as the cause of diseases classified elsewhere: Secondary | ICD-10-CM | POA: Diagnosis not present

## 2015-01-14 DIAGNOSIS — S062X9A Diffuse traumatic brain injury with loss of consciousness of unspecified duration, initial encounter: Secondary | ICD-10-CM | POA: Diagnosis present

## 2015-01-14 DIAGNOSIS — D62 Acute posthemorrhagic anemia: Secondary | ICD-10-CM | POA: Diagnosis present

## 2015-01-14 DIAGNOSIS — S06306A Unspecified focal traumatic brain injury with loss of consciousness greater than 24 hours without return to pre-existing conscious level with patient surviving, initial encounter: Secondary | ICD-10-CM | POA: Diagnosis present

## 2015-01-14 DIAGNOSIS — M25512 Pain in left shoulder: Secondary | ICD-10-CM

## 2015-01-14 DIAGNOSIS — S06309D Unspecified focal traumatic brain injury with loss of consciousness of unspecified duration, subsequent encounter: Secondary | ICD-10-CM

## 2015-01-14 DIAGNOSIS — R1310 Dysphagia, unspecified: Secondary | ICD-10-CM | POA: Diagnosis not present

## 2015-01-14 DIAGNOSIS — Y9289 Other specified places as the place of occurrence of the external cause: Secondary | ICD-10-CM

## 2015-01-14 DIAGNOSIS — F09 Unspecified mental disorder due to known physiological condition: Secondary | ICD-10-CM

## 2015-01-14 DIAGNOSIS — W3400XA Accidental discharge from unspecified firearms or gun, initial encounter: Secondary | ICD-10-CM

## 2015-01-14 DIAGNOSIS — T148 Other injury of unspecified body region: Secondary | ICD-10-CM

## 2015-01-14 DIAGNOSIS — R112 Nausea with vomiting, unspecified: Secondary | ICD-10-CM

## 2015-01-14 DIAGNOSIS — N390 Urinary tract infection, site not specified: Secondary | ICD-10-CM

## 2015-01-14 DIAGNOSIS — R52 Pain, unspecified: Secondary | ICD-10-CM

## 2015-01-14 DIAGNOSIS — S0291XK Unspecified fracture of skull, subsequent encounter for fracture with nonunion: Secondary | ICD-10-CM

## 2015-01-14 DIAGNOSIS — R1110 Vomiting, unspecified: Secondary | ICD-10-CM

## 2015-01-14 LAB — BASIC METABOLIC PANEL
Anion gap: 11 (ref 5–15)
BUN: 8 mg/dL (ref 6–23)
CO2: 24 mmol/L (ref 19–32)
Calcium: 8.7 mg/dL (ref 8.4–10.5)
Chloride: 99 mmol/L (ref 96–112)
Creatinine, Ser: 0.5 mg/dL (ref 0.50–1.10)
GFR calc Af Amer: >90 mL/min (ref >90)
Glucose, Bld: 83 mg/dL (ref 70–99)
POTASSIUM: 4.1 mmol/L (ref 3.5–5.1)
Sodium: 134 mmol/L — ABNORMAL LOW (ref 135–145)

## 2015-01-14 LAB — GLUCOSE, CAPILLARY
GLUCOSE-CAPILLARY: 79 mg/dL (ref 70–99)
Glucose-Capillary: 80 mg/dL (ref 70–99)
Glucose-Capillary: 79 mg/dL (ref 70–99)

## 2015-01-14 LAB — TRIGLYCERIDES: TRIGLYCERIDES: 81 mg/dL (ref <150)

## 2015-01-14 MED ORDER — ADULT MULTIVITAMIN W/MINERALS CH: 1.0000 | ORAL_TABLET | Freq: Every day | ORAL | Status: DC

## 2015-01-14 MED ORDER — SORBITOL 70 % SOLN
30.0000 mL | Freq: Every day | Status: DC | PRN
  Administered 2015-01-19: 30 mL via ORAL
  Filled 2015-01-14 (×3): qty 30

## 2015-01-14 MED ORDER — ONDANSETRON HCL 4 MG PO TABS
4.0000 mg | ORAL_TABLET | Freq: Four times a day (QID) | ORAL | Status: DC | PRN
  Administered 2015-02-03 – 2015-02-11 (×2): 4 mg via ORAL
  Filled 2015-01-14 (×4): qty 1

## 2015-01-14 MED ORDER — PANTOPRAZOLE SODIUM 40 MG IV SOLR
40.0000 mg | Freq: Every day | INTRAVENOUS | Status: DC
  Filled 2015-01-14 (×6): qty 40

## 2015-01-14 MED ORDER — LEVOFLOXACIN 750 MG PO TABS
750.0000 mg | ORAL_TABLET | Freq: Every day | ORAL | Status: DC
  Filled 2015-01-14 (×2): qty 1

## 2015-01-14 MED ORDER — ACETAMINOPHEN 325 MG PO TABS
325.0000 mg | ORAL_TABLET | ORAL | Status: DC | PRN
  Administered 2015-01-15: 650 mg via ORAL
  Filled 2015-01-14 (×2): qty 2

## 2015-01-14 MED ORDER — LEVOFLOXACIN 750 MG PO TABS: 750.0000 mg | ORAL_TABLET | Freq: Every day | ORAL | Status: DC

## 2015-01-14 MED ORDER — BISACODYL 10 MG RE SUPP: 10.0000 mg | Freq: Every day | RECTAL | Status: DC | PRN

## 2015-01-14 MED ORDER — ADULT MULTIVITAMIN W/MINERALS CH
1.0000 | ORAL_TABLET | Freq: Every day | ORAL | Status: DC
  Administered 2015-01-16 – 2015-02-13 (×14): 1
  Filled 2015-01-14 (×34): qty 1

## 2015-01-14 MED ORDER — PANTOPRAZOLE SODIUM 40 MG PO TBEC
40.0000 mg | DELAYED_RELEASE_TABLET | Freq: Every day | ORAL | Status: DC
  Filled 2015-01-14: qty 1

## 2015-01-14 MED ORDER — LORAZEPAM 2 MG/ML IJ SOLN: 0.5000 mg | Freq: Four times a day (QID) | INTRAMUSCULAR | Status: DC | PRN

## 2015-01-14 MED ORDER — CETYLPYRIDINIUM CHLORIDE 0.05 % MT LIQD: 7.0000 mL | Freq: Two times a day (BID) | OROMUCOSAL | Status: DC

## 2015-01-14 MED ORDER — ENSURE PUDDING PO PUDG
1.0000 | Freq: Three times a day (TID) | ORAL | Status: DC
  Administered 2015-01-18 – 2015-02-12 (×8): 1 via ORAL
  Filled 2015-01-14 (×56): qty 1

## 2015-01-14 MED ORDER — RESOURCE THICKENUP CLEAR PO POWD
ORAL | Status: DC | PRN
  Filled 2015-01-14: qty 125

## 2015-01-14 MED ORDER — DIVALPROEX SODIUM 250 MG PO DR TAB
250.0000 mg | DELAYED_RELEASE_TABLET | Freq: Two times a day (BID) | ORAL | Status: DC
Start: 2015-01-14 — End: 2015-01-17
  Administered 2015-01-15 – 2015-01-17 (×3): 250 mg via ORAL
  Filled 2015-01-14 (×8): qty 1

## 2015-01-14 MED ORDER — TRAMADOL HCL 50 MG PO TABS
50.0000 mg | ORAL_TABLET | Freq: Four times a day (QID) | ORAL | Status: DC
  Administered 2015-01-14 – 2015-01-25 (×23): 50 mg via ORAL
  Filled 2015-01-14 (×36): qty 1

## 2015-01-14 MED ORDER — SODIUM CHLORIDE 0.45 % IV SOLN: INTRAVENOUS | Status: DC

## 2015-01-14 MED ORDER — ONDANSETRON HCL 4 MG/2ML IJ SOLN: 4.0000 mg | Freq: Four times a day (QID) | INTRAMUSCULAR | Status: DC | PRN

## 2015-01-14 MED ORDER — LEVOFLOXACIN 750 MG PO TABS
750.0000 mg | ORAL_TABLET | ORAL | Status: AC
  Administered 2015-01-14 – 2015-01-15 (×2): 750 mg via ORAL
  Filled 2015-01-14 (×2): qty 1

## 2015-01-14 MED ORDER — CHLORHEXIDINE GLUCONATE 0.12 % MT SOLN
15.0000 mL | Freq: Two times a day (BID) | OROMUCOSAL | Status: DC
  Administered 2015-01-16 – 2015-02-13 (×13): 15 mL via OROMUCOSAL
  Filled 2015-01-14 (×62): qty 15

## 2015-01-14 MED ORDER — LORAZEPAM 0.5 MG PO TABS: 0.5000 mg | ORAL_TABLET | Freq: Four times a day (QID) | ORAL | Status: DC | PRN
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
## 2015-01-14 NOTE — Progress Notes (Signed)
Patient information reviewed and entered into eRehab system by Lithzy Bernard, RN, CRRN, PPS Coordinator.  Information including medical coding and functional independence measure will be reviewed and updated through discharge.    

## 2015-01-14 NOTE — Clinical Social Work Note (Signed)
Clinical Social Worker continuing to follow patient and family for support and discharge planning needs.  Patient has a bed available at inpatient rehab today.  Patient and patient family agreeable with discharge plan and transfer for today.  Clinical Social Worker will sign off for now as social work intervention is no longer needed. Please consult us again if new need arises.  Macario GoldsJesse Veron Senner, [place] [phone]

## 2015-01-14 NOTE — Progress Notes (Signed)
Pt c/o ongoing severe left chest pain extending down arm. Says she felt it all throughout night and pain med didn't give much relief. EKG normal, sinus rhythm. Paged Carolynne Edouardoth, MD, says he will see pt shortly.

## 2015-01-14 NOTE — Progress Notes (Signed)
Pt admitted to unit at 1645 with family at bedside. Reviewed rehab booklet, process, and safety plan with family and pt. Pt in enclosure bed with call bell within reach
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
## 2015-01-14 NOTE — Progress Notes (Signed)
Inpt rehab bed is available for this pt today. Trauma PA, RN CM, SW and RN aware. I will meet with pt's Mom at 12 noon today to make the arrangements. I discussed with pt that we would be moving her to our rehab center today. [phone]
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
## 2015-01-14 NOTE — Progress Notes (Signed)
[facility]. Pt VSS, neuro intact and impulsive at baseline, all personal belongings with family at bedside. CCMD notified of discharge and monitor removed. IV remained in. AVS sent with pt and family verbalizes understanding.

## 2015-01-15 ENCOUNTER — Inpatient Hospital Stay (HOSPITAL_COMMUNITY): Payer: Medicaid Other | Admitting: Physical Therapy

## 2015-01-15 ENCOUNTER — Inpatient Hospital Stay (HOSPITAL_COMMUNITY): Payer: Medicaid Other | Admitting: Speech Pathology

## 2015-01-15 ENCOUNTER — Inpatient Hospital Stay (HOSPITAL_COMMUNITY): Payer: Medicaid Other | Admitting: Occupational Therapy

## 2015-01-15 ENCOUNTER — Inpatient Hospital Stay (HOSPITAL_COMMUNITY): Payer: Medicaid Other

## 2015-01-15 DIAGNOSIS — S06306S Unspecified focal traumatic brain injury with loss of consciousness greater than 24 hours without return to pre-existing conscious level with patient surviving, sequela: Secondary | ICD-10-CM

## 2015-01-15 LAB — COMPREHENSIVE METABOLIC PANEL
ALT: 23 U/L (ref 0–35)
ANION GAP: 13 (ref 5–15)
AST: 15 U/L (ref 0–37)
Albumin: 3 g/dL — ABNORMAL LOW (ref 3.5–5.2)
Alkaline Phosphatase: 57 U/L (ref 39–117)
BILIRUBIN TOTAL: 0.5 mg/dL (ref 0.3–1.2)
BUN: 9 mg/dL (ref 6–23)
CHLORIDE: 97 mmol/L (ref 96–112)
CO2: 24 mmol/L (ref 19–32)
Calcium: 9.1 mg/dL (ref 8.4–10.5)
Creatinine, Ser: 0.55 mg/dL (ref 0.50–1.10)
GFR calc Af Amer: >90 mL/min (ref >90)
Glucose, Bld: 89 mg/dL (ref 70–99)
Potassium: 4.1 mmol/L (ref 3.5–5.1)
Sodium: 134 mmol/L — ABNORMAL LOW (ref 135–145)
Total Protein: 7.6 g/dL (ref 6.0–8.3)

## 2015-01-15 LAB — CBC WITH DIFFERENTIAL/PLATELET
Basophils Absolute: 0 10*3/uL (ref 0.0–0.1)
Basophils Relative: 0 % (ref 0–1)
EOS PCT: 1 % (ref 0–5)
Eosinophils Absolute: 0.1 10*3/uL (ref 0.0–0.7)
HCT: 33.7 % — ABNORMAL LOW (ref 36.0–46.0)
Hemoglobin: 11.5 g/dL — ABNORMAL LOW (ref 12.0–15.0)
LYMPHS ABS: 1.8 10*3/uL (ref 0.7–4.0)
Lymphocytes Relative: 15 % (ref 12–46)
MCH: 30.5 pg (ref 26.0–34.0)
MCHC: 34.1 g/dL (ref 30.0–36.0)
MCV: 89.4 fL (ref 78.0–100.0)
Monocytes Absolute: 1.1 10*3/uL — ABNORMAL HIGH (ref 0.1–1.0)
Monocytes Relative: 10 % (ref 3–12)
NEUTROS ABS: 8.6 10*3/uL — AB (ref 1.7–7.7)
Neutrophils Relative %: 74 % (ref 43–77)
PLATELETS: 570 10*3/uL — AB (ref 150–400)
RBC: 3.77 MIL/uL — ABNORMAL LOW (ref 3.87–5.11)
RDW: 13.8 % (ref 11.5–15.5)
WBC: 11.6 10*3/uL — AB (ref 4.0–10.5)

## 2015-01-15 MED ORDER — TRAZODONE HCL 50 MG PO TABS
50.0000 mg | ORAL_TABLET | Freq: Every day | ORAL | Status: DC
  Administered 2015-01-15 – 2015-01-19 (×4): 50 mg via ORAL
  Filled 2015-01-15 (×5): qty 1
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
## 2015-01-15 NOTE — Progress Notes (Signed)
Physical Therapy Session Note  Patient Details  Name: Leah Rojas MRN: [id number] Date of Birth: 12/14/1990  Today's Date: 01/15/2015 PT Individual Time: 1400-1435 PT Individual Time Calculation (min): 35 min   Skilled Therapeutic Interventions/Progress Updates:  Session focused on arousal, initiation, participation, and pt/family education. Patient received asleep in enclosure bed. Patient verbally responding to therapists but maintained eyes closed 50 > 75% of time. Patient required max verbal cues for managing secretions with decreased speech intelligibility noted. Patient required max-total multimodal assist x 25 minutes to get out of bed, including coaxing, negotiation, therapist initiating bringing BLE to edge of bed and attempting to elevate trunk with patient resisting, and playing music. With music, patient sang lyrics to song and danced while supine in bed with eyes closed but did not initiate supine > sit. Patient's mother arrived and encouraged patient to sit up. With more than reasonable amount of time, patient sat edge of bed and mom donned shoes. Patient impulsively attempted to stand without assist, requiring min-mod A x 2 for safety to ambulate to bathroom. Patient continent of urine and performed all toileting tasks with supervision. Patient washed hands from recliner with min cues for sequencing. Education provided regarding roles of therapy, therapy schedule, and safety in room. Patient left sitting in recliner with quick release belt on with mom and OT present.   Therapy Documentation Precautions:  Precautions Precautions: Fall Restrictions Weight Bearing Restrictions: No Pain: Pain Assessment Pain Assessment: Faces Pain Score: Asleep Faces Pain Scale: Hurts a little bit Pain Intervention(s): Medication (See eMAR) (scheduled pain medication)  Therapy/Group: Individual Therapy  Kerney ElbeVarner, Osker Ayoub A 01/15/2015, 2:59 PM

## 2015-01-16 ENCOUNTER — Inpatient Hospital Stay (HOSPITAL_COMMUNITY): Payer: Self-pay | Admitting: Occupational Therapy

## 2015-01-16 ENCOUNTER — Inpatient Hospital Stay (HOSPITAL_COMMUNITY): Payer: Medicaid Other | Admitting: Physical Therapy

## 2015-01-16 ENCOUNTER — Inpatient Hospital Stay (HOSPITAL_COMMUNITY): Payer: Self-pay | Admitting: Physical Therapy

## 2015-01-16 MED ORDER — CETYLPYRIDINIUM CHLORIDE 0.05 % MT LIQD
7.0000 mL | Freq: Two times a day (BID) | OROMUCOSAL | Status: DC
  Administered 2015-01-22 – 2015-02-03 (×3): 7 mL via OROMUCOSAL

## 2015-01-16 NOTE — Progress Notes (Signed)
Speech Language Pathology Daily Session Note  Patient Details  Name: Leah Rojas MRN: [id number] Date of Birth: 11/20/1990  Today's Date: 01/16/2015 SLP Individual Time: 1300-1330 SLP Individual Time Calculation (min): 30 min  Short Term Goals: Week 1: SLP Short Term Goal 1 (Week 1): Patient will focus attention to functional tasks for ~60 seconds with Max A multimodal cues.  SLP Short Term Goal 2 (Week 1): Patient will identify 1 physical and 1 cognitive deficit with Max  A multimodal cues.  SLP Short Term Goal 3 (Week 1): Patient will intiate functional tasks with Max A multimodal cues.  SLP Short Term Goal 4 (Week 1): Patient will recall new, daily information with use of external aids with Max A multimodal cues.  SLP Short Term Goal 5 (Week 1): Patient will self-monitor and correct oral holding and anterior spillage of saliva with Max A multimodal cues.  SLP Short Term Goal 6 (Week 1): Patient will consume current diet with minimal overt s/s of aspiration with Max A multimodal cues for use of swallow strategies.   Skilled Therapeutic Interventions:  Pt was seen for skilled ST targeting cognitive goals.  Upon arrival, pt was asleep in enclosure bed but was briefly awakened to voice and light touch.  Pt became verbally agitated with SLP upon attempts to awaken and repeatedly asked SLP to leave.  SLP attempted to engage pt in functional conversations related to family pictures left in pt's room to facilitate participation in therapy; however, pt flung picture across the room.   Pt was noted with poor management of secretions with copious amounts of saliva spilling out of her oral cavity.  Pt self-initiated one instance of wiping secretions away from her chin but otherwise required total assist for secretion management either via wiping them away with a wash cloth or clearing throat/swallowing saliva.  Pt was highly unintelligible due to wet vocal quality, lethargy, and agitation.  Pt also  refused trials of honey thick Dr. Reino KentPepper.  As a result, pt missed 30 minutes of skilled ST due to refusal to participate due to lethargy.  Continue per current plan of care.    FIM:  Comprehension Comprehension Mode: Auditory Comprehension: 4-Understands basic 75 - 89% of the time/requires cueing 10 - 24% of the time Expression Expression Mode: Verbal Expression: 2-Expresses basic 25 - 49% of the time/requires cueing 50 - 75% of the time. Uses single words/gestures. Social Interaction Social Interaction: 1-Interacts appropriately less than 25% of the time. May be withdrawn or combative. Problem Solving Problem Solving: 1-Solves basic less than 25% of the time - needs direction nearly all the time or does not effectively solve problems and may need a restraint for safety Memory Memory: 2-Recognizes or recalls 25 - 49% of the time/requires cueing 51 - 75% of the time FIM - Eating Eating Activity: 5: Supervision/cues;5: Set-up assist for open containers  Pain Pain Assessment Pain Assessment: Faces Faces Pain Scale: No hurt   Therapy/Group: Individual Therapy  Trayden Brandy, Melanee SpryNicole L 01/16/2015, 4:21 PM
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
## 2015-01-16 NOTE — Care Management Note (Signed)
Inpatient Rehabilitation Center Individual Statement of Services  Patient Name:  Leah ShieldsShanese Rojas  Date:  01/16/2015  Welcome to the Inpatient Rehabilitation [facility].  Our goal is to provide you with an individualized program based on your diagnosis and situation, designed to meet your specific needs.  With this comprehensive rehabilitation program, you will be expected to participate in at least 3 hours of rehabilitation therapies Monday-Friday, with modified therapy programming on the weekends.  Your rehabilitation program will include the following services:  Physical Therapy (PT), Occupational Therapy (OT), Speech Therapy (ST), 24 hour per day rehabilitation nursing, Therapeutic Recreaction (TR), Neuropsychology, Case Management (Social Worker), Rehabilitation Medicine, Nutrition Services and Pharmacy Services  Weekly team conferences will be held on Tuesdays to discuss your progress.  Your Social Worker will talk with you frequently to get your input and to update you on team discussions.  Team conferences with you and your family in attendance may also be held.  Expected length of stay: 18-21 days  Overall anticipated outcome: supervision  Depending on your progress and recovery, your program may change. Your Social Worker will coordinate services and will keep you informed of any changes. Your Social Worker's name and contact numbers are listed  below.  The following services may also be recommended but are not provided by the Inpatient Rehabilitation Center:   Driving Evaluations  Home Health Rehabiltiation Services  Outpatient Rehabilitation Services  Vocational Rehabilitation   Arrangements will be made to provide these services after discharge if needed.  Arrangements include referral to agencies that provide these services.  Your insurance has been verified to be:  None on admission.  Medicaid application pending now. Your primary doctor is:  None (Child psychotherapistocial Worker will help you  establish primary care MD)  Pertinent information will be shared with your doctor and your insurance company.  Social Worker:  LoganLucy Amaan Meyer, [place] [phone] or [phone]) [phone]   Information discussed with and copy given to patient by: [name], [name], 01/16/2015, 3:07 PM
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
## 2015-01-16 NOTE — Progress Notes (Signed)
Physical Therapy Session Note  Patient Details  Name: Leah Rojas MRN: [id number] Date of Birth: 09/16/1991  Today's Date: 01/16/2015 PT Individual Time: 1130-1200 PT Individual Time Calculation (min): 30 min  and Today's Date: 01/16/2015 PT Co-Treatment Time: 1000 (one hour co-treat with OT)-1030 PT Co-Treatment Time Calculation (min): 30 min  Short Term Goals: Week 1:  PT Short Term Goal 1 (Week 1): Patient will sustain attention to functional task x 60 sec with max multimodal A.  PT Short Term Goal 2 (Week 1): Patient will initiate functional mobility tasks with mod multimodal A 50% of time.  PT Short Term Goal 3 (Week 1): Patient will perform dynamic standing balance x 3 min with max multimodal A.  PT Short Term Goal 4 (Week 1): Patient will perform bed mobility with min A 50% of time.  PT Short Term Goal 5 (Week 1): Patient will ambulate 150 ft with consistent mod A x 1.   Skilled Therapeutic Interventions/Progress Updates:    Cotreatment with COTA. Pt resting in enclosure bed upon arrival and required min-mod verbal cues and increasing the light in the environment for arousal and max encouragement to participate in therapy. Required tot A + 2 to facilitate and initiate patient sitting EOB. Pt continually attempted to lay back down in bed. To discourage falling back asleep, pt performed squat pivot transfer with mod A to recliner for oral care with suction. Pt able to follow one step commands and assisted approx 25% to perform oral care with COTA.  To continue to increase arousal and attention, pt amb with +2HHA throughout unit 100-150' at a time.  Pt require hands on assistance for balance and trunk control due to ataxia and trunk flexion and forward head/shoulders with eyes closed, requiring max verbal cues to hold head up and scan environment for obstacles. Pt partially complies with requests but unable to maintain eyes open; pt noted to be sensitive to light and once reporting  a picture in front of her was "moving". Will continue to assess.  Pt given multiple sitting rest breaks and engaged in therapeutic conversation but then cued to begin walking again as her arousal and attention decreased.  Pt also performed up/down 4 stairs with bilat hand rails and min A overall with pt selecting step to negotiation sequence. During walk to dayroom pt decided to start sitting down (due to therapist's encouragement to continue ambulating) and required tot A + 2 to rest on therapist's knees while w/c provided. Transitioned to day room in w/c where she engaged in self feeding task at table.  Pt required max verbal cues for portion control and rate. Pt required multiple rest breaks during self feeding.Pt ambulated safely back to her room with +2 HHA and returned to enclosure bed to rest with family present.  2nd session: Pt's mother now present with rest of family.  Pt initially strongly refusing to wake up or transition to sitting EOB.  Pt's mother had new clothes with her and reporting she needed to return the clothes the pt was wearing due to small size.  Engaged pt in clothing doffing and donning in the bed (due to continued refusal to get OOB) with max-total verbal cues and encouragement with therapist completely doffing pt's pants.  Pt was able to doff shirt and don new shirt and pants with extra time.  After changing clothes engaged pt in sustained attention task of looking at pictures of family answering 5-6 questions about each picture; pt able to answer questions about  2 pictures before falling back to sleep.  Pt left in enclosure bed with lights off to rest.  Family asking about incorporating them into therapy and about fixing the patient's hair during a therapy session.  Will pass message along to primary team.  Family states they are more than willing to participate in therapy sessions as well as step back when needed.      Therapy Documentation Precautions:   Precautions Precautions: Fall Restrictions Weight Bearing Restrictions: No General: PT Amount of Missed Time (min): 5 Minutes PT Missed Treatment Reason: Patient fatigue  Pain: Pain Assessment Pain Assessment: Faces Faces Pain Scale: Hurts even more Pain Type: Acute pain Pain Location: Arm Pain Orientation: Left Pain Descriptors / Indicators: Crying Pain Onset: Other (Comment) (when touched) Pain Intervention(s): Rest;Repositioned Locomotion : Ambulation Ambulation/Gait Assistance: 1: +2 Total assist   See FIM for current functional status  Therapy/Group: Individual Therapy and Co-Treatment  Leah CircleHall, Leah Rojas Leah Rojas 01/16/2015, 12:31 PM

## 2015-01-16 NOTE — Plan of Care (Signed)
Problem: RH BOWEL ELIMINATION Goal: RH STG MANAGE BOWEL WITH ASSISTANCE STG Manage Bowel with mod Assistance.  Outcome: Not Progressing LBM 01-10-15  Goal: RH STG MANAGE BOWEL W/MEDICATION W/ASSISTANCE STG Manage Bowel with Medication with min Assistance.  Outcome: Not Progressing LBM 01-10-15

## 2015-01-16 NOTE — Progress Notes (Signed)
Physical Therapy Session Note  Patient Details  Name: Leah ShieldsShanese Xxxclark MRN: [id number] Date of Birth: 08/08/1991  Today's Date: 01/16/2015 PT Individual Time: 1425-1450 PT Individual Time Calculation (min): 25 min   Short Term Goals: Week 1:  PT Short Term Goal 1 (Week 1): Patient will sustain attention to functional task x 60 sec with max multimodal A.  PT Short Term Goal 2 (Week 1): Patient will initiate functional mobility tasks with mod multimodal A 50% of time.  PT Short Term Goal 3 (Week 1): Patient will perform dynamic standing balance x 3 min with max multimodal A.  PT Short Term Goal 4 (Week 1): Patient will perform bed mobility with min A 50% of time.  PT Short Term Goal 5 (Week 1): Patient will ambulate 150 ft with consistent mod A x 1.   Skilled Therapeutic Interventions/Progress Updates:   Focus on arousal, initiation, and participation with therapeutic tasks. Patient asleep in enclosure bed upon arrival and required max multimodal cues and more than reasonable amount of time to sit edge of bed and max encouragement to participate in therapy. Therapist donned yellow socks with total A for safety. Patient stated multiple times throughout session, "Get away from me," "Don't touch me," and "Back up." Patient eventually stood from bed and ambulated to bathroom with mod A overall, forward trunk lean and ataxic gait. Performed toileting tasks with supervision and required max cues for hand hygiene. Patient ambulated to sink but did not complete hand hygiene and instead returned to bed. Patient followed one step commands 50% of time for positioning to allow nurse tech to take vitals and removed BP cuff on command. Patient refused to participate further in session, keeping eyes closed and pulling blankets up over her head. Patient left in enclosure bed with call bell within reach and lights off.   Therapy Documentation Precautions:  Precautions Precautions: Fall Restrictions Weight Bearing  Restrictions: No General: PT Amount of Missed Time (min): 35 Minutes PT Missed Treatment Reason: Patient unwilling to participate;Patient fatigue Vital Signs: Therapy Vitals Temp: 98.2 F (36.8 C) Temp Source: Oral Pulse Rate: 77 Resp: 18 BP: 131/66 mmHg Patient Position (if appropriate): Lying Oxygen Therapy SpO2: 100 % O2 Device: Not Delivered Pain: Pain Assessment Pain Assessment: Faces Faces Pain Scale: Hurts even more Pain Type: Acute pain Pain Location: Arm Pain Orientation: Left Pain Descriptors / Indicators: Crying Pain Onset: Other (Comment) (when touched) Pain Intervention(s): Rest;Repositioned Locomotion : Ambulation Ambulation/Gait Assistance: 3: Mod assist   See FIM for current functional status  Therapy/Group: Individual Therapy  Kerney ElbeVarner, Jhoana Upham A 01/16/2015, 3:07 PM

## 2015-01-16 NOTE — Progress Notes (Addendum)
Occupational Therapy Session Note  Patient Details  Name: Leah ShieldsShanese Rojas MRN: [id number] Date of Birth: 12/16/1990  Today's Date: 01/16/2015 OT Co-Treatment Time: 1030-1100 (cotx with PT-total time 1000-1100) OT Co-Treatment Time Calculation (min): 30 min   Short Term Goals: Week 1:  OT Short Term Goal 1 (Week 1): Pt will initiate 1 self care task with max cues OT Short Term Goal 2 (Week 1): Pt will complete bathing with mod assist and max cues for sequencing OT Short Term Goal 3 (Week 1): Pt will stand for 30 seconds during self-care task with min assist OT Short Term Goal 4 (Week 1): Pt will sustain attention to functional task for 30 seconds with max cues OT Short Term Goal 5 (Week 1): Pt will follow one-step commands during self-care task 75% of time with max cues  Skilled Therapeutic Interventions/Progress Updates:   Cotreatment with PT.  Pt resting in enclosure bed upon arrival and required min verbal cues for arousal and max encouragement to participate in therapy.  Pt required tot A + 2 to facilitate and initiate patient sitting EOB.  Pt continually attempted to lay back down in bed.  Pt performed squat pivot transfer to recliner for oral care with suction.  Pt assisted approx 25% to perform oral care.  Pt followed one step commands throughout process.  Pt amb with +2 for balance and trunk control.  Pt exhibits trunk flexion and forward head/shoulders while walking, requiring max verbal cues to hold head up.  Pt partially complies with requests.  Pt amb to small gym and back to day room with multiple rest breaks and continued max encouragement to participate.  Visual assessment attempted with inconsistent responses by patient.  Will continue to assess.  Pt transitioned to self feed D1 consistency food supplied by family.  Pt required max verbal cues for portion control and rate.  Pt required multiple rest breaks during 10 mins engaged in self feeding.  During walk back to room pt decided to  start sitting down and required tot A + 2 to rest on therapist's knees while w/c provided.  Pt returned to enclosure bed with family present.  Therapy Documentation Precautions:  Precautions Precautions: Fall Restrictions Weight Bearing Restrictions: No Pain: Pain Assessment Pain Assessment: Faces Faces Pain Scale: Hurts even more Pain Type: Acute pain Pain Location: Shoulder Pain Orientation: Left Pain Descriptors / Indicators: Aching;Tender;Sore Pain Onset: With Activity (painful to touch) Pain Intervention(s): Repositioned;Emotional support  See FIM for current functional status  Therapy/Group:Co-Treatment  Rich BraveLanier, Shaquoya Cosper Chappell 01/16/2015, 12:18 PM
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
## 2015-01-16 NOTE — Progress Notes (Signed)
Physical Therapy Session Note  Patient Details  Name: Leah ShieldsShanese Xxxclark MRN: [id number] Date of Birth: 05/25/1991  Today's Date: 01/16/2015 PT Individual Time: 0800-0855  PT Individual Time Calculation (min): 55 min Short Term Goals: Week 1:  PT Short Term Goal 1 (Week 1): Patient will sustain attention to functional task x 60 sec with max multimodal A.  PT Short Term Goal 2 (Week 1): Patient will initiate functional mobility tasks with mod multimodal A 50% of time.  PT Short Term Goal 3 (Week 1): Patient will perform dynamic standing balance x 3 min with max multimodal A.  PT Short Term Goal 4 (Week 1): Patient will perform bed mobility with min A 50% of time.  PT Short Term Goal 5 (Week 1): Patient will ambulate 150 ft with consistent mod A x 1.   Skilled Therapeutic Interventions/Progress Updates:  Session focused on arousal, sustained attention, initiation, sequencing, activity tolerance, and functional mobility. Pt received supine in enclosure bed requiring max multimodal cues and more than reasonable amount of time to sit EOB. With questioning, patient ambulated to bathroom with mod A to use toilet but sat on shower chair and initiated removal of clothing. Patient impulsively stood to walk to toilet and completed toileting tasks with steady assist. Patient returned to shower chair and required max verbal cues for initiating and sequencing bathing with close supervision due to perseveration and decreased attention. Patient stood x 1 in shower without warning to wash buttocks. Patient exited shower and ambulated to recliner with mod A. Patient required total assist to initiate dressing tasks but then was able to pull shirt over head and stand up to pull up underwear and pants after therapist threaded clothing except total A for donning bra. Patient followed one step commands during bathing and dressing 50% of time. Patient returned to bed for supine rest break. After resting, patient initiated  putting on deodorant supine in bed with min questioning cues and assist for thoroughness due to ataxia/decreased attention to task. Patient left in enclosure bed with needs within reach.   Therapy Documentation Precautions:  Precautions Precautions: Fall Restrictions Weight Bearing Restrictions: No Pain: Pain Assessment Pain Assessment: Faces Faces Pain Scale: No hurt  See FIM for current functional status  Therapy/Group: Individual Therapy  Kerney ElbeVarner, Charmaine Placido A 01/16/2015, 11:22 AM

## 2015-01-16 NOTE — Progress Notes (Signed)
Patient refused 1200 Tramadol 50 mg. Would not turn over to take med, wasted crushed medication down sink in patient room. Continue with plan of care.

## 2015-01-17 ENCOUNTER — Inpatient Hospital Stay (HOSPITAL_COMMUNITY): Payer: Medicaid Other | Admitting: Speech Pathology

## 2015-01-17 ENCOUNTER — Encounter (HOSPITAL_COMMUNITY): Payer: Self-pay | Admitting: Occupational Therapy

## 2015-01-17 ENCOUNTER — Inpatient Hospital Stay (HOSPITAL_COMMUNITY): Payer: Medicaid Other | Admitting: Physical Therapy

## 2015-01-17 ENCOUNTER — Inpatient Hospital Stay (HOSPITAL_COMMUNITY): Payer: Self-pay | Admitting: Occupational Therapy

## 2015-01-17 MED ORDER — ACETAMINOPHEN 160 MG/5ML PO SOLN
650.0000 mg | ORAL | Status: DC | PRN
  Administered 2015-01-28 – 2015-01-31 (×3): 650 mg via ORAL
  Filled 2015-01-17 (×9): qty 20.3

## 2015-01-17 MED ORDER — VALPROIC ACID 250 MG/5ML PO SYRP
250.0000 mg | ORAL_SOLUTION | Freq: Two times a day (BID) | ORAL | Status: DC
  Administered 2015-01-18: 250 mg via ORAL
  Filled 2015-01-17 (×4): qty 5

## 2015-01-17 NOTE — Progress Notes (Signed)
Occupational Therapy Session Note  Patient Details  Name: Leah Rojas MRN: [id number] Date of Birth: 02/15/1991  Today's Date: 01/17/2015 OT Individual Time: 0800-0900 and [id number] OT Individual Time Calculation (min): 60 min and 25 min  and Today's Date: 01/17/2015 OT Missed Time: 20 Minutes Missed Time Reason: Patient fatigue;Patient unwilling/refused to participate without medical reason   Short Term Goals: Week 1:  OT Short Term Goal 1 (Week 1): Pt will initiate 1 self care task with max cues OT Short Term Goal 2 (Week 1): Pt will complete bathing with mod assist and max cues for sequencing OT Short Term Goal 3 (Week 1): Pt will stand for 30 seconds during self-care task with min assist OT Short Term Goal 4 (Week 1): Pt will sustain attention to functional task for 30 seconds with max cues OT Short Term Goal 5 (Week 1): Pt will follow one-step commands during self-care task 75% of time with max cues  Skilled Therapeutic Interventions/Progress Updates:  Session 1: Upon entering the room, pt supine in enclosure bed with c/o L shoulder pain. RN notified and medication given during session for pain. Pt requiring coaxing for participation as pt states, "It is too early for me" but reluctantly agrees to OT intervention. Pt performed supine >sit with supervision and min A transfer into wheelchair. Min A stand pivot transfer on toilet with successful void. Pt required min A balance during clothing management and hygiene this session. Pt ambulated with Mod A to sit onto shower chair for bathing. Pt refusing to wash hair which appears to have dried blood in it. Pt very impulsive when leaving shower and not fully dried before attempting to ambulate to recliner chair. Pt sits in chair with rest break secondary to fatigue. She finished drying self and then therapist handing pt clothing items to dress at chair. STS with min A for LB clothing management. Pt seated and eating small amount of breakfast  food with min verbal cues for safe eating strategies. Pt requesting to returning to enclosure bed. Cal bell and suction within closed bed upon exiting the room.  Session 2: Upon entering the room, pt in enclosure bed with no c/o pain this session. Pt reporting she has not had lunch yet and is requesting food. Therapist encouraged pt to suction self as she was difficult to understand based on large amount of secretions coming from mouth. She declined toileting this session. Pt refusing to leave bed but sat on edge of bed with tray table placed in front of her to eat and drink. Pt reporting how she did not like the diet she was on with therapist educating her on importance of this current died in order for her to be safe. Pt required verbal cues to slow down and take smaller bites but she did eat applesauce and consumed thickened juice. Pt engaged in functional conversation regarding her daughter and activities that she enjoys. Pt then began to get emotional with tears and stating, "No one understands but me." Pt then laying down and reporting she was done with therapy for the day. Therapist attempted to coax pt into participation but she refused. 20 minutes of skilled OT intervention missed this session. Enclosure bed sealed with call bell and suction remaining within reach.   Therapy Documentation Precautions:  Precautions Precautions: Fall Restrictions Weight Bearing Restrictions: No General: General OT Amount of Missed Time: 20 Minutes PT Missed Treatment Reason: Patient fatigue;Patient unwilling to participate Vital Signs: Therapy Vitals Pulse Rate: (!) 110 BP: 128/83 mmHg  Patient Position (if appropriate): Lying Oxygen Therapy SpO2: 97 %  See FIM for current functional status  Therapy/Group: Individual Therapy  Lowella Grip 01/17/2015, 1:26 PM

## 2015-01-17 NOTE — Progress Notes (Signed)
Physical Therapy Session Note  Patient Details  Name: Gillian ShieldsShanese Xxxclark MRN: [id number] Date of Birth: 01/31/1991  Today's Date: 01/17/2015 PT Individual Time: 1100-1150 PT Individual Time Calculation (min): 50 min   Short Term Goals: Week 1:  PT Short Term Goal 1 (Week 1): Patient will sustain attention to functional task x 60 sec with max multimodal A.  PT Short Term Goal 2 (Week 1): Patient will initiate functional mobility tasks with mod multimodal A 50% of time.  PT Short Term Goal 3 (Week 1): Patient will perform dynamic standing balance x 3 min with max multimodal A.  PT Short Term Goal 4 (Week 1): Patient will perform bed mobility with min A 50% of time.  PT Short Term Goal 5 (Week 1): Patient will ambulate 150 ft with consistent mod A x 1.   Skilled Therapeutic Interventions/Progress Updates:  Pt was seen bedside in the am in an enclosure bed. Pt was sleeping requiring max multimodal cues to arouse. Donned socks and B shoes. Pt transferred supine to edge of bed with S and increased time with multiple cues to encourage. Pt transferred sit to stand with mod A and verbal cues. Pt ambulated about 20 feet with hand hold and mod A with verbal cues. Pt abruptly sitting in recliner chair. Pt transferred recliner chair to w/c with min A and verbal cues. Pt propelled w/c wit B LEs to rehab gym about 100 feet with S. Pt ambulated about 80 feet with hand hold and mod A, pt stating she was getting hot and needed to rest. Pt sat on edge of mat. Pt quickly transferred from edge of mat to supine with S. BP in supine 128/83, HR 110, and O2 sat 97%. Pt has decreased attention span requiring frequent change in tasks to remain interested. Pt returned to edge of mat with S. Pt requested to returned to room. Pt redirected. Pt willing to attempt Nu step. Pt transferred mat to Nu step with min to mod A and verbal cues. Pt utilized Nu-step for 2 minutes at level 2. As number of patients increased in gym. Pt requested  to return to room. Pt stated she didn't like all the noise. Pt transferred Nu-step to w/c. Pt propelledw/c back to room with S and B LEs. In room attempted cone tapping activity. Pt briefly participated then shut down requesting to returned to bed. Pt unwilling to continue with therapy. Pt transferred w/c to edge of bed with min to mod A. Pt transferred edge of bed to supine with S. Attempted to encourage continued participation with therapy. Pt continued to refuse despite encouragement. Pt left in enclosure bed at end of treatment session.   PAIN:  No c/o pain.    Locomotion : Ambulation Ambulation/Gait Assistance: 3: Mod assist   See FIM for current functional status  Therapy/Group: Individual Therapy  Rayford HalstedMitchell, Treshon Stannard G 01/17/2015, 1:00 PM

## 2015-01-17 NOTE — Progress Notes (Signed)
Speech Language Pathology Daily Session Note  Patient Details  Name: Leah Rojas MRN: [id number] Date of Birth: 07/16/1991  Today's Date: 01/17/2015 SLP Individual Time: 1500-1530 SLP Individual Time Calculation (min): 30 min  Short Term Goals: Week 1: SLP Short Term Goal 1 (Week 1): Patient will focus attention to functional tasks for ~60 seconds with Max A multimodal cues.  SLP Short Term Goal 2 (Week 1): Patient will identify 1 physical and 1 cognitive deficit with Max  A multimodal cues.  SLP Short Term Goal 3 (Week 1): Patient will intiate functional tasks with Max A multimodal cues.  SLP Short Term Goal 4 (Week 1): Patient will recall new, daily information with use of external aids with Max A multimodal cues.  SLP Short Term Goal 5 (Week 1): Patient will self-monitor and correct oral holding and anterior spillage of saliva with Max A multimodal cues.  SLP Short Term Goal 6 (Week 1): Patient will consume current diet with minimal overt s/s of aspiration with Max A multimodal cues for use of swallow strategies.   Skilled Therapeutic Interventions: Skilled ST intervention provided with focus on dysphagia goals.  Pt seen in room for ST tx session with family/visitors present. Family stated that pt refused to eat pureed foods for lunch and was asking for "regular" food and drink. Pt uncooperative at first, requiring max encouragement from clinician and family to participate in ST tx. Pt noted lying in bed and only agreed to sit up momentarily to accept PO, then laid back down. Slp used gentle approach to provided pt and family education of recommended posture for PO intake as upright 90 degrees. Pt consumed small bites of puree and HTL with no overt s/s of aspiration. Verbal cues required to ensure use of compensatory swallow strategies. Anterior loss of saliva noted x2, pt aware of spillage. NTL via tsp trailed with no overt s/s of aspiration, vocal quality remained clear. Family requesting  re-assessment of swallow fxn asap to assess potential for diet upgrade.    FIM:  Comprehension Comprehension Mode: Auditory Comprehension: 4-Understands basic 75 - 89% of the time/requires cueing 10 - 24% of the time Expression Expression Mode: Verbal Expression: 2-Expresses basic 25 - 49% of the time/requires cueing 50 - 75% of the time. Uses single words/gestures. Social Interaction Social Interaction: 1-Interacts appropriately less than 25% of the time. May be withdrawn or combative. Problem Solving Problem Solving: 1-Solves basic less than 25% of the time - needs direction nearly all the time or does not effectively solve problems and may need a restraint for safety Memory Memory: 2-Recognizes or recalls 25 - 49% of the time/requires cueing 51 - 75% of the time FIM - Eating Eating Activity: 5: Supervision/cues  Pain Pain Assessment Pain Assessment: No/denies pain  Therapy/Group: Individual Therapy  Reeshemah Nazaryan, Kara PacerLeah N 01/17/2015, 4:30 PM
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
## 2015-01-18 ENCOUNTER — Inpatient Hospital Stay (HOSPITAL_COMMUNITY): Payer: Medicaid Other | Admitting: Physical Therapy

## 2015-01-18 MED ORDER — PANTOPRAZOLE SODIUM 40 MG PO PACK
40.0000 mg | PACK | Freq: Every day | ORAL | Status: DC
  Administered 2015-01-24 – 2015-01-31 (×6): 40 mg via ORAL
  Filled 2015-01-18 (×23): qty 20

## 2015-01-18 MED ORDER — DIVALPROEX SODIUM 125 MG PO CSDR
250.0000 mg | DELAYED_RELEASE_CAPSULE | Freq: Two times a day (BID) | ORAL | Status: DC
  Administered 2015-01-18 – 2015-01-19 (×2): 250 mg via ORAL
  Filled 2015-01-18 (×4): qty 2

## 2015-01-18 NOTE — Progress Notes (Signed)
Occupational Therapy Session Note  Patient Details  Name: Leah Rojas MRN: [id number] Date of Birth: 11/08/1990  Today's Date: 01/18/2015 OT Individual Time:  -  0940--0950  (10min)  Make up minutes       Short Term Goals: Week 1:  OT Short Term Goal 1 (Week 1): Pt will initiate 1 self care task with max cues OT Short Term Goal 2 (Week 1): Pt will complete bathing with mod assist and max cues for sequencing OT Short Term Goal 3 (Week 1): Pt will stand for 30 seconds during self-care task with min assist OT Short Term Goal 4 (Week 1): Pt will sustain attention to functional task for 30 seconds with max cues OT Short Term Goal 5 (Week 1): Pt will follow one-step commands during self-care task 75% of time with max cues  Skilled Therapeutic Interventions/Progress Updates:    Pt.lying in vail bed upon OT arrival.  She was difficult to arouse. OT explained Purpose of treatment.  Pt says in matter of fact voice that she does not want to do any therapy.  She asks therapist to leave and states she will see me later.      Therapy Documentation Precautions:  Precautions Precautions: Fall Restrictions Weight Bearing Restrictions: No General:    Pain:  None noted           See FIM for current functional status  Therapy/Group: Individual Therapy  Humberto Sealsdwards, Mehkai Gallo J 01/18/2015, 7:59 AM

## 2015-01-18 NOTE — Progress Notes (Signed)
Physical Therapy Session Note  Patient Details  Name: Leah Rojas MRN: [id number] Date of Birth: 11/19/1990  Today's Date: 01/18/2015 PT Individual Time: 0800-0830 PT Individual Time Calculation (min): 30 min   Short Term Goals: Week 1:  PT Short Term Goal 1 (Week 1): Patient will sustain attention to functional task x 60 sec with max multimodal A.  PT Short Term Goal 2 (Week 1): Patient will initiate functional mobility tasks with mod multimodal A 50% of time.  PT Short Term Goal 3 (Week 1): Patient will perform dynamic standing balance x 3 min with max multimodal A.  PT Short Term Goal 4 (Week 1): Patient will perform bed mobility with min A 50% of time.  PT Short Term Goal 5 (Week 1): Patient will ambulate 150 ft with consistent mod A x 1.   Skilled Therapeutic Interventions/Progress Updates:  Pt was seen bedside in am. Pt in enclosure bed. Pt sleeping upon entering room. Multimodal cues required to arouse pt. Pt stating " I am tired, leave alone, I am not doing it". Attempted to redirect pt and encourage participation with therapy. Pt's nurse tech at bedside, pt agreed to get OOB to eat breakfast. Pt transferred supine to edge of bed with S and increased time. Pt transferred sit to stand with min guard, pt stating "don't touch me I can do it myself". Pt ambulated short distances to recliner with min guard to min A and abruptly sat down.  Pt drank to orange juices and an apple sauce. While eating pt educated on benefits of therapy and need to particpate. Pt continued to refused unable to redirect, pt stating I am getting back in bed. Pt returned to edge of bed with min guard to min A. Pt agreed to return to recliner to allow sheets to be changed on bed. Pt returned to recliner with min guard to min A. Once sheets on bed. Pt abruptly got up with min guard to min A and returned to edge of bed. Pt transferred edge of bed to supine with S. Pt turned self away from therapist and unwilling to  participate any further at this time. Pt left in enclosure bed with all needs within reach.   Therapy Documentation Precautions:  Precautions Precautions: Fall Restrictions Weight Bearing Restrictions: No General: PT Amount of Missed Time (min): 15 Minutes PT Missed Treatment Reason: Patient unwilling to participate;Patient fatigue Pain: No c/o pain.    Locomotion : Ambulation Ambulation/Gait Assistance: 4: Min assist   See FIM for current functional status  Therapy/Group: Individual Therapy  Rayford HalstedMitchell, Kilah Drahos G 01/18/2015, 8:33 AM

## 2015-01-18 NOTE — Progress Notes (Signed)
Rested quietly during night. Used call light to request to go to bathroom. Ate one bite of ultram and trazodone, mixed in puree, refused depakote and AM ultram. Continues to complain of bilateral shoulder pain, Left>right. Heat packs applied per patient's request. Continues to require enclosure bed for decreased safety awareness. Leah MartinezMurray, Sultan Pargas A
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
## 2015-01-19 ENCOUNTER — Inpatient Hospital Stay (HOSPITAL_COMMUNITY): Payer: Medicaid Other

## 2015-01-19 ENCOUNTER — Inpatient Hospital Stay (HOSPITAL_COMMUNITY): Payer: Medicaid Other | Admitting: Physical Therapy

## 2015-01-19 ENCOUNTER — Inpatient Hospital Stay (HOSPITAL_COMMUNITY): Payer: Self-pay

## 2015-01-19 ENCOUNTER — Inpatient Hospital Stay (HOSPITAL_COMMUNITY): Payer: Medicaid Other | Admitting: Speech Pathology

## 2015-01-19 LAB — BASIC METABOLIC PANEL
Anion gap: 13 (ref 5–15)
BUN: 18 mg/dL (ref 6–23)
CO2: 25 mmol/L (ref 19–32)
Calcium: 9.5 mg/dL (ref 8.4–10.5)
Chloride: 99 mmol/L (ref 96–112)
Creatinine, Ser: 0.65 mg/dL (ref 0.50–1.10)
GFR calc Af Amer: >90 mL/min (ref >90)
Glucose, Bld: 117 mg/dL — ABNORMAL HIGH (ref 70–99)
Potassium: 3.8 mmol/L (ref 3.5–5.1)
Sodium: 137 mmol/L (ref 135–145)

## 2015-01-19 MED ORDER — DIVALPROEX SODIUM 125 MG PO CSDR
500.0000 mg | DELAYED_RELEASE_CAPSULE | Freq: Two times a day (BID) | ORAL | Status: DC
  Administered 2015-01-19 – 2015-02-13 (×42): 500 mg via ORAL
  Filled 2015-01-19 (×56): qty 4

## 2015-01-19 MED ORDER — METHYLPHENIDATE HCL 5 MG PO TABS
5.0000 mg | ORAL_TABLET | Freq: Two times a day (BID) | ORAL | Status: DC
  Administered 2015-01-21 – 2015-01-25 (×6): 5 mg via ORAL
  Filled 2015-01-19 (×15): qty 1

## 2015-01-19 NOTE — Progress Notes (Signed)
More cooperative at bedtime, took scheduled depakote and trazodone.  "Thank you." Drank 3 containers of honey thick apple juice. Ambulated to bathroom with hands on steadying assistance. Continent of bladder. Unsure of last BM. Complaining of left shoulder pain, winced when putting gown on. Would not wake to take scheduled midnight ultram. Alfredo MartinezMurray, Joelyn Lover A

## 2015-01-19 NOTE — Progress Notes (Signed)
Speech Language Pathology Daily Session Note  Patient Details  Name: Leah Rojas MRN: [id number] Date of Birth: 1991/08/07  Today's Date: 01/19/2015 SLP Individual Time: 1130-1200, 1300-1330 SLP Individual Time Calculation (min): 30 min(am), 30 min (pm)  Short Term Goals: Week 1: SLP Short Term Goal 1 (Week 1): Patient will focus attention to functional tasks for ~60 seconds with Max A multimodal cues.  SLP Short Term Goal 1 - Progress (Week 1): Progressing toward goal SLP Short Term Goal 2 (Week 1): Patient will identify 1 physical and 1 cognitive deficit with Max  A multimodal cues.  SLP Short Term Goal 2 - Progress (Week 1): Progressing toward goal SLP Short Term Goal 3 (Week 1): Patient will intiate functional tasks with Max A multimodal cues.  SLP Short Term Goal 3 - Progress (Week 1): Progressing toward goal SLP Short Term Goal 4 (Week 1): Patient will recall new, daily information with use of external aids with Max A multimodal cues.  SLP Short Term Goal 4 - Progress (Week 1): Progressing toward goal SLP Short Term Goal 5 (Week 1): Patient will self-monitor and correct oral holding and anterior spillage of saliva with Max A multimodal cues.  SLP Short Term Goal 5 - Progress (Week 1): Progressing toward goal SLP Short Term Goal 6 (Week 1): Patient will consume current diet with minimal overt s/s of aspiration with Max A multimodal cues for use of swallow strategies.  SLP Short Term Goal 6 - Progress (Week 1): Progressing toward goal  Skilled Therapeutic Interventions: Skilled ST intervention provided with focus on cognitive goals.  Pt seen in room for ST tx session. Aunt present initially, and reiterated family report that pt refuses to eat pureed foods. Aunt asked if she could bring in soup that pt liked prior to admit. Will address dysphagia goals during afternoon session. Pt sleeping, requiring max encouragement from clinician to participate in ST tx. Pt remained in side lying  position throughout session, and did not agree to sit up at all during morning session. Pt with minimal participation during session.  She answered few personal questions (her name, birthday, daughter's name), but indicated she wasn't "going to do anything else", rolled over and refused to participate further.    Pt's aunt brought broccoli/cheddar soup from [employer] for afternoon therapy session. Aunt was very beneficial in encouraging pt to participate in therapy. Pt initiated functional task of going to the bathroom, but exhibited impulsivity and frustration with physical assistance by SLP/aunt. After toileting task, pt cleaned hands with sanitizer, and returned to EOB, and agreed to eat some of the soup. Verbal cues were required for small bolus size and decreased rate. Aunt provided appropriate cues to swallow before taking another bite. Soup was noted to have small pieces of broccoli, which pt was noted to chew prior to swallowing, no cues required. Pt sustained attention for a brief period of time, then wanted to transfer into her recliner. Mod cues required to minimize impulsivity. Pt returned to bed after 1-2 more bites of soup. RN provided meds crushed in honey thick liquids, which pt tolerated without overt difficulty. Pt consumed approximately half of the soup, and more than half of her orange juice. No overt s/s aspiration observed with any lunch item. SLP asked pt if there was something she would like to have to eat, and pt answered "chicken nuggets". Aunt will bring in some for next session. Full supervision continues to be recommended during po intake.   FIM:  Comprehension Comprehension Mode:  Auditory Comprehension: 4-Understands basic 75 - 89% of the time/requires cueing 10 - 24% of the time Expression Expression Mode: Verbal Expression: 3-Expresses basic 50 - 74% of the time/requires cueing 25 - 50% of the time. Needs to repeat parts of sentences. Social Interaction Social Interaction:  1-Interacts appropriately less than 25% of the time. May be withdrawn or combative. Problem Solving Problem Solving: 2-Solves basic 25 - 49% of the time - needs direction more than half the time to initiate, plan or complete simple activities Memory Memory: 2-Recognizes or recalls 25 - 49% of the time/requires cueing 51 - 75% of the time FIM - Eating Eating Activity: 5: Supervision/cues  Pain Pain Assessment Pain Assessment: No/denies pain Pain Score: Asleep  Therapy/Group: Individual Therapy   Lanecia Sliva B. LonokeBueche, [facility] Health Insitute HospitalMSP, CCC-SLP [phone]  Leah Rojas, Leah Rojas 01/19/2015, 2:32 PM

## 2015-01-19 NOTE — Progress Notes (Signed)
Occupational Therapy Session Note  Patient Details  Name: Leah Rojas MRN: [id number] Date of Birth: 12/04/1990  Today's Date: 01/19/2015 OT Individual Time: [id number] and [id number] and [id number] OT Individual Time Calculation (min): 54 min and 10 min and 12 min    Short Term Goals: Week 1:  OT Short Term Goal 1 (Week 1): Pt will initiate 1 self care task with max cues OT Short Term Goal 2 (Week 1): Pt will complete bathing with mod assist and max cues for sequencing OT Short Term Goal 3 (Week 1): Pt will stand for 30 seconds during self-care task with min assist OT Short Term Goal 4 (Week 1): Pt will sustain attention to functional task for 30 seconds with max cues OT Short Term Goal 5 (Week 1): Pt will follow one-step commands during self-care task 75% of time with max cues  Skilled Therapeutic Interventions/Progress Updates:    Session 1: Pt seen for ADL retraining with focus on functional transfers, alertness/arousal, sustained attention, sequencing, and task initiation. Pt received supine in bed agreeable to shower with min cues of encouragement. Pt verbalized 3 items required for shower without cues. Pt completed stand pivot transfer bed>w/c and w/c>shower chair with min A. Pt initiated bathing without cues and required min cues for sequencing of task. Pt sustained attention to bathing for 5 min with min cues. Pt transferred shower>w/c with min A then pt requested "break." Provided pt with break with minimal lighting and no external cues. Pt completed dressing with min A to initiate task. Pt followed one-step commands during bathing and dressing 75% of time. Pt requesting to return to bed, attempting to transfer unsafely without assist. Pt required long rest break while supine in bed with therapist providing verbal education to mother with focus on brain injury recovery and current goals of therapy. Pt's mother inquiring about bringing pt's daughter in this weekend for pt's birthday.  Educated on reasons why this is not recommended at this time, however mother continuing to ask. Informed mother team would see how pt progresses this week and discuss with therapy team at end of the week. Following rest break, pt sat EOB to complete oral care via suction with min cues. Pt returned to bed and left with all needs in reach.   Session 2: Pt seen for 1:1 OT session (make-up from weekend and AM session) with focus on alertness, command following, and cognitive remediation. Pt received supine in bed agreeable to sit EOB with mod cues of encouragement. Pt sat EOB and requested socks. Donned with setup assist however declined shoes and returned to supine in bed. Provided max cues of encouragement however pt continued to decline. Pt left supine with all needs in reach.   Session 3: Pt seen for 1:1 OT session with focus on sustained attention, overall participation/alertness, and activity tolerance. Pt received supine in bed requiring max multimodal cues for sitting EOB. Pt engaged in brief conversation in regards to pt's hobbies. Provided pt with magazine that related to pt's interest in fashion, however, pt throwing magazine across room. Pt immediately transitioned back to bed and attempted to zip enclosure bed. Pt raising voice at therapist and asking her to leave. Pt left supine in bed with no external stimuli (lights off, blinds closed, etc) to allow for de-escalation.   Therapy Documentation Precautions:  Precautions Precautions: Fall Restrictions Weight Bearing Restrictions: No General: General OT Amount of Missed Time: 6 Minutes Vital Signs: Therapy Vitals Temp: 98.6 F (37 C) Temp Source: Oral Pulse Rate:  93 Resp: 18 BP: 124/77 mmHg Patient Position (if appropriate): Lying Oxygen Therapy SpO2: 100 % O2 Device: Not Delivered Pain: Pain in L shoulder  See FIM for current functional status  Therapy/Group: Individual Therapy  Daneil Dan 01/19/2015, 8:56 AM

## 2015-01-19 NOTE — Progress Notes (Signed)
Physical Therapy Session Note  Patient Details  Name: Leah Rojas MRN: [id number] Date of Birth: 01/14/1991  Today's Date: 01/19/2015 PT Individual Time: 1000-1045 PT Individual Time Calculation (min): 45 min   Short Term Goals: Week 1:  PT Short Term Goal 1 (Week 1): Patient will sustain attention to functional task x 60 sec with max multimodal A.  PT Short Term Goal 2 (Week 1): Patient will initiate functional mobility tasks with mod multimodal A 50% of time.  PT Short Term Goal 3 (Week 1): Patient will perform dynamic standing balance x 3 min with max multimodal A.  PT Short Term Goal 4 (Week 1): Patient will perform bed mobility with min A 50% of time.  PT Short Term Goal 5 (Week 1): Patient will ambulate 150 ft with consistent mod A x 1.   Skilled Therapeutic Interventions/Progress Updates:   Session focused on alertness, initiation, activity tolerance, and active engagement in therapeutic tasks. Patient received asleep in enclosure bed, aunt and family friend present. Patient continuously refused to sit edge of bed despite max coaxing and multimodal cues for participation, initially telling therapist to come back in 10-20 min but then stating, "Leave me alone," "Get away from me," and "Don't touch me." Attempted engaging in therapeutic conversation with aunt and friend to increase participation with limited success. Patient with increased frustration noted and stood impulsively from edge of bed multiple times with personal socks on after refusing non-slip socks or shoes, requiring +2 assist for safety due to feet slipping. Eventually patient transferred to bedside chair with min-mod A and donned shoes with more than reasonable amount of time and refused any help from staff or family. With max encouragement, patient ambulated from room > day room with mod A x 2 with this therapist and aunt assisting patient. Patient provided with single use hot packs due to previous c/o B shoulder pain  (although patient with no signs/symptoms of pain with light touch to shoulders noted this date). Patient sat in chair in day room, keeping head down on table and refused to activate hot packs. Again attempted therapeutic conversation with focus on family and birthday plans with patient refusing to engage or answer any questions. Patient with decreased frustration tolerance and ambulated back to room with +2 assist for safety although she stated she did not need any help and attempted to get away from therapist. Patient directed to recliner, where she removed socks and shoes and attempted to unzip enclosure bed with therapist providing min A for balance. Patient unable to unzip bed fully and returned to recliner. Patient refused toileting as well as refused breakfast meal. Bed unzipped and patient demonstrated verbal/physical agitation as she pushed therapist out of way to return to bed. Patient left in enclosure bed with lights off, blinds drawn, and door cracked to aide in de-escalation with call bell and phone within reach.  Therapy Documentation Precautions:  Precautions Precautions: Fall Restrictions Weight Bearing Restrictions: No General: PT Amount of Missed Time (min): 15 Minutes PT Missed Treatment Reason: Increased agitation;Patient unwilling to participate Pain: Pain Assessment Pain Assessment: No/denies pain Locomotion : Ambulation Ambulation/Gait Assistance: 3: Mod assist;1: +2 Total assist   See FIM for current functional status  Therapy/Group: Individual Therapy  Kerney ElbeVarner, Avriel Kandel A 01/19/2015, 11:05 AM

## 2015-01-19 NOTE — Plan of Care (Signed)
Problem: RH PAIN MANAGEMENT Goal: RH STG PAIN MANAGED AT OR BELOW PT'S PAIN GOAL <3 on a 0-10 scale  Outcome: Not Progressing Pain consistently > 3

## 2015-01-19 NOTE — Progress Notes (Signed)
Physical Therapy Session Note  Patient Details  Name: Leah ShieldsShanese Xxxclark MRN: [id number] Date of Birth: 06/10/1991  Today's Date: 01/19/2015 PT Individual Time: 1415-1430 PT Individual Time Calculation (min): 15 min   Short Term Goals: Week 1:  PT Short Term Goal 1 (Week 1): Patient will sustain attention to functional task x 60 sec with max multimodal A.  PT Short Term Goal 2 (Week 1): Patient will initiate functional mobility tasks with mod multimodal A 50% of time.  PT Short Term Goal 3 (Week 1): Patient will perform dynamic standing balance x 3 min with max multimodal A.  PT Short Term Goal 4 (Week 1): Patient will perform bed mobility with min A 50% of time.  PT Short Term Goal 5 (Week 1): Patient will ambulate 150 ft with consistent mod A x 1.   Skilled Therapeutic Interventions/Progress Updates:   Session focused on alertness, command following, and active participation in therapeutic tasks. Patient received asleep in enclosure bed with NT present to assess vitals. Patient refused to sit edge of bed with max coaxing by therapist and NT to take BP. Patient pressed call bell and would not vocalize to therapist or NT what she needed. When RN arrived, patient asked RN to tell therapist and NT to "leave me alone." Vitals taken in prone due to patient refusal to participate. Patient remained in prone position in enclosure bed and would not engage with any staff member present further, call bell and phone within reach.    Therapy Documentation Precautions:  Precautions Precautions: Fall Restrictions Weight Bearing Restrictions: No General: PT Amount of Missed Time (min): 15 Minutes PT Missed Treatment Reason: Patient unwilling to participate Vital Signs: Therapy Vitals Temp: 98.2 F (36.8 C) Temp Source: Axillary Pulse Rate: (!) 104 Resp: 16 BP: 115/74 mmHg Patient Position (if appropriate): Sitting Oxygen Therapy SpO2: 100 % O2 Device: Not Delivered Pain: Pain Assessment Pain  Assessment: No/denies pain  See FIM for current functional status  Therapy/Group: Individual Therapy  Kerney ElbeVarner, Matteus Mcnelly A 01/19/2015, 2:59 PM

## 2015-01-19 NOTE — Plan of Care (Signed)
Problem: RH SKIN INTEGRITY Goal: RH STG SKIN FREE OF INFECTION/BREAKDOWN Remain free from infection/breakdown with total assistance while on rehab  Outcome: Progressing No skin breakdwon

## 2015-01-19 NOTE — Plan of Care (Signed)
Problem: RH BLADDER ELIMINATION Goal: RH STG MANAGE BLADDER WITH ASSISTANCE STG Manage Bladder With min Assistance  Outcome: Progressing No incontinent episode reported

## 2015-01-19 NOTE — Progress Notes (Signed)
12:14 PM Attempted lower extremity venous duplex. Patient is currently refusing. RN will call us to see when will be a good time to try again tomorrow (most likely after patient bath time).  Farrel DemarkJill Eunice, RDMS, RVT 01/19/2015
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
## 2015-01-19 NOTE — Plan of Care (Signed)
Problem: RH BOWEL ELIMINATION Goal: RH STG MANAGE BOWEL W/MEDICATION W/ASSISTANCE STG Manage Bowel with Medication with min Assistance.  Outcome: Not Progressing No BM since admit.

## 2015-01-19 NOTE — Plan of Care (Signed)
Problem: RH BOWEL ELIMINATION Goal: RH STG MANAGE BOWEL WITH ASSISTANCE STG Manage Bowel with Min Assistance.  Outcome: Not Progressing No bowel movement at this time

## 2015-01-20 ENCOUNTER — Inpatient Hospital Stay (HOSPITAL_COMMUNITY): Payer: Medicaid Other | Admitting: Speech Pathology

## 2015-01-20 ENCOUNTER — Inpatient Hospital Stay (HOSPITAL_COMMUNITY): Payer: Medicaid Other

## 2015-01-20 ENCOUNTER — Inpatient Hospital Stay (HOSPITAL_COMMUNITY): Payer: Medicaid Other | Admitting: Physical Therapy

## 2015-01-20 DIAGNOSIS — M7989 Other specified soft tissue disorders: Secondary | ICD-10-CM

## 2015-01-20 MED ORDER — QUETIAPINE FUMARATE 50 MG PO TABS
50.0000 mg | ORAL_TABLET | Freq: Every day | ORAL | Status: DC
  Administered 2015-01-20 – 2015-01-22 (×3): 50 mg via ORAL
  Filled 2015-01-20 (×4): qty 1

## 2015-01-20 NOTE — Progress Notes (Signed)
Physical Therapy Session Note  Patient Details  Name: Leah Rojas MRN: [id number] Date of Birth: 12/04/1990  Today's Date: 01/20/2015 PT Individual Time: 1100-1135 PT Individual Time Calculation (min): 35 min   Short Term Goals: Week 1:  PT Short Term Goal 1 (Week 1): Patient will sustain attention to functional task x 60 sec with max multimodal A.  PT Short Term Goal 2 (Week 1): Patient will initiate functional mobility tasks with mod multimodal A 50% of time.  PT Short Term Goal 3 (Week 1): Patient will perform dynamic standing balance x 3 min with max multimodal A.  PT Short Term Goal 4 (Week 1): Patient will perform bed mobility with min A 50% of time.  PT Short Term Goal 5 (Week 1): Patient will ambulate 150 ft with consistent mod A x 1.   Skilled Therapeutic Interventions/Progress Updates:   Session focused on overall participation, tolerance to therapist's presence, and family education. Pt received supine in bed, declining therapy at this time and only verbally responding to therapist out of frustration after therapist utilizing light touch to facilitate alertness/engagement with patient. Attempted to engage in therapeutic conversation as well as functional task of putting on pants following report that her mom was on her way. When therapist sat on patient's bed, patient pushed therapist out of bed. Patient repeatedly telling therapist to leave and used call bell to ask nurse secretary to remove therapist from room. Patient appeared to better tolerate presence of nurse secretary, therefore therapist stepped out of room and nurse secretary assisted patient with donning pants and socks while in bed. Patient refused further participation with therapy, left prone in bed with blinds open and lights on to assist with facilitating sleep/wake schedule as patient's mother reports she has not been sleeping at night. Patient left in enclosure bed with call bell and phone within reach.   Therapy  Documentation Precautions:  Precautions Precautions: Fall Restrictions Weight Bearing Restrictions: No General: PT Amount of Missed Time (min): 25 Minutes PT Missed Treatment Reason: Patient unwilling to participate Pain: Pain Assessment Pain Assessment: Faces Faces Pain Scale: No hurt  See FIM for current functional status  Therapy/Group: Individual Therapy  Kerney ElbeVarner, Aldina Porta A 01/20/2015, 12:12 PM

## 2015-01-20 NOTE — Progress Notes (Signed)
Nursing Note: Pt  Has not voided this shift.Pt scanned for 211cc.Pt declined to go to the bathroom.Pt wanted to wait till later.Refused am meds.Poor po intake.wbb

## 2015-01-20 NOTE — Progress Notes (Signed)
VASCULAR LAB PRELIMINARY  PRELIMINARY  PRELIMINARY  PRELIMINARY  BLEV completed.    Preliminary report:  Limited study due to patient's lack of cooperation.  Negative DVT bilateral lower extremities.  Negative Baker's Cyst(s) bilaterally.  Loralie ChampagneBishop, Parrish Bonn F, RVT 01/20/2015, 2:58 PM

## 2015-01-20 NOTE — Progress Notes (Signed)
Occupational Therapy Session Note  Patient Details  Name: Merikay Lesniewski MRN: [id number] Date of Birth: [birth year]  Today's Date: 01/20/2015 OT Individual Time: 4315-4008 and 6761-9509 and 1530-1602 OT Individual Time Calculation (min): 39 min and 25 min and 32 min Missed Time: 21 min (pt refusal) and 35 min (refusal)   Short Term Goals: Week 1:  OT Short Term Goal 1 (Week 1): Pt will initiate 1 self care task with max cues OT Short Term Goal 2 (Week 1): Pt will complete bathing with mod assist and max cues for sequencing OT Short Term Goal 3 (Week 1): Pt will stand for 30 seconds during self-care task with min assist OT Short Term Goal 4 (Week 1): Pt will sustain attention to functional task for 30 seconds with max cues OT Short Term Goal 5 (Week 1): Pt will follow one-step commands during self-care task 75% of time with max cues  Skilled Therapeutic Interventions/Progress Updates:    Session 1: Pt seen for ADL retraining with focus on functional transfers, safety awareness, task initiation, sequencing, and attention. Pt received supine in bed. Pt declining bathing this AM, asking to "dress only." Pt initiated need to urinate, therefore ambulated bed>toilet with min-mod HHA and pt demonstrating flexed posture. Pt with poor safety awareness as she was attempting to remove undergarments during ambulation. Pt ambulated to w/c then completed hand hygiene in sitting at sink with min cues. Completed dressing with min cues for sequencing and orientation of clothing. Pt noted to have increased ataxia this AM during self-care tasks. Pt resistant to therapist assisting with standing balance as she attempted to push therapist away multiple times. Upon completion of dressing pt stood and began ambulating back to bed, requiring mod A for balance. Pt transitioned to side lying with back towards therapist. Attempted to engage in therapeutic conversation with use of photos, however pt not verbally responding to  therapist and shaking hand at therapist to leave room. Therapist left room then returned. Returned to room after 10 min break for pt and pt initiated donning socks then transitioned to side lying facing therapist. Pt declined sitting EOB however identified 2 family members in pictures with max cues. Pt demonstrating behavioral tendencies at pt immediately identified both family members when pt informed she must complete task before therapist left. Pt left in enclosure bed with all needs.   Session 2: Pt seen for 1:1 OT session with focus on overall participation, initiation, and attention. Pt received side lying in bed. Pt opening eyes to verbal cue from therapist. Provided 2 options for therapy session and pt shaking head yes to therapist bringing laptop to room. Upon return, pt would not sit up and required max multimodal cues for eye contact and engaging in therapeutic conversation. With increased time and max cues, pt verbalized 1 activity she enjoyed stating "ATM." Therapist asking follow-up questions to inquire more and pt stating she liked to "spend money." Encouraged pt to use laptop to show therapist favorite store, however pt declining and turning back to therapist. Pt refusing further participation despite max encouragement. Pt left side lying in bed with lights on to facilitate sleep/wake cycle.   Session 3: Pt seen for 1:1 OT session with focus on attention, overall participation, tolerance of therapist in room, and family education. Pt received supine in bed declining getting OOB at this time. Attempted to engage in therapeutic conversation, however pt using call bell to ask NT to remove therapist from room. Pt threw blanket and pants onto floor then  asked therapist to retrieve. Pt frustrated that therapist would not retrieve blanket, requiring max cues and increased time to lean over EOB to retrieve blanket. Pt pulling blanket overhead and proceeded to ignore therapist. Mother, sister, and  brother entered room with pt acknowledging appropriately. Family encouraged pt to sit EOB, however pt refusing and began shaking her middle finger at family members. Provided rest break and therapist provided education on [place], goals of therapy and general TBI information. Provided pt with warm wash cloth and pt washed face then appropriately handed wash cloth back to therapist. Pt continued to decline further participation, therefore left in enclosure bed with all needs.   Therapy Documentation Precautions:  Precautions Precautions: Fall Restrictions Weight Bearing Restrictions: No General:   Vital Signs: Therapy Vitals Temp: 98 F (36.7 C) Temp Source: Oral Pulse Rate: 88 Resp: 17 BP: 104/66 mmHg Patient Position (if appropriate): Lying Oxygen Therapy SpO2: 100 % O2 Device: Not Delivered Pain: No pain reported during therapy session  See FIM for current functional status  Therapy/Group: Individual Therapy  [name] 01/20/2015, 8:34 AM
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
## 2015-01-20 NOTE — Progress Notes (Signed)
Speech Language Pathology Daily Session Note  Patient Details  Name: Leah ShieldsShanese Xxxclark MRN: [id number] Date of Birth: 09/13/1991  Today's Date: 01/20/2015 SLP Individual Time: 1000-1100 SLP Individual Time Calculation (min): 60 min  Short Term Goals: Week 1: SLP Short Term Goal 1 (Week 1): Patient will focus attention to functional tasks for ~60 seconds with Max A multimodal cues.  SLP Short Term Goal 1 - Progress (Week 1): Progressing toward goal SLP Short Term Goal 2 (Week 1): Patient will identify 1 physical and 1 cognitive deficit with Max  A multimodal cues.  SLP Short Term Goal 2 - Progress (Week 1): Progressing toward goal SLP Short Term Goal 3 (Week 1): Patient will intiate functional tasks with Max A multimodal cues.  SLP Short Term Goal 3 - Progress (Week 1): Progressing toward goal SLP Short Term Goal 4 (Week 1): Patient will recall new, daily information with use of external aids with Max A multimodal cues.  SLP Short Term Goal 4 - Progress (Week 1): Progressing toward goal SLP Short Term Goal 5 (Week 1): Patient will self-monitor and correct oral holding and anterior spillage of saliva with Max A multimodal cues.  SLP Short Term Goal 5 - Progress (Week 1): Progressing toward goal SLP Short Term Goal 6 (Week 1): Patient will consume current diet with minimal overt s/s of aspiration with Max A multimodal cues for use of swallow strategies.  SLP Short Term Goal 6 - Progress (Week 1): Progressing toward goal  Skilled Therapeutic Interventions: Skilled ST intervention provided with focus on cognitive and dysphagia goals. Education with mother also provided. Pt was seen in room. Visitor present, and provided significant encouragement to pt to participate in therapy. Pt verbalized need to go to the bathroom, and allowed SLP to assist her, giving mod-max cues for impulsivity. After, pt independently used hand sanitizer. Pt was unable to rub her hands together while walking, so SLP provided  mod-max cues to stop walking until hand sanitizer absorbed. Mod-ma cues continued to be necessary to assist pt safely back to bed. Mod verbal cues required for pt to remain still during ultrasound procedure, but otherwise pt was cooperative. Visitor encouraged pt to drink some honey thick Coke. No overt s/s aspiration observed. Pt continues to exhibit poor po intake, and refuses all but thickened apple juice.  Discussed pt swallow status and reiterated silent aspiration on 4/18 MBS. Will repeat MBS Wednesday April 27 to assess appropriateness to advance diet/liquids.    FIM:  Comprehension Comprehension Mode: Auditory Comprehension: 4-Understands basic 75 - 89% of the time/requires cueing 10 - 24% of the time Expression Expression Mode: Verbal Expression: 3-Expresses basic 50 - 74% of the time/requires cueing 25 - 50% of the time. Needs to repeat parts of sentences. Social Interaction Social Interaction: 1-Interacts appropriately less than 25% of the time. May be withdrawn or combative. Problem Solving Problem Solving: 2-Solves basic 25 - 49% of the time - needs direction more than half the time to initiate, plan or complete simple activities Memory Memory: 2-Recognizes or recalls 25 - 49% of the time/requires cueing 51 - 75% of the time FIM - Eating Eating Activity: 5: Supervision/cues  Pain Pain Assessment Pain Assessment: Faces Faces Pain Scale: No hurt  Therapy/Group: Individual Therapy   Baylor Teegarden B. Westwood ShoresBue[place], [facility] Surgery Center LLCMSP, CCC-SLP [phone]  Leigh AuroraBueche, Deane Melick Brown 01/20/2015, 12:43 PM

## 2015-01-21 ENCOUNTER — Inpatient Hospital Stay (HOSPITAL_COMMUNITY): Payer: Medicaid Other

## 2015-01-21 ENCOUNTER — Inpatient Hospital Stay (HOSPITAL_COMMUNITY): Payer: Medicaid Other | Admitting: Physical Therapy

## 2015-01-21 ENCOUNTER — Inpatient Hospital Stay (HOSPITAL_COMMUNITY): Payer: Self-pay

## 2015-01-21 ENCOUNTER — Inpatient Hospital Stay (HOSPITAL_COMMUNITY): Payer: Medicaid Other | Admitting: Speech Pathology
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
## 2015-01-21 NOTE — Progress Notes (Signed)
Physical Therapy Session Note  Patient Details  Name: Leah Rojas MRN: [id number] Date of Birth: 08/12/1991  Today's Date: 01/21/2015 PT Individual Time: 1000-1045 PT Individual Time Calculation (min): 45 min   Short Term Goals: Week 1:  PT Short Term Goal 1 (Week 1): Patient will sustain attention to functional task x 60 sec with max multimodal A.  PT Short Term Goal 2 (Week 1): Patient will initiate functional mobility tasks with mod multimodal A 50% of time.  PT Short Term Goal 3 (Week 1): Patient will perform dynamic standing balance x 3 min with max multimodal A.  PT Short Term Goal 4 (Week 1): Patient will perform bed mobility with min A 50% of time.  PT Short Term Goal 5 (Week 1): Patient will ambulate 150 ft with consistent mod A x 1.   Skilled Therapeutic Interventions/Progress Updates:    Pt received prone in Posey bed asleep, easily aroused however with difficulty following even one step commands. Pt initially resistant to getting dressed and getting out of bed and displayed frustration with this therapist. Therapist instead utilized RN and NS to assist pt with getting out of bed. This therapist directed treatment from out of pt's visual field to reduce distractions in environment. Pt propelled w/c 200' to ortho gym w/ MinA, then ambulated 150' back towards room w/ B HHA and w/c follow. When pt was directed to find her room, pt propelled w/c at very high speed directly into room and to Posey bed, then stood and transferred to bed impulsively without locking brakes. Pt refused further OOB therapy. Pt left in Posey bed with RN and NT present.  Therapy Documentation Precautions:  Precautions Precautions: Fall Restrictions Weight Bearing Restrictions: No Pain: Pain Assessment Pain Assessment: Faces Faces Pain Scale: No hurt  See FIM for current functional status  Therapy/Group: Individual Therapy  Hosie SpangleGodfrey, Tanner Yeley  Hosie SpangleJess Genavieve Mangiapane, PT, DPT 01/21/2015, 7:49 AM
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
## 2015-01-21 NOTE — Progress Notes (Signed)
Physical Therapy Session Note  Patient Details  Name: Leah ShieldsShanese Xxxclark MRN: [id number] Date of Birth: 12/27/1990  Today's Date: 01/21/2015 PT Individual Time: 1400-1410 PT Individual Time Calculation (min): 10 min   Short Term Goals: Week 1:  PT Short Term Goal 1 (Week 1): Patient will sustain attention to functional task x 60 sec with max multimodal A.  PT Short Term Goal 2 (Week 1): Patient will initiate functional mobility tasks with mod multimodal A 50% of time.  PT Short Term Goal 3 (Week 1): Patient will perform dynamic standing balance x 3 min with max multimodal A.  PT Short Term Goal 4 (Week 1): Patient will perform bed mobility with min A 50% of time.  PT Short Term Goal 5 (Week 1): Patient will ambulate 150 ft with consistent mod A x 1.   Skilled Therapeutic Interventions/Progress Updates:   Session focused on active participation and tolerance to therapist. Patient received asleep in enclosure bed, easily aroused with min multimodal cues. Patient instructed this therapist to leave her alone and stated she would not do anything. Patient instructed to answer 3 questions then therapist would depart. Patient answered 1/3 questions and refused to participate further. Patient threw pillows and wash cloth at therapist and held up middle finger. Patient left sidelying in enclosure bed with needs within reach.   Therapy Documentation Precautions:  Precautions Precautions: Fall Restrictions Weight Bearing Restrictions: No General: PT Amount of Missed Time (min): 20 Minutes PT Missed Treatment Reason: Patient unwilling to participate Pain: Pain Assessment Pain Assessment: No/denies pain  See FIM for current functional status  Therapy/Group: Individual Therapy  Kerney ElbeVarner, Kayhan Boardley A 01/21/2015, 2:15 PM

## 2015-01-21 NOTE — Progress Notes (Signed)
Nursing Note: In to give night meds, but pt refused.Will try again later.Pt resting quietly in bed.wbb

## 2015-01-21 NOTE — Progress Notes (Signed)
Speech Language Pathology Daily Session Note  Patient Details  Name: Leah Rojas MRN: [id number] Date of Birth: 01/28/1991  Today's Date: 01/21/2015 SLP Individual Time: 1200-1230 SLP Individual Time Calculation (min): 30 min  Short Term Goals: Week 1: SLP Short Term Goal 1 (Week 1): Patient will focus attention to functional tasks for ~60 seconds with Max A multimodal cues.  SLP Short Term Goal 1 - Progress (Week 1): Progressing toward goal SLP Short Term Goal 2 (Week 1): Patient will identify 1 physical and 1 cognitive deficit with Max  A multimodal cues.  SLP Short Term Goal 2 - Progress (Week 1): Progressing toward goal SLP Short Term Goal 3 (Week 1): Patient will intiate functional tasks with Max A multimodal cues.  SLP Short Term Goal 3 - Progress (Week 1): Progressing toward goal SLP Short Term Goal 4 (Week 1): Patient will recall new, daily information with use of external aids with Max A multimodal cues.  SLP Short Term Goal 4 - Progress (Week 1): Progressing toward goal SLP Short Term Goal 5 (Week 1): Patient will self-monitor and correct oral holding and anterior spillage of saliva with Max A multimodal cues.  SLP Short Term Goal 5 - Progress (Week 1): Progressing toward goal SLP Short Term Goal 6 (Week 1): Patient will consume current diet with minimal overt s/s of aspiration with Max A multimodal cues for use of swallow strategies.  SLP Short Term Goal 6 - Progress (Week 1): Progressing toward goal  Skilled Therapeutic Interventions: Skilled ST provided with focus on dysphagia and cognitive goals. Continued education with mother following MBS.  Pt was encouraged to sit up for trials, but refused to get back into wheelchair. Pt was observed with trials of advanced solid consistencies (fried chicken and green beans) while seated upright in bed. Max verbal encouragement by SLP and pt's mother was required for pt to accept trials. Brief, fleeting attention to task noted, with pt  returning to side lying position immediately. Once upright again, pt was given small sips of thin liquid via cup. Initially, pt took limited bolus size, 1 sip at a time, and did not exhibit cough response or voice quality change. Subsequent trials of thin via cup were larger in size, and resulted in immediate cough response. Pt continued to try to drink liquid while coughing, refusing to let go of the cup. Pt refused additional po trials of solid.  Updated safe swallow strategies were reviewed with mother and RN, and posted in pt room. Recommend pt be out of bed when eating and drinking. Will advance diet to dys 2, continuing 1:1 supervision during po intake. Liquids will continue to be thickened to honey consistency, but recommend trials of thin liquid via teaspoon during dysphagia therapy. Pt appears to tolerate meds crushed in puree, but pt's preference for a particular puree is highly variable.    FIM:  Comprehension Comprehension Mode: Auditory Comprehension: 4-Understands basic 75 - 89% of the time/requires cueing 10 - 24% of the time Expression Expression Mode: Verbal Expression: 3-Expresses basic 50 - 74% of the time/requires cueing 25 - 50% of the time. Needs to repeat parts of sentences. Social Interaction Social Interaction: 1-Interacts appropriately less than 25% of the time. May be withdrawn or combative. Problem Solving Problem Solving: 2-Solves basic 25 - 49% of the time - needs direction more than half the time to initiate, plan or complete simple activities Memory Memory: 2-Recognizes or recalls 25 - 49% of the time/requires cueing 51 - 75% of the  time FIM - Eating Eating Activity: 5: Needs verbal cues/supervision  Pain Pain Assessment Pain Assessment: Faces Pain Score: Asleep Faces Pain Scale: Hurts a little bit Pain Type: Acute pain Pain Location: Shoulder Pain Orientation: Left Patients Stated Pain Goal: 0 Pain Intervention(s): RN made aware  Therapy/Group: Individual  Therapy   Celia B. FernwoodBueche, Ozark HealthMSP, CCC-SLP [phone]  Leigh AuroraBueche, Celia Brown 01/21/2015, 3:55 PM
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
## 2015-01-21 NOTE — Progress Notes (Signed)
Occupational Therapy Session Note  Patient Details  Name: Leah ShieldsShanese Xxxclark MRN: [id number] Date of Birth: 06/28/1991  Today's Date: 01/21/2015 OT Individual Time: 0900-0940 OT Individual Time Calculation (min): 40 min  and Today's Date: 01/21/2015 OT Missed Time: 20 Minutes Missed Time Reason: Patient unwilling/refused to participate without medical reason   Short Term Goals: Week 1:  OT Short Term Goal 1 (Week 1): Pt will initiate 1 self care task with max cues OT Short Term Goal 2 (Week 1): Pt will complete bathing with mod assist and max cues for sequencing OT Short Term Goal 3 (Week 1): Pt will stand for 30 seconds during self-care task with min assist OT Short Term Goal 4 (Week 1): Pt will sustain attention to functional task for 30 seconds with max cues OT Short Term Goal 5 (Week 1): Pt will follow one-step commands during self-care task 75% of time with max cues  Skilled Therapeutic Interventions/Progress Updates:    Pt asleep in enclosure bed upon arrival.  Pt required min multimodal cues to arouse.  Pt rolled over onto back and stated, "I need to put my clothes on."  Clothing provided and patient sat EOB and picked up clothing and returned to supine in bed and covered up.  Pt required max multimodal cues to sit back up EOB but refused to don clothing.  Max encouragement provided and extra time to allow patient to process requests.  Pt sat EOB for approx 10 mins, refusing to don clothing.  Pt returned to supine and covered head with covers.  Pt refused to respond to verbal encouragement.  Pt finally requested this therapist to leave room and refused to communicate further.  Focus on sitting balance and active participation.  Therapy Documentation Precautions:  Precautions Precautions: Fall Restrictions Weight Bearing Restrictions: No General: General OT Amount of Missed Time: 20 Minutes Vital Signs: Therapy Vitals Temp:  (pt refused) Pain:  Pt with no s/s pain  See FIM for  current functional status  Therapy/Group: Individual Therapy  Rich BraveLanier, Timotheus Salm Chappell 01/21/2015, 9:55 AM

## 2015-01-21 NOTE — Progress Notes (Signed)
Occupational Therapy Session Note  Patient Details  Name: Leah ShieldsShanese Xxxclark MRN: [id number] Date of Birth: 09/19/1991  Today's Date: 01/21/2015 OT Individual Time: 1430-1510 OT Individual Time Calculation (min): 40 min  and Today's Date: 01/21/2015 OT Missed Time: 20 Minutes Missed Time Reason: Patient unwilling/refused to participate without medical reason   Short Term Goals: Week 1:  OT Short Term Goal 1 (Week 1): Pt will initiate 1 self care task with max cues OT Short Term Goal 2 (Week 1): Pt will complete bathing with mod assist and max cues for sequencing OT Short Term Goal 3 (Week 1): Pt will stand for 30 seconds during self-care task with min assist OT Short Term Goal 4 (Week 1): Pt will sustain attention to functional task for 30 seconds with max cues OT Short Term Goal 5 (Week 1): Pt will follow one-step commands during self-care task 75% of time with max cues  Skilled Therapeutic Interventions/Progress Updates:    Pt seen for 1:1 OT session with focus on attention, overall participation, initiation, and tolerance of therapist's presence. Pt received supine in enclosure initially refusing therapist via cursing and demonstrating inappropriate gestures. Pt's phone began to ring and pt initiated answering with increased time. Pt handed phone to therapist and it was kitchen calling for dinner order. Pt provided 2 items she would like for dinner with min cues. Engaged in therapeutic conversation with focus on pt's interests, importance of participation, and pt's needs. With increased time, pt communicated to therapist that she needed "hot pack." Therapist provided hot pack with pt squinting and verbalizing she couldn't read the writing for directions. Therapist activated hot pack then proceeded to discuss pt's visual deficits. Pt incorrectly identifying number of fingers therapists holding up. With increased time and max cues for answering questions, pt verbalizing "yes" to having dizziness,  blurred vision, and vision impacting ability to get OOB. Unable to determine complete accuracy of information secondary to cognitive deficits. Following discussion pt turning back to therapist and refusing further therapy at this time. Pt left with all needs in reach.   Therapy Documentation Precautions:  Precautions Precautions: Fall Restrictions Weight Bearing Restrictions: No General: General OT Amount of Missed Time: 20 Minutes PT Missed Treatment Reason: Patient unwilling to participate Vital Signs: Therapy Vitals Temp: 98.3 F (36.8 C) Temp Source: Oral Pulse Rate: 84 Resp: 16 BP: (!) 97/50 mmHg Patient Position (if appropriate): Lying Oxygen Therapy SpO2: 100 % O2 Device: Not Delivered Pain: Pain Assessment Pain Assessment: No/denies pain Pain Score: Asleep Faces Pain Scale: Hurts a little bit Pain Type: Acute pain Pain Location: Shoulder Pain Orientation: Left Patients Stated Pain Goal: 0 Pain Intervention(s): Medication (See eMAR) (scheduled pain medication)  See FIM for current functional status  Therapy/Group: Individual Therapy  Daneil Danerkinson, Orene Abbasi N 01/21/2015, 3:14 PM

## 2015-01-22 ENCOUNTER — Inpatient Hospital Stay (HOSPITAL_COMMUNITY): Payer: Self-pay

## 2015-01-22 ENCOUNTER — Inpatient Hospital Stay (HOSPITAL_COMMUNITY): Payer: Medicaid Other

## 2015-01-22 ENCOUNTER — Inpatient Hospital Stay (HOSPITAL_COMMUNITY): Payer: Medicaid Other | Admitting: Speech Pathology

## 2015-01-22 ENCOUNTER — Inpatient Hospital Stay (HOSPITAL_COMMUNITY): Payer: Medicaid Other | Admitting: Physical Therapy
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
## 2015-01-22 NOTE — Progress Notes (Signed)
Nursing Note: refused am meds,wbb

## 2015-01-22 NOTE — Progress Notes (Signed)
Nursing Note: Pt took Hs meds along with med that was due @ midnight.wbb

## 2015-01-22 NOTE — Progress Notes (Signed)
Speech Language Pathology Daily Session Note  Patient Details  Name: Leah Rojas MRN: [id number] Date of Birth: 06/22/1991  Today's Date: 01/22/2015 SLP Individual Time: 1430-1450 SLP Individual Time Calculation (min): 20 min and Today's Date: 01/22/2015 SLP Missed Time: 10 Minutes Missed Time Reason: Patient unwilling to participate;Patient fatigue  Short Term Goals: Week 2: SLP Short Term Goal 1 (Week 2): Patient will consume current diet with minimal overt s/s of aspiration with Mod A multimodal cues for use of swallow strategies.  SLP Short Term Goal 2 (Week 2): Patient will consume trials of thin liquids via teaspoon with minimal overt s/s of aspiration with Max A multimodal cues for use of swallow strategies.  SLP Short Term Goal 3 (Week 2): Patient will demonstrate sustained attention to functional tasks for 5 minutes with max A multimodal cues.  SLP Short Term Goal 4 (Week 2): Patient will engage/particiapte in 1 functional task per 30 minute session with Max A multimodal cues.  SLP Short Term Goal 5 (Week 2): Patient will improve safety awareness by allowing staff to assist her during functional tasks with Max A multimodal cues.   Skilled Therapeutic Interventions: Skilled treatment session focused on cognitive goals. Upon arrival, patient was supine in enclosure bed without family present and required Max verbal and tactile cues for arousal. Patient with limited social interaction and verbal expression with clinician despite Max multimodal cues. Patient answered basic questions in regards to wants/needs and reported stomach pain and required Max A visual and question cues for functional problem solving in regards to utilizing the call bell to request assistance, however, patient reported she did not want medication at this time. Patient engaged in a limited conversation about personal information but eventually became unresponsive to clinician's questions/requests, therefore, session  ended 10 minutes early. Patient left supine in secured enclosure bed with all needs within reach. Continue with current plan of care.    FIM:  Comprehension Comprehension Mode: Auditory Comprehension: 4-Understands basic 75 - 89% of the time/requires cueing 10 - 24% of the time Expression Expression Mode: Verbal Expression: 2-Expresses basic 25 - 49% of the time/requires cueing 50 - 75% of the time. Uses single words/gestures. Social Interaction Social Interaction: 1-Interacts appropriately less than 25% of the time. May be withdrawn or combative. Problem Solving Problem Solving: 1-Solves basic less than 25% of the time - needs direction nearly all the time or does not effectively solve problems and may need a restraint for safety Memory Memory: 2-Recognizes or recalls 25 - 49% of the time/requires cueing 51 - 75% of the time FIM - Eating Eating Activity: 5: Set-up assist for open containers;5: Supervision/cues  Pain Pain Assessment Pain Assessment: reported pain in stomach, unable to rate, declined medication   Therapy/Group: Individual Therapy  Chistian Kasler 01/22/2015, 5:11 PM
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
## 2015-01-22 NOTE — Progress Notes (Signed)
Social Work Patient ID: Leah Rojas, female   DOB: 12/07/1990, 24 y.o.   MRN: [id number]   Able to review team conference with pt's mother this morning.  Pt lying in enclosure bed with side open.  Mother aware of targeted d/c date of 5/13.  She is very concerned about pt's behaviors and her "ugly" and aggressive responses to family.  While mother states this, pt makes hand gesture toward her.  Explained to mother that team is also concerned and that our neuropsychologist did observe part of a PT session this morning to assist in establishing a behavior plan.  Mother aware that team will develop a behavior plan and will start this on Monday for consistency of staff.  While I was speaking with mother, pt turns over and tells me to "get out."  Mother is definitely in agreement with developing plan and, once completed, will review with her.   Dontavian Marchi, LCSW
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
## 2015-01-23 ENCOUNTER — Inpatient Hospital Stay (HOSPITAL_COMMUNITY): Payer: Medicaid Other | Admitting: Speech Pathology

## 2015-01-23 ENCOUNTER — Inpatient Hospital Stay (HOSPITAL_COMMUNITY): Payer: Medicaid Other | Admitting: Physical Therapy

## 2015-01-23 ENCOUNTER — Inpatient Hospital Stay (HOSPITAL_COMMUNITY): Payer: Medicaid Other | Admitting: Occupational Therapy

## 2015-01-23 ENCOUNTER — Inpatient Hospital Stay (HOSPITAL_COMMUNITY): Payer: Self-pay | Admitting: Occupational Therapy

## 2015-01-23 MED ORDER — QUETIAPINE FUMARATE 50 MG PO TABS
50.0000 mg | ORAL_TABLET | Freq: Every day | ORAL | Status: DC
  Administered 2015-01-23 – 2015-02-03 (×12): 50 mg via ORAL
  Filled 2015-01-23 (×16): qty 1

## 2015-01-23 MED ORDER — NYSTATIN 100000 UNIT/GM EX CREA
TOPICAL_CREAM | Freq: Two times a day (BID) | CUTANEOUS | Status: DC
  Administered 2015-01-23 – 2015-01-24 (×3): via TOPICAL
  Administered 2015-01-25 (×3): 1 via TOPICAL
  Administered 2015-01-26 – 2015-02-02 (×14): via TOPICAL
  Administered 2015-02-03: 1 via TOPICAL
  Administered 2015-02-03 – 2015-02-10 (×14): via TOPICAL
  Administered 2015-02-12: 1 via TOPICAL
  Administered 2015-02-12: 10:00:00 via TOPICAL
  Administered 2015-02-13: 1 via TOPICAL
  Filled 2015-01-23 (×4): qty 15

## 2015-01-23 MED ORDER — NYSTATIN 100000 UNIT/GM EX POWD
Freq: Three times a day (TID) | CUTANEOUS | Status: DC
  Administered 2015-01-23 – 2015-01-24 (×3): via TOPICAL
  Administered 2015-01-25 (×2): 1 g via TOPICAL
  Administered 2015-01-25 – 2015-02-13 (×45): via TOPICAL
  Filled 2015-01-23 (×3): qty 15

## 2015-01-23 MED ORDER — QUETIAPINE FUMARATE 100 MG PO TABS
100.0000 mg | ORAL_TABLET | Freq: Every day | ORAL | Status: DC
  Administered 2015-01-23 – 2015-01-25 (×2): 100 mg via ORAL
  Filled 2015-01-23 (×4): qty 1
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
## 2015-01-23 NOTE — Progress Notes (Signed)
Speech Language Pathology Daily Session Notes  Patient Details  Name: Leah Rojas MRN: [id number] Date of Birth: Feb 10, 1991  Today's Date: 01/23/2015  Session 1: SLP Individual Time: 1000-1025 SLP Individual Time Calculation (min): 25 min   Session 2: SLP Individual Time: 1500-1530 SLP Individual Time Calculation (min): 30 min  Short Term Goals: Week 2: SLP Short Term Goal 1 (Week 2): Patient will consume current diet with minimal overt s/s of aspiration with Mod A multimodal cues for use of swallow strategies.  SLP Short Term Goal 2 (Week 2): Patient will consume trials of thin liquids via teaspoon with minimal overt s/s of aspiration with Max A multimodal cues for use of swallow strategies.  SLP Short Term Goal 3 (Week 2): Patient will demonstrate sustained attention to functional tasks for 5 minutes with max A multimodal cues.  SLP Short Term Goal 4 (Week 2): Patient will engage/particiapte in 1 functional task per 30 minute session with Max A multimodal cues.  SLP Short Term Goal 5 (Week 2): Patient will improve safety awareness by allowing staff to assist her during functional tasks with Max A multimodal cues.   Skilled Therapeutic Interventions:  Session 1: Skilled treatment session focused on cognitive goals. Upon arrival, patient was awake while supine in bed and easily awakened to voice. Patient independently reported she wanted to brush her teeth but would not allow this clinician to assist with ambulation, therefore, RN provided assistance. Patient was unable to complete task and would only sip and expectorate X 1.  Patient sat in wheelchair and reported pain in her left arm, back and chest. RN present and aware. Patient propelled herself back to the enclosure bed and transferred with supervision A for safety.  Patient received a phone call and demonstrated appropriate social interaction while ordering her lunch meal.  Patient overall was more cooperative without agitation.  Patient left in secured enclosure bed with all needs within reach.    Session 2: Skilled treatment session focused on cognitive and dysphagia goals. Upon arrival, patient was awake while supine in bed and required encouragement and extra time to participate in session. SLP facilitated session by providing a variety of foods and liquids to increase PO intake. Patient consumed limited amounts of honey-thick liquids via cup and Dys. 1 textures without overt s/s of aspiration but reported her throat hurt. Patient utilized the phone to call her mother to request soup and required total A for problem solving with task, however, patient independently recalled the phone number. Patient continues to demonstrate increased cooperation without agitation this session. Patient's mother present at end of session and educated on progress. Patient's mother reported that the patient's increased cooperation could be due to incentive of the patient's daughter visiting tomorrow if patient had a better day today compared to yesterday.  Patient left in enclosure bed with mom present. Continue with current plan of care.    FIM:  Comprehension Comprehension Mode: Auditory Comprehension: 4-Understands basic 75 - 89% of the time/requires cueing 10 - 24% of the time Expression Expression Mode: Verbal Expression: 2-Expresses basic 25 - 49% of the time/requires cueing 50 - 75% of the time. Uses single words/gestures. Social Interaction Social Interaction: 2-Interacts appropriately 25 - 49% of time - Needs frequent redirection. Problem Solving Problem Solving: 1-Solves basic less than 25% of the time - needs direction nearly all the time or does not effectively solve problems and may need a restraint for safety Memory Memory: 3-Recognizes or recalls 50 - 74% of the time/requires cueing  25 - 49% of the time FIM - Eating Eating Activity: 5: Set-up assist for open containers;5: Supervision/cues  Pain Pain Assessment Pain  Assessment: No/denies pain Pain Score: Asleep  Therapy/Group: Individual Therapy  Valeriano Bain 01/23/2015, 4:56 PM

## 2015-01-23 NOTE — Progress Notes (Signed)
Occupational Therapy Session Note  Patient Details  Name: Leah Rojas MRN: [id number] Date of Birth: 03-12-1991  Today's Date: 01/23/2015 OT Individual Time: -  1630-1700 OT Individual Time Calculation (min): 30 min    Short Term Goals: Week 1:  OT Short Term Goal 1 (Week 1): Pt will initiate 1 self care task with max cues OT Short Term Goal 1 - Progress (Week 1): Met OT Short Term Goal 2 (Week 1): Pt will complete bathing with mod assist and max cues for sequencing OT Short Term Goal 2 - Progress (Week 1): Met OT Short Term Goal 3 (Week 1): Pt will stand for 30 seconds during self-care task with min assist OT Short Term Goal 3 - Progress (Week 1): Met OT Short Term Goal 4 (Week 1): Pt will sustain attention to functional task for 30 seconds with max cues OT Short Term Goal 4 - Progress (Week 1): Not met OT Short Term Goal 5 (Week 1): Pt will follow one-step commands during self-care task 75% of time with max cues OT Short Term Goal 5 - Progress (Week 1): Not met Week 2:  OT Short Term Goal 1 (Week 2): Pt will tolerate 20 min OOB during therapy session with max cues to increase functional activity tolerance OT Short Term Goal 2 (Week 2): Pt will demonstrate sustained attention to functional task for 30 seconds with max cues OT Short Term Goal 3 (Week 2): Pt will verbalize 1 cognitive or physical deficit with max cues OT Short Term Goal 4 (Week 2): Pt will complete dressing with min A and min cues for initiation and completion OT Short Term Goal 5 (Week 2): Pt will initiate 1 therapeutic activity with max cues   Skilled Therapeutic Interventions/Progress Updates:    Pt. Lying in veil bed.   Provided introduction and asked about number book on table.  Pt. Smiled and kept eyes opened for 1 minute.  She counted the items in the book and then would close eyes.  Pt would not agree to sit up and look at book.  She stated she would do it tomorrow.  Asked pt more questions to engage in grooming  activity.  She started declining in attention and increasing in frustration.  Pt. Verbalized for OT to zip up bed.  Pt. Left in bed with all sides zipped up.   Therapy Documentation Precautions:  Precautions Precautions: Fall Restrictions Weight Bearing Restrictions: No General: General OT Amount of Missed Time: 14 Minutes    Pain:  None indicated             See FIM for current functional status  Therapy/Group: Individual Therapy  Lisa Roca 01/23/2015, 6:16 PM

## 2015-01-23 NOTE — Progress Notes (Signed)
Nursing Note: Pt not void this shift. Offered fliuds regualrly .Took in 240 cc of honey thick apple juice.
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
## 2015-01-23 NOTE — Progress Notes (Signed)
Physical Therapy Weekly Progress Note  Patient Details  Name: Leah Rojas MRN: [id number] Date of Birth: Oct 24, 1990  Beginning of progress report period: January 15, 2015 End of progress report period: January 23, 2015  Today's Date: 01/23/2015 PT Individual Time: 1300-1340 PT Individual Time Calculation (min): 40 min   Patient has met 1 of 5 short term goals. Patient is demonstrating behaviors consistent with Rancho Level IV-V and requires max multimodal asssist from staff and family for participation in therapeutic activities, including any OOB activity. Patient has made minimal progress this reporting period due to overall lack of engagement and behavioral responses as demonstrated by inappropriate gestures, inappropriate language, throwing objects at therapists, physical agitation including kicking or "swatting" at staff, and closing eyes and refusing to respond to staff. When patient has chosen to participate, she demonstrated impulsive and unsafe behaviors with functional mobility requiring min-mod A for transfers and mod A to +2A for gait due to ataxia, severe forward lean, increased speed, and inconsistent ability to follow commands for improved safety. Patient requires max assist for participation, initiation of functional tasks, safety, sustained attention up to 1-2 min, and intellectual awareness.   Patient continues to demonstrate the following deficits: decreased participation, behavioral issues (cursing, kicking, gestures), muscle weakness, decreased cardiorespiratory endurance, ataxia, decreased coordination and decreased motor planning, impaired vision, decreased initiation, decreased attention, decreased awareness, decreased problem solving, decreased safety awareness, decreased memory and delayed processing and dizziness and therefore will continue to benefit from skilled PT intervention to enhance overall performance with activity tolerance, balance, postural control, ability to  compensate for deficits, functional use of  right upper extremity, right lower extremity, left upper extremity and left lower extremity, attention, awareness and coordination.  Patient not progressing toward long term goals. Plan of care revisions: Initiating behavior plan with treatment team next week, anticipate possible need to downgrade goals at next reporting period.  PT Short Term Goals Week 1:  PT Short Term Goal 1 (Week 1): Patient will sustain attention to functional task x 60 sec with max multimodal A.  PT Short Term Goal 1 - Progress (Week 1): Not met PT Short Term Goal 2 (Week 1): Patient will initiate functional mobility tasks with mod multimodal A 50% of time.  PT Short Term Goal 2 - Progress (Week 1): Not met PT Short Term Goal 3 (Week 1): Patient will perform dynamic standing balance x 3 min with max multimodal A.  PT Short Term Goal 3 - Progress (Week 1): Not met PT Short Term Goal 4 (Week 1): Patient will perform bed mobility with min A 50% of time.  PT Short Term Goal 4 - Progress (Week 1): Met PT Short Term Goal 5 (Week 1): Patient will ambulate 150 ft with consistent mod A x 1.  PT Short Term Goal 5 - Progress (Week 1): Not met Week 2:  PT Short Term Goal 1 (Week 2): Patient will sustain attention to functional task x 60 sec with max multimodal cues.  PT Short Term Goal 2 (Week 2): Patient will initiate functional mobility tasks with mod multimodal cues 50% of time.  PT Short Term Goal 3 (Week 2): Patient will perform dynamic standing balance x 3 min with mod A.  PT Short Term Goal 4 (Week 2): Patient will ambulate 150 ft with consistent mod A x 1.  PT Short Term Goal 5 (Week 2): Patient will tolerate OOB activity x 30 min with max multimodal cues.   Skilled Therapeutic Interventions/Progress Updates:   Session  focused on task initiation and completion, command following, sustained attention, active participation with therapeutic tasks, and functional mobility. Patient  received asleep in enclosure bed, easily aroused to voice. Patient responded better to female therapist than this therapist as demonstrated by telling this therapist that she smelled "like wet dog" and confirmed that this therapist was "irritating," therefore functional mobility performed with assist of female therapist. Patient instructed to complete 4 structured tasks of sitting up x 10 min, obtaining vitals in sitting, ambulating to/from therapy gym, and negotiating stairs before therapists would depart. Patient agreeable after therapist closed blinds due to light hurting eyes. Patient required min cues overall to initiate and complete tasks with extra time. Patient sat edge of bed x 6 min to don socks/shoes and vitals monitored. Gait training x 150 ft room > gym with min A HHA with patient demonstrated improved decreased pace, decreased BLE ataxia, and improved upright posture compared to previous sessions. Patient given 5 min supine rest break, with one minute warning before task of stairs and patient initiated getting up from mat independently. Patient negotiated up/down 4 stairs x2 using rails, overall mod A and patient initiating second trial. When ambulating back towards room, patient increased gait speed with decreased balance and control noted during task requiring mod-max A and verbal cues for safety. Patient agreeable to this therapist changing sheets on bed while patient sat in bedside chair. After therapist changed sheets, patient did impulsively ambulate towards bed and returned to sidelying with back towards therapist. With increased time and min cues, patient handed therapist pillows in order to change pillow cases. Patient declined further activity. Patient required frequent structured rest breaks to tolerate task demands but demonstrated overall improved cooperation and no signs of verbal/physical agitation. Patient left sidelying in enclosure bed with needs within reach.   Therapy  Documentation Precautions:  Precautions Precautions: Fall Restrictions Weight Bearing Restrictions: No General: PT Amount of Missed Time (min): 20 Minutes PT Missed Treatment Reason: Patient fatigue Pain:  Unrated L shoulder pain   See FIM for current functional status  Therapy/Group: Individual Therapy  Laretta Alstrom 01/23/2015, 1:54 PM

## 2015-01-24 ENCOUNTER — Inpatient Hospital Stay (HOSPITAL_COMMUNITY): Payer: Self-pay | Admitting: Speech Pathology

## 2015-01-24 DIAGNOSIS — R1314 Dysphagia, pharyngoesophageal phase: Secondary | ICD-10-CM
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
## 2015-01-24 NOTE — Progress Notes (Signed)
Patient was complaining of her eyes being blurry and hurting. Tramadol was given earlier for complaint of headache. Patient was just woken up by her family/daughter. Denies dark spots or double vision. Patient feeling sleepy at the moment. Will monitor.

## 2015-01-24 NOTE — Progress Notes (Signed)
Speech Language Pathology Daily Session Note  Patient Details  Name: Leah Rojas MRN: [id number] Date of Birth: 09/12/1991  Today's Date: 01/24/2015 SLP Individual Time: [id number] SLP Individual Time Calculation (min): 30 min  Short Term Goals:  Week 2: SLP Short Term Goal 1 (Week 2): Patient will consume current diet with minimal overt s/s of aspiration with Mod A multimodal cues for use of swallow strategies.   SLP Short Term Goal 2 (Week 2): Patient will consume trials of thin liquids via teaspoon with minimal overt s/s of aspiration with Max A multimodal cues for use of swallow strategies.   SLP Short Term Goal 3 (Week 2): Patient will demonstrate sustained attention to functional tasks for 5 minutes with max A multimodal cues.   SLP Short Term Goal 4 (Week 2): Patient will engage/particiapte in 1 functional task per 30 minute session with Max A multimodal cues.   SLP Short Term Goal 5 (Week 2): Patient will improve safety awareness by allowing staff to assist her during functional tasks with Max A multimodal cues.   Skilled Therapeutic Interventions: Skilled treatment session focused on dysphagia goals.  SLP facilitated session by providing mod A for patient to implement swallow strategies during meal.  Pt frequently took large bites and became impulsive with taking additional bites before swallowing bolus.  SLP provided verbal/tactile cues for patient to take small bites and one bite at a time.  SLP was unable to check for any pocketing or having patient eat more of her tray due to patient displaying combative behavior characterized by not wanting to follow safe swallow strategies.  Patient refused thin liquid trials by teaspoon and ate less than 25% of her breakfast tray.  Continue plan of care.   FIM:  Comprehension Comprehension Mode: Auditory Comprehension: 4-Understands basic 75 - 89% of the time/requires cueing 10 - 24% of the time Expression Expression Mode:  Verbal Expression: 3-Expresses basic 50 - 74% of the time/requires cueing 25 - 50% of the time. Needs to repeat parts of sentences. Social Interaction Social Interaction: 1-Interacts appropriately less than 25% of the time. May be withdrawn or combative. Problem Solving Problem Solving: 2-Solves basic 25 - 49% of the time - needs direction more than half the time to initiate, plan or complete simple activities Memory Memory: 2-Recognizes or recalls 25 - 49% of the time/requires cueing 51 - 75% of the time FIM - Eating Eating Activity: 5: Supervision/cues;5: Needs verbal cues/supervision;3: Helper scoops food on utensil every scoop  Pain Pain Assessment Pain Assessment: No/denies pain Pain Score: 0-No pain  Therapy/Group: Individual Therapy   Cranford MonElizabeth Dera Vanaken, MA CCC-SLP  Cranford MonKonrad, Ameyah Bangura A 01/24/2015, 10:45 AM

## 2015-01-25 ENCOUNTER — Inpatient Hospital Stay (HOSPITAL_COMMUNITY): Payer: Self-pay

## 2015-01-25 MED ORDER — QUETIAPINE FUMARATE 50 MG PO TABS
50.0000 mg | ORAL_TABLET | Freq: Every day | ORAL | Status: DC
  Administered 2015-01-25 – 2015-01-28 (×4): 50 mg via ORAL
  Filled 2015-01-25 (×5): qty 1

## 2015-01-25 NOTE — Progress Notes (Signed)
Patient has been refusing some of her medicine today (see MAR). Woke for a few minutes and went back to sleep. A family member came during lunch time and patient was able to eat lunch, about 20% of her meals. Refused to get out of bed to toilet. Was in bed most of the day. Last bowel movement was 4/26 as noted. Spoke to the patient regarding Sorbitol or Dulcolax to help her have a bowel movement, patient has refused both. Told her mom about it and she will ask her again and will let the RN know. Denies abdominal pain this shift. Will monitor.
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
## 2015-01-25 NOTE — Progress Notes (Signed)
Physical Therapy Note  Patient Details  Name: Leah ShieldsShanese Rojas MRN: [id number] Date of Birth: 01/30/1991 Today's Date: 01/25/2015    Attempted to see pt for treatment this date. Pt asleep, difficult to arouse but would open eyes with tactile cueing for brief period of time (<5 seconds). Therapist provided max encouragement and environmental cueing to attempt to rouse pt and encourage OOB therapy, but pt kept repeating "I don't feel good" and would close eyes again. RN made aware of pt's status. Pt missed 60 minutes of scheduled PT.   Hosie SpangleGodfrey, Arwa Yero 01/25/2015, 12:15 PM

## 2015-01-26 ENCOUNTER — Inpatient Hospital Stay (HOSPITAL_COMMUNITY): Payer: Medicaid Other

## 2015-01-26 ENCOUNTER — Inpatient Hospital Stay (HOSPITAL_COMMUNITY): Payer: Self-pay

## 2015-01-26 ENCOUNTER — Inpatient Hospital Stay (HOSPITAL_COMMUNITY): Payer: Medicaid Other | Admitting: Speech Pathology

## 2015-01-26 ENCOUNTER — Inpatient Hospital Stay (HOSPITAL_COMMUNITY): Payer: Medicaid Other | Admitting: Physical Therapy

## 2015-01-26 LAB — COMPREHENSIVE METABOLIC PANEL
ALK PHOS: 43 U/L (ref 38–126)
ALT: 12 U/L — ABNORMAL LOW (ref 14–54)
AST: 15 U/L (ref 15–41)
Albumin: 3 g/dL — ABNORMAL LOW (ref 3.5–5.0)
Anion gap: 9 (ref 5–15)
BUN: 11 mg/dL (ref 6–20)
CO2: 28 mmol/L (ref 22–32)
Calcium: 8.9 mg/dL (ref 8.9–10.3)
Chloride: 103 mmol/L (ref 101–111)
Creatinine, Ser: 0.63 mg/dL (ref 0.44–1.00)
GFR calc Af Amer: >60 mL/min (ref >60)
GFR calc non Af Amer: >60 mL/min (ref >60)
Glucose, Bld: 87 mg/dL (ref 70–99)
Potassium: 4.2 mmol/L (ref 3.5–5.1)
SODIUM: 140 mmol/L (ref 135–145)
Total Bilirubin: 0.4 mg/dL (ref 0.3–1.2)
Total Protein: 6.6 g/dL (ref 6.5–8.1)

## 2015-01-26 LAB — CBC
HCT: 33.9 % — ABNORMAL LOW (ref 36.0–46.0)
Hemoglobin: 11.1 g/dL — ABNORMAL LOW (ref 12.0–15.0)
MCH: 29.8 pg (ref 26.0–34.0)
MCHC: 32.7 g/dL (ref 30.0–36.0)
MCV: 91.1 fL (ref 78.0–100.0)
Platelets: 326 10*3/uL (ref 150–400)
RBC: 3.72 MIL/uL — ABNORMAL LOW (ref 3.87–5.11)
RDW: 13.8 % (ref 11.5–15.5)
WBC: 5.9 10*3/uL (ref 4.0–10.5)

## 2015-01-26 MED ORDER — METHYLPHENIDATE HCL 5 MG PO TABS
10.0000 mg | ORAL_TABLET | Freq: Two times a day (BID) | ORAL | Status: DC
  Administered 2015-01-26 – 2015-02-11 (×28): 10 mg via ORAL
  Filled 2015-01-26 (×33): qty 2
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
## 2015-01-26 NOTE — Progress Notes (Signed)
Speech Language Pathology Daily Session Note  Patient Details  Name: Leah Rojas MRN: [id number] Date of Birth: 01/20/1991  Today's Date: 01/26/2015 SLP Individual Time: [id number] SLP Individual Time Calculation (min): 15 min and Today's Date: 01/26/2015 SLP Missed Time: 15 Minutes Missed Time Reason: Patient unwilling to participate;Patient fatigue  Short Term Goals: Week 2: SLP Short Term Goal 1 (Week 2): Patient will consume current diet with minimal overt s/s of aspiration with Mod A multimodal cues for use of swallow strategies.  SLP Short Term Goal 2 (Week 2): Patient will consume trials of thin liquids via teaspoon with minimal overt s/s of aspiration with Max A multimodal cues for use of swallow strategies.  SLP Short Term Goal 3 (Week 2): Patient will demonstrate sustained attention to functional tasks for 5 minutes with max A multimodal cues.  SLP Short Term Goal 4 (Week 2): Patient will engage/particiapte in 1 functional task per 30 minute session with Max A multimodal cues.  SLP Short Term Goal 5 (Week 2): Patient will improve safety awareness by allowing staff to assist her during functional tasks with Max A multimodal cues.   Skilled Therapeutic Interventions: Skilled treatment session focused on cognitive goals in regards to arousal and active participation in therapeutic tasks. Patient received asleep in enclosure bed and required max verbal cues and light touch to arouse. Patient was able to open her eyes to focus attention but unable to keep her eyes open for more than 1-2 seconds. Clinician encouraged patient to sit EOB to consume lunch, however, patient declined despite multiple attempts. Clinician also attempted to engage patient in other self-care tasks, however, patient declined, therefore, patient missed remaining 15 minutes of session. Patient left supine in enclosure bed with all needs within reach. Continue with current plan of care.    FIM:   Comprehension Comprehension Mode: Auditory Comprehension: 4-Understands basic 75 - 89% of the time/requires cueing 10 - 24% of the time Expression Expression Mode: Verbal Expression: 3-Expresses basic 50 - 74% of the time/requires cueing 25 - 50% of the time. Needs to repeat parts of sentences. Social Interaction Social Interaction: 1-Interacts appropriately less than 25% of the time. May be withdrawn or combative. Problem Solving Problem Solving: 2-Solves basic 25 - 49% of the time - needs direction more than half the time to initiate, plan or complete simple activities Memory Memory: 2-Recognizes or recalls 25 - 49% of the time/requires cueing 51 - 75% of the time FIM - Eating Eating Activity: 5: Supervision/cues;5: Needs verbal cues/supervision  Pain Pain Assessment Pain Assessment: No/denies pain  Therapy/Group: Individual Therapy  Kenise Barraco 01/26/2015, 3:45 PM
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
## 2015-01-26 NOTE — Progress Notes (Signed)
Occupational Therapy Session Note  Patient Details  Name: Leah ShieldsShanese Xxxclark MRN: [id number] Date of Birth: 08/20/1991  Today's Date: 01/26/2015 OT Individual Time: [id number] and [id number] and [id number] OT Individual Time Calculation (min): 46 min and 33 min and 10 min  OT Missed Minutes: 14 min (refusal) and 21 min (refusal)   Short Term Goals: Week 2:  OT Short Term Goal 1 (Week 2): Pt will tolerate 20 min OOB during therapy session with max cues to increase functional activity tolerance OT Short Term Goal 2 (Week 2): Pt will demonstrate sustained attention to functional task for 30 seconds with max cues OT Short Term Goal 3 (Week 2): Pt will verbalize 1 cognitive or physical deficit with max cues OT Short Term Goal 4 (Week 2): Pt will complete dressing with min A and min cues for initiation and completion OT Short Term Goal 5 (Week 2): Pt will initiate 1 therapeutic activity with max cues   Skilled Therapeutic Interventions/Progress Updates:    Session 1: Pt seen for ADL retraining with focus on functional mobility, task initiation, organization, sequencing, attention, and activity tolerance. Pt received supine in bed with mother and family friend present. Pt agreeable to shower with mod cues of encouragement. Provided pt with 3 activities that must be completed during this therapy session (retrieve clothing, bathe, and get dressed). Pt ambulated to dresser with min-mod HHA. Pt retrieved 75% of clothing items with min cues. Pt ambulated to sink and initiated oral care without cues. Ambulated to bathroom with min-mod HHA and pt completed toileting and toilet transfer at overall min A for balance. Pt completed bathing with mod A secondary to fatigue and pt appropriately asking for assistance when needed. Pt ambulated to chair and completed LB dressing before impulsively standing and transferring to bed. Allowed pt rest break then completed UB dressing with mod cues of encouragement. Pt sat EOB to take  medicine from RN and mod cues of encouragement from staff and family. Pt declining further participation, therefore left in side lying with all needs in reach.   Session 2: Pt seen for 1:1 OT session with focus on active participation, command following, attention, and social interaction. Pt received supine in bed. Pt agreeable to allow therapist to heat up lunch however likely manipulative behavior to get therapist to leave as pt refusing upon return. Provided pt with 2 therapeutic activities that needed to be completed during session, however pt refusing both secondary to wanting to remain in bed. With max cues of encouragement, pt sat EOB 2x for approx 10-15 seconds to request needs from therapist. Pt demonstrating slightly improved social interaction as she was not cursing at therapist or using inappropriate gestures, however refusing participation in all therapeutic activities. Pt left supine in bed in enclosure bed with all needs in reach.  Session 3: Pt seen for 1:1 OT session with focus on active participation, visual assessment, and activity tolerance. Pt received supine in bed aroused by verbal cues and light touch. Pt declining all OOB activities however agreeable to sit EOB with max use to read birthday cards. Pt read from of 2/3 cards with increased time and pt noted to be squinting. On 3rd card, pt took from therapist and threw on floor then returned to supine. Educated pt on appropriate behaviors and consequences of refusing therapy. No carryover noted. Pt left supine in enclosure bed with all needs in reach.   Therapy Documentation Precautions:  Precautions Precautions: Fall Restrictions Weight Bearing Restrictions: No General:   Vital Signs:  Pain:   ADL:   Exercises:   Other Treatments:    See FIM for current functional status  Therapy/Group: Individual Therapy  Daneil Dan 01/26/2015, 8:49 AM

## 2015-01-26 NOTE — Progress Notes (Signed)
Patients family concerned about patients tiredness, dizziness, and headaches/neck pain. Mother noticed patient less tired with decreased amount to Tramadol given. Prn Tylenol given at Bedtime instead of Tramadol per family request. Patient more alert, answering questions appropriately.  Ambulating with unsteady gait to bathroom. Patient with 1+ hand healed assist. No unsafe behaviors with verbal cues. adm

## 2015-01-26 NOTE — Progress Notes (Signed)
Physical Therapy Session Note  Patient Details  Name: Leah ShieldsShanese Rojas MRN: [id number] Date of Birth: 04/09/1991  Today's Date: 01/26/2015 PT Individual Time: 1100-1138 PT Individual Time Calculation (min): 38 min   Short Term Goals: Week 2:  PT Short Term Goal 1 (Week 2): Patient will sustain attention to functional task x 60 sec with max multimodal cues.  PT Short Term Goal 2 (Week 2): Patient will initiate functional mobility tasks with mod multimodal cues 50% of time.  PT Short Term Goal 3 (Week 2): Patient will perform dynamic standing balance x 3 min with mod A.  PT Short Term Goal 4 (Week 2): Patient will ambulate 150 ft with consistent mod A x 1.  PT Short Term Goal 5 (Week 2): Patient will tolerate OOB activity x 30 min with max multimodal cues.   Skilled Therapeutic Interventions/Progress Updates:   Session focused on arousal and active participation in therapeutic tasks. Patient received asleep in enclosure bed, required mod verbal cues and light touch to arouse. Upon opening eyes and seeing therapist, patient stated, "I don't feel like it," and rolled away from therapist. Patient given one task to complete in order for therapist to leave. After rest break, attempted to engage patient with max encouragement for participation. Patient reported back pain, RN notified and attempted to administer medication. Patient refused to sit EOB, therefore HOB raised but patient refused medication and pushed RN away when she brought spoon toward to patient. Patient stated, "Leave me alone," to RN and therapist and pulled blankets up over her head multiple times during session. Patient left sidelying in enclosure bed with call bell within reach.   Therapy Documentation Precautions:  Precautions Precautions: Fall Restrictions Weight Bearing Restrictions: No General: PT Amount of Missed Time (min): 22 Minutes PT Missed Treatment Reason: Patient unwilling to participate Pain:  Unrated back pain, RN  notified, patient declined medication  See FIM for current functional status  Therapy/Group: Individual Therapy  Kerney ElbeVarner, Derry Kassel A 01/26/2015, 11:43 AM

## 2015-01-26 NOTE — Progress Notes (Signed)
Speech Language Pathology Daily Session Note  Patient Details  Name: Leah Rojas MRN: [id number] Date of Birth: 07/13/1991  Today's Date: 01/26/2015 SLP Individual Time: 1000-1030 SLP Individual Time Calculation (min): 30 min  Short Term Goals: Week 2: SLP Short Term Goal 1 (Week 2): Patient will consume current diet with minimal overt s/s of aspiration with Mod A multimodal cues for use of swallow strategies.  SLP Short Term Goal 2 (Week 2): Patient will consume trials of thin liquids via teaspoon with minimal overt s/s of aspiration with Max A multimodal cues for use of swallow strategies.  SLP Short Term Goal 3 (Week 2): Patient will demonstrate sustained attention to functional tasks for 5 minutes with max A multimodal cues.  SLP Short Term Goal 4 (Week 2): Patient will engage/particiapte in 1 functional task per 30 minute session with Max A multimodal cues.  SLP Short Term Goal 5 (Week 2): Patient will improve safety awareness by allowing staff to assist her during functional tasks with Max A multimodal cues.   Skilled Therapeutic Interventions: Skilled treatment session focused on cognitive and dysphagia goals. Upon arrival, patient was supine in enclosure bed and required Max verbal and tactile cues for arousal. With Max encouragement from a family friend and clinician, patient eventually agreed to consume breakfast meal of Dys. 2 textures with honey-thick liquids. Patient required Mod A verbal and tactile cues for utilization of small bites/sips and a slow rate of self-feeding. Patient demonstrated a wet vocal quality towards end of meal that cleared with a cued throat clear, suspect due to large, sequential sips of honey-thick liquids.  Patient left supine in enclosure bed with family friend present. Continue with current plan of care.    FIM:  Comprehension Comprehension Mode: Auditory Comprehension: 4-Understands basic 75 - 89% of the time/requires cueing 10 - 24% of the  time Expression Expression Mode: Verbal Expression: 3-Expresses basic 50 - 74% of the time/requires cueing 25 - 50% of the time. Needs to repeat parts of sentences. Social Interaction Social Interaction: 1-Interacts appropriately less than 25% of the time. May be withdrawn or combative. Problem Solving Problem Solving: 2-Solves basic 25 - 49% of the time - needs direction more than half the time to initiate, plan or complete simple activities Memory Memory: 2-Recognizes or recalls 25 - 49% of the time/requires cueing 51 - 75% of the time FIM - Eating Eating Activity: 5: Supervision/cues;5: Needs verbal cues/supervision  Pain Pain Assessment Pain Assessment: No/denies pain  Therapy/Group: Individual Therapy  Clema Skousen 01/26/2015, 3:16 PM

## 2015-01-27 ENCOUNTER — Inpatient Hospital Stay (HOSPITAL_COMMUNITY): Payer: Self-pay

## 2015-01-27 ENCOUNTER — Inpatient Hospital Stay (HOSPITAL_COMMUNITY): Payer: Medicaid Other | Admitting: Speech Pathology

## 2015-01-27 ENCOUNTER — Inpatient Hospital Stay (HOSPITAL_COMMUNITY): Payer: Medicaid Other | Admitting: Physical Therapy

## 2015-01-27 ENCOUNTER — Inpatient Hospital Stay (HOSPITAL_COMMUNITY): Payer: Medicaid Other

## 2015-01-27 LAB — URINALYSIS, ROUTINE W REFLEX MICROSCOPIC
Bilirubin Urine: NEGATIVE
Glucose, UA: NEGATIVE mg/dL
Hgb urine dipstick: NEGATIVE
KETONES UR: 15 mg/dL — AB
Nitrite: NEGATIVE
Protein, ur: 30 mg/dL — AB
Specific Gravity, Urine: 1.028 (ref 1.005–1.030)
Urobilinogen, UA: 1 mg/dL (ref 0.0–1.0)
pH: 8 (ref 5.0–8.0)

## 2015-01-27 LAB — URINE MICROSCOPIC-ADD ON

## 2015-01-27 LAB — VALPROIC ACID LEVEL: Valproic Acid Lvl: 60 ug/mL (ref 50.0–100.0)

## 2015-01-27 NOTE — Progress Notes (Signed)
Occupational Therapy Note  Patient Details  Name: Leah ShieldsShanese Xxxclark MRN: [id number] Date of Birth: 07/17/1991  Today's Date: 01/27/2015 OT Individual Time: 1100-1200 OT Individual Time Calculation (min): 60 min   Pt c/o irritation in left axillary; RN notified and topical medications applied Individual Therapy  Pt asleep in bed upon arrival.  Pt required min multimodal cues to arouse and respond to therapist's questions/commands.  Pt initially ignored commands and questions, opening eyes only but with no verbal response. I explained to patient that I could wait as long as it took and after approx 5 mins patient rolled over and stated that she was ready to get up.  Pt required more than reasonable amount of time to transition to sitting EOB.  Pt amb in room with min/mod assist for balance and trunk control. Pt walked to bulletin board to identify pictures with 100% accuracy.  Pt required use of toilet and amb with min A.  Pt completed toileting tasks with steady A. Pt completed washing hands while standing at sink.  Pt returned to enclosure bed.  Focus on activity tolerance, active participation, standing balance, functional amb without AD for home mgmt tasks, and safety awareness.    Lavone NeriLanier, Costantino [facility] 01/27/2015, 12:24 PM

## 2015-01-27 NOTE — Progress Notes (Signed)
Physical Therapy Session Note  Patient Details  Name: Leah Rojas MRN: [id number] Date of Birth: 01/08/1991  Today's Date: 01/27/2015 PT Individual Time: 1300-1330 PT Individual Time Calculation (min): 30 min   Short Term Goals: Week 2:  PT Short Term Goal 1 (Week 2): Patient will sustain attention to functional task x 60 sec with max multimodal cues.  PT Short Term Goal 2 (Week 2): Patient will initiate functional mobility tasks with mod multimodal cues 50% of time.  PT Short Term Goal 3 (Week 2): Patient will perform dynamic standing balance x 3 min with mod A.  PT Short Term Goal 4 (Week 2): Patient will ambulate 150 ft with consistent mod A x 1.  PT Short Term Goal 5 (Week 2): Patient will tolerate OOB activity x 30 min with max multimodal cues.   Skilled Therapeutic Interventions/Progress Updates:   Session focused on engagement in therapeutic tasks, attention, awareness, functional mobility training, standing balance, and activity tolerance. Patient received sitting edge of bed eating lunch with RN providing supervision. Patient very polite to therapist and pointed out chairs available to join. Patient returned to supine after finishing meal but agreeable to getting up after short rest. Patient requesting hot packs for back and ear pain, therefore agreeable to walking to RN station to request new hot pack. Patient ambulated with min HHA to RN station, identified RN, and appropriately requested hot pack before returning to room and bed. After second rest break, patient agreed to complete 2 tasks with therapy with mod-max coaxing. Patient ambulated to/from therapy gym with min A x 2 with improved upright posture and decreased ataxic gait. Patient continued to keep gaze directed at ground and answered "yes" to being dizzy but unable to provide further information. In gym, patient negotiated up/down 12 steps using 2 rails with min A overall before requesting to return to room. Patient declined  further self-care tasks/OOB activity and stated she was "done" with therapy. Patient reported goal for therapy tomorrow is "to do everything you ask." Patient left supine in enclosure bed with needs within reach.   Therapy Documentation Precautions:  Precautions Precautions: Fall Restrictions Weight Bearing Restrictions: No General: PT Amount of Missed Time (min): 30 Minutes PT Missed Treatment Reason: Patient fatigue Pain:  Unrated back pain, hot pack applied Locomotion : Ambulation Ambulation/Gait Assistance: 4: Min assist;1: +2 Total assist   See FIM for current functional status  Therapy/Group: Individual Therapy  Kerney ElbeVarner, Nelia Rogoff A 01/27/2015, 1:34 PM

## 2015-01-27 NOTE — Progress Notes (Signed)
Occupational Therapy Session Note  Patient Details  Name: Leah Rojas MRN: [id number] Date of Birth: 06/24/1991  Today's Date: 01/27/2015 OT Individual Time: [id number] OT Individual Time Calculation (min): 54 min  and Today's Date: 01/27/2015 OT Missed Time: 6 Minutes Missed Time Reason: Patient fatigue   Short Term Goals: Week 2:  OT Short Term Goal 1 (Week 2): Pt will tolerate 20 min OOB during therapy session with max cues to increase functional activity tolerance OT Short Term Goal 2 (Week 2): Pt will demonstrate sustained attention to functional task for 30 seconds with max cues OT Short Term Goal 3 (Week 2): Pt will verbalize 1 cognitive or physical deficit with max cues OT Short Term Goal 4 (Week 2): Pt will complete dressing with min A and min cues for initiation and completion OT Short Term Goal 5 (Week 2): Pt will initiate 1 therapeutic activity with max cues   Skilled Therapeutic Interventions/Progress Updates:    Pt seen for ADL retraining with focus on initiation, awareness, functional mobility, cognitive remediation, and standing balance. Pt received in enclosure bed with family friend present. Pt sat EOB with increased time and mod cues of encouragement to eat breakfast. Pt ate approx 20% of meal with mod cues for safe swallowing techniques and no over s/s of aspiration. Pt inquiring about therapy schedule therefore therapist wrote out in larger font and discussed. Pt squinting to read and demonstrated 75% accuracy with schedule. Pt continues to report blurry vision but no double vision. Pt initiated bathing via asking therapist to turn on water. Pt ambulated bed>bathroom with min A. Completed toileting with pt demonstrating improved safety when asking therapist to assist before standing. Pt transferred to shower however declining washing body, only wanting to run water over self. Pt ambulated back to bed and completed dressing with increased time and min cues for attention due to  fatigue. Pt required min A for standing balance during dressing task. Pt before emotionally labile stating "I'm never going to get better." Provided education on importance of participation in therapy in order to gain independence. Also discussed pt's goal with pt verbalizing 1 physical deficit without cues. Pt agreeable to participate in remaining therapy sessions this AM. Pt left side lying in bed with all needs in reach.   Therapy Documentation Precautions:  Precautions Precautions: Fall Restrictions Weight Bearing Restrictions: No General: General OT Amount of Missed Time: 6 Minutes Vital Signs: Therapy Vitals Temp: 98.5 F (36.9 C) Temp Source: Oral Pulse Rate: 81 Resp: 17 BP: (!) 106/56 mmHg Patient Position (if appropriate): Lying Oxygen Therapy SpO2: 100 % O2 Device: Not Delivered Pain: Pt reporting "headache." RN provided medication.  See FIM for current functional status  Therapy/Group: Individual Therapy  Daneil Danerkinson, Fey Coghill N 01/27/2015, 8:59 AM

## 2015-01-27 NOTE — Progress Notes (Signed)
Speech Language Pathology Daily Session Note  Patient Details  Name: Leah ShieldsShanese Rojas MRN: [id number] Date of Birth: 12/17/1990  Today's Date: 01/27/2015 SLP Individual Time: [id number] SLP Individual Time Calculation (min): 25 min  Short Term Goals: Week 2: SLP Short Term Goal 1 (Week 2): Patient will consume current diet with minimal overt s/s of aspiration with Mod A multimodal cues for use of swallow strategies.  SLP Short Term Goal 2 (Week 2): Patient will consume trials of thin liquids via teaspoon with minimal overt s/s of aspiration with Max A multimodal cues for use of swallow strategies.  SLP Short Term Goal 3 (Week 2): Patient will demonstrate sustained attention to functional tasks for 5 minutes with max A multimodal cues.  SLP Short Term Goal 4 (Week 2): Patient will engage/particiapte in 1 functional task per 30 minute session with Max A multimodal cues.  SLP Short Term Goal 5 (Week 2): Patient will improve safety awareness by allowing staff to assist her during functional tasks with Max A multimodal cues.   Skilled Therapeutic Interventions: Skilled treatment session focused on cognitive and dysphagia goals. SLP facilitated session by providing Min encouragement and extra time for patient to sit EOB to consume snack of Dys. 1 textures with honey-thick liquids. Patient consumed snack without overt s/s of aspiration and required Min A verbal cues for use of small bites and a slow rate of self-feeding. Patient engaged in a functional conversation with clinician in regards to current cognitive impairments and importance of participation to increase overall functional independence prior to discharge. Patient reported she does not feel like she will be ready to d/c on 5/13 and asked if she could stay longer if she agreed to participate in treatment sessions. Patient also demonstrated increased awareness by reporting she can't tolerate a large number of visitors at this time due to the noise  level. Overall, patient more cooperative and appeared to have increased awareness of her current situation. Patient left in enclosure bed with all needs within reach. Continue with current plan of care.    FIM:  Comprehension Comprehension Mode: Auditory Comprehension: 4-Understands basic 75 - 89% of the time/requires cueing 10 - 24% of the time Expression Expression Mode: Verbal Expression: 3-Expresses basic 50 - 74% of the time/requires cueing 25 - 50% of the time. Needs to repeat parts of sentences. Social Interaction Social Interaction: 3-Interacts appropriately 50 - 74% of the time - May be physically or verbally inappropriate. Problem Solving Problem Solving: 2-Solves basic 25 - 49% of the time - needs direction more than half the time to initiate, plan or complete simple activities Memory Memory: 2-Recognizes or recalls 25 - 49% of the time/requires cueing 51 - 75% of the time FIM - Eating Eating Activity: 5: Supervision/cues  Pain Pain Assessment Pain Assessment:8/10 right shoulder pain. Patient repositioned, RN made aware and premedicated. Patient also requested hot packs.   Therapy/Group: Individual Therapy  Leah Rojas 01/27/2015, 2:32 PM
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
## 2015-01-27 NOTE — Progress Notes (Signed)
Physical Therapy Session Note  Patient Details  Name: Leah ShieldsShanese Xxxclark MRN: [id number] Date of Birth: 07/09/1991  Today's Date: 01/27/2015 PT Individual Time: 1527-1550 PT Individual Time Calculation (min): 23 min   Short Term Goals: Week 2:  PT Short Term Goal 1 (Week 2): Patient will sustain attention to functional task x 60 sec with max multimodal cues.  PT Short Term Goal 2 (Week 2): Patient will initiate functional mobility tasks with mod multimodal cues 50% of time.  PT Short Term Goal 3 (Week 2): Patient will perform dynamic standing balance x 3 min with mod A.  PT Short Term Goal 4 (Week 2): Patient will ambulate 150 ft with consistent mod A x 1.  PT Short Term Goal 5 (Week 2): Patient will tolerate OOB activity x 30 min with max multimodal cues.   Skilled Therapeutic Interventions/Progress Updates:   Patient received resting in enclosure bed, declining OOB activity due to back pain. Patient required min encouragement to engage in therapeutic conversation regarding family, goals for therapy, and verbalizing needs for hot pack and cold drink. Patient sat edge of bed to consume honey thick fluid with no overt s/s of aspiration. Patient mother arrived and provided max encouragement for patient to ambulate with her, patient continued to decline and returned to supine. Patient with decreased tolerance to presence of therapist after mother arrived and told this therapist to "get out" and that she would walk in the morning. Anticipate patient overstimulated by mother's constant conversation with therapist, continue to reinforce family education/training. Patient left supine in enclosure bed with needs within reach and mother at bedside.    Therapy Documentation Precautions:  Precautions Precautions: Fall Restrictions Weight Bearing Restrictions: No Pain: Pain Assessment Pain Assessment: No/denies pain  See FIM for current functional status  Therapy/Group: Individual Therapy  Kerney ElbeVarner,  Boleslaus Holloway A 01/27/2015, 3:57 PM

## 2015-01-27 NOTE — Plan of Care (Signed)
Problem: RH BOWEL ELIMINATION Goal: RH STG MANAGE BOWEL WITH ASSISTANCE STG Manage Bowel with Assistance. Mod I  Outcome: Progressing No incontinent episode     

## 2015-01-28 ENCOUNTER — Inpatient Hospital Stay (HOSPITAL_COMMUNITY): Payer: Self-pay | Admitting: Speech Pathology

## 2015-01-28 ENCOUNTER — Inpatient Hospital Stay (HOSPITAL_COMMUNITY): Payer: Medicaid Other

## 2015-01-28 ENCOUNTER — Inpatient Hospital Stay (HOSPITAL_COMMUNITY): Payer: Medicaid Other | Admitting: Physical Therapy

## 2015-01-28 ENCOUNTER — Inpatient Hospital Stay (HOSPITAL_COMMUNITY): Payer: Self-pay

## 2015-01-28 LAB — URINE CULTURE

## 2015-01-28 MED ORDER — TROLAMINE SALICYLATE 10 % EX CREA: TOPICAL_CREAM | Freq: Two times a day (BID) | CUTANEOUS | Status: DC | PRN

## 2015-01-28 MED ORDER — MUSCLE RUB 10-15 % EX CREA
TOPICAL_CREAM | CUTANEOUS | Status: DC | PRN
  Administered 2015-01-29 – 2015-02-10 (×9): via TOPICAL
  Filled 2015-01-28: qty 85

## 2015-01-28 MED ORDER — AMOXICILLIN 500 MG PO CAPS
500.0000 mg | ORAL_CAPSULE | Freq: Three times a day (TID) | ORAL | Status: AC
  Administered 2015-01-28 – 2015-02-03 (×16): 500 mg via ORAL
  Filled 2015-01-28 (×21): qty 1

## 2015-01-28 NOTE — Progress Notes (Signed)
Speech Language Pathology Daily Session Notes  Patient Details  Name: Leah ShieldsShanese Xxxclark MRN: [id number] Date of Birth: 06/15/1991  Today's Date: 01/28/2015  Session 1: SLP Individual Time: 1000-1030 SLP Individual Time Calculation (min): 30 min   Session 2: SLP Individual Time: [id number] SLP Individual Time Calculation (min): 15 min Missed SLP Treatment Time (min: 15, fatigue and pain  Short Term Goals: Week 2: SLP Short Term Goal 1 (Week 2): Patient will consume current diet with minimal overt s/s of aspiration with Mod A multimodal cues for use of swallow strategies.  SLP Short Term Goal 2 (Week 2): Patient will consume trials of thin liquids via teaspoon with minimal overt s/s of aspiration with Max A multimodal cues for use of swallow strategies.  SLP Short Term Goal 3 (Week 2): Patient will demonstrate sustained attention to functional tasks for 5 minutes with max A multimodal cues.  SLP Short Term Goal 4 (Week 2): Patient will engage/particiapte in 1 functional task per 30 minute session with Max A multimodal cues.  SLP Short Term Goal 5 (Week 2): Patient will improve safety awareness by allowing staff to assist her during functional tasks with Max A multimodal cues.   Skilled Therapeutic Interventions:  Session 1: Skilled treatment session focused on cognitive goals. Upon arrival, patient was supine in enclosure bed but agreeable to participate in treatment session.  SLP facilitated session by providing Mod A visual and verbal cues for functional problem solving during basic money management task. Patient participated in a basic written expression task and required Mod A verbal cues for recall of her address.  Patient also recalled events from morning therapy sessions with Min A question cues and required Mod A question and visual cues for utilization of schedule to anticipate upcoming therapy sessions. Patient left supine in enclosure bed with all needs within reach. Continue with current  plan of care.    Session 2: 2nd attempt to see patient this afternoon due to initially declining to participate in session at 1400. Patient agreeable to participate in trials of thin liquids. SLP facilitated session by providing extra time and Min A verbal cues for functional problem solving during self-care tasks of brushing her teeth and using the commode and total A for safety with tasks. Patient consumed minimal trials of thin liquids via spoon and cup without overt s/s of aspiration, however, patient demonstrated silent aspiration per last MBS. Educated patient on need for repeat MBS before liquid upgrade, however, patient verbalized she doesn't "like the white stuff." Clinician will continue to provide education to patient to increase participation and overall success with study. Patient left supine in enclosure bed with all needs within reach. Continue with current plan of care.   FIM:  Comprehension Comprehension Mode: Auditory Comprehension: 4-Understands basic 75 - 89% of the time/requires cueing 10 - 24% of the time Expression Expression Mode: Verbal Expression: 3-Expresses basic 50 - 74% of the time/requires cueing 25 - 50% of the time. Needs to repeat parts of sentences. Social Interaction Social Interaction: 3-Interacts appropriately 50 - 74% of the time - May be physically or verbally inappropriate. Problem Solving Problem Solving: 2-Solves basic 25 - 49% of the time - needs direction more than half the time to initiate, plan or complete simple activities Memory Memory: 2-Recognizes or recalls 25 - 49% of the time/requires cueing 51 - 75% of the time  Pain Pain in left shoulder, unable to rate. RN made aware. Patient premedicated, repositioned and heat applied.   Therapy/Group: Individual Therapy  Sharai Overbay 01/28/2015, 4:04 PM
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
## 2015-01-28 NOTE — Progress Notes (Signed)
Physical Therapy Session Note  Patient Details  Name: Leah ShieldsShanese Xxxclark MRN: [id number] Date of Birth: 09/22/1991  Today's Date: 01/28/2015 PT Individual Time: 1100-1138 PT Individual Time Calculation (min): 38 min   Short Term Goals: Week 2:  PT Short Term Goal 1 (Week 2): Patient will sustain attention to functional task x 60 sec with max multimodal cues.  PT Short Term Goal 2 (Week 2): Patient will initiate functional mobility tasks with mod multimodal cues 50% of time.  PT Short Term Goal 3 (Week 2): Patient will perform dynamic standing balance x 3 min with mod A.  PT Short Term Goal 4 (Week 2): Patient will ambulate 150 ft with consistent mod A x 1.  PT Short Term Goal 5 (Week 2): Patient will tolerate OOB activity x 30 min with max multimodal cues.   Skilled Therapeutic Interventions/Progress Updates:   Pt required extra time and encouragement to arouse and engage with therapist (pt in bed asleep upon arrival) waking up with meaningful and pleasant conversation in regards to plan for therapy session and then asking to "give her a minute" and rollin over with covers pulled back up. Gave patient time to initiate and pt's mom arrived and encouraged pt as well. Finally, pt sat up EOB with S and min A for sit to stand with extra time. Gait with mod A around nurse's station with physical A for multiple LOB during turns and encouragement to continue or slow down pace as pt with tendency for swaying and drifting. Pt request to return back to room and bed and mother to remain at bedside with enclosure bed open. Pt declined further therapy during session and encouraged to participate in next session as pt stated I could tell Lurena JoinerRebecca "I'm not doing it" initially and then states she promised she would walk around the nurses's station with her too.  Pt missed 22 min of skilled PT due to refusal for further participation.  Therapy Documentation Precautions:  Precautions Precautions:  Fall Restrictions Weight Bearing Restrictions: No   Pain:  c/o pain in L shoulder - using heat packs for relief. Locomotion : Ambulation Ambulation/Gait Assistance: 3: Mod assist   See FIM for current functional status  Therapy/Group: Individual Therapy  Karolee StampsGray, Charise Leinbach [facility] 01/28/2015, 12:13 PM
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
## 2015-01-28 NOTE — Progress Notes (Signed)
Occupational Therapy Session Note  Patient Details  Name: Leah Rojas MRN: [id number] Date of Birth: 11/04/1990  Today's Date: 01/28/2015 OT Individual Time: [id number] OT Individual Time Calculation (min): 54 min  and  Today's Date: 01/28/2015 OT Missed Time: 6 Minutes Missed Time Reason: Patient unwilling/refused to participate without medical reason   Short Term Goals: Week 2:  OT Short Term Goal 1 (Week 2): Pt will tolerate 20 min OOB during therapy session with max cues to increase functional activity tolerance OT Short Term Goal 2 (Week 2): Pt will demonstrate sustained attention to functional task for 30 seconds with max cues OT Short Term Goal 3 (Week 2): Pt will verbalize 1 cognitive or physical deficit with max cues OT Short Term Goal 4 (Week 2): Pt will complete dressing with min A and min cues for initiation and completion OT Short Term Goal 5 (Week 2): Pt will initiate 1 therapeutic activity with max cues   Skilled Therapeutic Interventions/Progress Updates:    Pt asleep in enclosure bed upon arrival with aunt present.  Pt required mod multimodal cues to awaken and respond to therapist's questions/commands.  Pt spoke in muffled tones that required repeating to be understood.  Pt expressed frustration with having to repeat.  Pt required approx 15 mins to initiate sitting EOB and complete tasks.  Pt declined bathing this morning despite max encouragement by therapist, aunt, and mother (via telephone). Pt engaged in dressing tasks with sit<>stand from EOB.  Pt amb without AD (mod A for balance) to sink to wash hands and returned to EOB to eat breakfast.  Upon completion of breakfast, patient returned to supine and refused to participate any further, despite max encouragement.  Focus on active participation, following commands, sitting balance, functional amb without AD, and safety awareness.  Therapy Documentation Precautions:  Precautions Precautions: Fall Restrictions Weight  Bearing Restrictions: No General: General OT Amount of Missed Time: 6 Minutes Pain:  Pt c/o pain on left side of neck; hot pack provided  See FIM for current functional status  Therapy/Group: Individual Therapy  Rich BraveLanier, Leah Rojas 01/28/2015, 9:00 AM

## 2015-01-28 NOTE — Progress Notes (Signed)
Social Work Patient ID: Leah Rojas, female   DOB: 01/03/1991, 24 y.o.   MRN: [id number]   Have reviewed team conference information with pt's mother yesterday afternoon.  Mother pleased with pt's improvements overall the past couple of days and hopeful they will continue.  Aware we continue to aim for 5/13 d/c date.  Able to speak with pt this morning and she did engage very briefly and appropriately.  Laying in enclosure bed and reports fatigue from prior tx sessions.  Very pleasant.  Will continue to follow for support and d/c planning needs.  Braeson Rupe, LCSW
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
## 2015-01-29 ENCOUNTER — Inpatient Hospital Stay (HOSPITAL_COMMUNITY): Payer: Medicaid Other

## 2015-01-29 ENCOUNTER — Inpatient Hospital Stay (HOSPITAL_COMMUNITY): Payer: Medicaid Other | Admitting: Speech Pathology

## 2015-01-29 ENCOUNTER — Inpatient Hospital Stay (HOSPITAL_COMMUNITY): Payer: Self-pay | Admitting: Occupational Therapy

## 2015-01-29 ENCOUNTER — Inpatient Hospital Stay (HOSPITAL_COMMUNITY): Payer: Medicaid Other | Admitting: Physical Therapy

## 2015-01-29 MED ORDER — QUETIAPINE FUMARATE 50 MG PO TABS
75.0000 mg | ORAL_TABLET | Freq: Every day | ORAL | Status: DC
  Administered 2015-01-29 – 2015-02-01 (×3): 75 mg via ORAL
  Filled 2015-01-29 (×7): qty 1

## 2015-01-29 NOTE — Progress Notes (Signed)
Speech Language Pathology Weekly Progress and Session Notes  Patient Details  Name: Leah Rojas MRN: [id number] Date of Birth: Jun 26, 1991  Beginning of progress report period: January 22, 2015 End of progress report period: Jan 29, 2015  Today's Date: 01/29/2015 SLP Individual Time: 0930-1000 SLP Individual Time Calculation (min): 30 min  Short Term Goals: Week 2: SLP Short Term Goal 1 (Week 2): Patient will consume current diet with minimal overt s/s of aspiration with Mod A multimodal cues for use of swallow strategies.  SLP Short Term Goal 1 - Progress (Week 2): Not met SLP Short Term Goal 2 (Week 2): Patient will consume trials of thin liquids via teaspoon with minimal overt s/s of aspiration with Max A multimodal cues for use of swallow strategies.  SLP Short Term Goal 2 - Progress (Week 2): Met SLP Short Term Goal 3 (Week 2): Patient will demonstrate sustained attention to functional tasks for 5 minutes with max A multimodal cues.  SLP Short Term Goal 3 - Progress (Week 2): Met SLP Short Term Goal 4 (Week 2): Patient will engage/particiapte in 1 functional task per 30 minute session with Max A multimodal cues.  SLP Short Term Goal 4 - Progress (Week 2): Met SLP Short Term Goal 5 (Week 2): Patient will improve safety awareness by allowing staff to assist her during functional tasks with Max A multimodal cues.  SLP Short Term Goal 5 - Progress (Week 2): Not met    New Short Term Goals: Week 3: SLP Short Term Goal 1 (Week 3): Patient will consume current diet with minimal overt s/s of aspiration with Mod A multimodal cues for use of swallow strategies.  SLP Short Term Goal 2 (Week 3): Patient will consume trials of thin liquids via teaspoon with minimal overt s/s of aspiration with Mod A multimodal cues for use of swallow strategies.  SLP Short Term Goal 3 (Week 3): Patient will demonstrate sustained attention to functional tasks for 5 minutes with mod A multimodal cues.  SLP Short  Term Goal 4 (Week 3): Patient will engage/particiapte in 2 functional tasks per 30 minute session with Mod A multimodal cues.  SLP Short Term Goal 5 (Week 3): Patient will improve safety awareness by allowing staff to assist her during functional tasks with Max A multimodal cues.  SLP Short Term Goal 6 (Week 3): Patient will increase speech intelligibility at the phrase level with Mod A verbal cues.   Weekly Progress Updates: Patient has made functional and inconsistent gains and has met 3 of 5 STG's this reporting period due to increased participation/cooperation, attention and swallowing function. Patient is demonstrating behaviors consistent with a Rancho Level V and requires Mod-Max encouragement from staff and family for participation in treatment activities OOB, however, the patient's overall participation has improved this reporting period and patient also demonstrates decreased physical agitation but continues to demonstrate intermittent verbal agitation.  Overall, patient requires Mod-Max A multimodal cues for participation, initiation, sustained attention for 5 minutes, safety awareness, intellectual awareness and functional problem solving. Patient is currently consuming Dys. 2 textures with honey-thick liquids with minimal overt s/s of aspiration with Mod-Max A multimodal cues needed for use of swallow strategies. Patient is also consuming trials of thin liquids via teaspoon without overt s/s of aspiration, however, patient will need repeat MBS prior to upgrade. At this time, patient's participation and behavior are not consistent enough to attempt repeat study, will continue to monitor. Patient would benefit from continued skilled SLP intervention to maximize her cognitive  and swallowing function in order to maximize her overall functional independence prior to upgrade.    Intensity: Minumum of 1-2 x/day, 30 to 90 minutes Frequency: 3 to 5 out of 7 days Duration/Length of Stay:  02/06/15 Treatment/Interventions: Patient/family education;Environmental Environmental consultant;Therapeutic Activities;Dysphagia/aspiration precaution training;Internal/external aids;Functional tasks;Cognitive remediation/compensation;Speech/Language facilitation   Daily Session  Skilled Therapeutic Interventions: Skilled treatment session focused on cognitive and dysphagia goals. SLP facilitated session by providing Max A verbal cues for use of safe swallow strategies with breakfast meal of Dys. 2 textures with honey-thick liquids. Patient demonstrated an intermittent wet vocal quality that cleared with a spontaneous throat clear, suspect due to large consecutive sips of liquids. Patient independently requested wants/needs to this clinician in regards to personal care items and performed sit to stand X 1 with supervision verbal cues to perform self-care task. Patient was ~75% intelligible at the phrase level and required Mod A verbal cues for use of an increased vocal intensity. Patient was calm and cooperative throughout the session. Patient left with RN. Continue with current plan of care.     FIM:  Comprehension Comprehension Mode: Auditory Comprehension: 4-Understands basic 75 - 89% of the time/requires cueing 10 - 24% of the time Expression Expression Mode: Verbal Expression: 3-Expresses basic 50 - 74% of the time/requires cueing 25 - 50% of the time. Needs to repeat parts of sentences. Social Interaction Social Interaction: 3-Interacts appropriately 50 - 74% of the time - May be physically or verbally inappropriate. Problem Solving Problem Solving: 3-Solves basic 50 - 74% of the time/requires cueing 25 - 49% of the time Memory Memory: 3-Recognizes or recalls 50 - 74% of the time/requires cueing 25 - 49% of the time FIM - Eating Eating Activity: 5: Supervision/cues Pain Pain in shoulder, RN made aware. Muscle rub applied by RN and patient repositioned.   Therapy/Group: Individual  Therapy  Leah Rojas 01/29/2015, 4:01 PM
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
## 2015-01-29 NOTE — Progress Notes (Signed)
Physical Therapy Weekly Progress Note  Patient Details  Name: Leah Rojas MRN: [id number] Date of Birth: 10-17-1990  Beginning of progress report period: January 23, 2015 End of progress report period: Jan 29, 2015  Today's Date: 01/29/2015 PT Individual Time: 1100-1148 PT Individual Time Calculation (min): 48 min   Patient has met 2 of 5 short term goals. Patient has made inconsistent progress this reporting period due to increased participation and attention. Patient is demonstrating behaviors consistent with [place]. Patient with variable participation requiring max encouragement from family and staff but overall improved participation and OOB tolerance. Patient has not demonstrated physical agitation although continues to be verbally inappropriate at times. Patient demonstrates impulsive behavior and decreased safety awareness with functional mobility and impaired standing balance requiring min A overall for transfers and stair negotiation using rails and mod A overall for ambulation.   Patient continues to demonstrate the following deficits: muscle weakness, decreased endurance, decreased OOB tolerance, decreased standing balance and balance strategies, interrupted sleep/wake cycle, decreased attention, decreased safety awareness, decreased intellectual > emergent awareness, decreased problem solving, decreased initiation, decreased sequencing, decreased coordination, decreased tolerance to therapy staff, ataxia, impaired vision and therefore will continue to benefit from skilled PT intervention to enhance overall performance with activity tolerance, balance, postural control, ability to compensate for deficits, functional use of  right upper extremity, right lower extremity, left upper extremity and left lower extremity, attention, awareness and coordination.  Patient progressing toward long term goals.  Continue plan of care.   PT Short Term Goals Week 2:  PT Short Term Goal 1 (Week  2): Patient will sustain attention to functional task x 60 sec with max multimodal cues.  PT Short Term Goal 1 - Progress (Week 2): Met PT Short Term Goal 2 (Week 2): Patient will initiate functional mobility tasks with mod multimodal cues 50% of time.  PT Short Term Goal 2 - Progress (Week 2): Progressing toward goal PT Short Term Goal 3 (Week 2): Patient will perform dynamic standing balance x 3 min with mod A.  PT Short Term Goal 3 - Progress (Week 2): Progressing toward goal PT Short Term Goal 4 (Week 2): Patient will ambulate 150 ft with consistent mod A x 1.  PT Short Term Goal 4 - Progress (Week 2): Met PT Short Term Goal 5 (Week 2): Patient will tolerate OOB activity x 30 min with max multimodal cues.  PT Short Term Goal 5 - Progress (Week 2): Progressing toward goal Week 3:  PT Short Term Goal 1 (Week 3): Patient will sustain attention to functional task x 10 min with max multimodal cues.  PT Short Term Goal 2 (Week 3): Patient will initiate functional mobility tasks with mod multimodal cues 50% of time.  PT Short Term Goal 3 (Week 3): Patient will perform dynamic standing balance x 3 min with min A.  PT Short Term Goal 4 (Week 3): Patient will ambulate 150 ft with consistent min A x 1.  PT Short Term Goal 5 (Week 3): Patient will consistently tolerate OOB activity x 45 min with max multimodal cues.   Skilled Therapeutic Interventions/Progress Updates:  Patient seen for skilled co-treat with neuropsychologist for first half of session to address initiation, awareness, engagement in therapeutic tasks, ambulation, path finding, and OOB activity tolerance. With max encouragement and education, patient agreeable to complete 3 functional tasks OOB within time constraints of session. Patient required approx 15 min to sit edge of bed and agreeable to allowing this therapist to  assist her with mobility. Gait training with HHA and min-mod A overall for balance to/from ortho gym with brief seated  rest at chairs located by elevator. Patient remained sitting edge of mat while waiting for other patient to complete car transfer, then patient performed simulated car transfer to sedan height with min A overall, assist to organize, sequence, and buckle seat belt. Patient instructed to ambulate to main gym to negotiate stairs but initiated stair training in ortho gym up/down six 6.5" steps using 2 rails with S-min A overall. Patient did not follow therapist's instructions for topographical orientation back to room and ambulated to end of [place] before recognizing error and turning around, max cues to return to room. While ambulating back towards room, patient became emotional with this therapist and neuropsychologist, yelling, "Stop embarrassing me! You're always trying to embarrass me." After seated rest break at chairs by elevator, therapist apologized to patient for feeling "embarrassed" due to need for assist during ambulation and educated on purpose of therapy to "re-train balance system." Patient sat at bedside chair and RN notified of request for pain medication. After RN administered pain medication, patient returned to bed with supervision and left supine in enclosure bed with all needs within reach, declining further participation or self care tasks.   Therapy Documentation Precautions:  Precautions Precautions: Fall Restrictions Weight Bearing Restrictions: No General: PT Amount of Missed Time (min): 12 Minutes PT Missed Treatment Reason: Patient unwilling to participate Pain:   Unrated generalized L shoulder pain, RN notified and administered muscle rub and pain medication Locomotion : Ambulation Ambulation/Gait Assistance: 4: Min assist;3: Mod assist   See FIM for current functional status  Therapy/Group: Co-Treatment with neuropsychologist  Carney Living A 01/29/2015, 12:21 PM

## 2015-01-30 ENCOUNTER — Inpatient Hospital Stay (HOSPITAL_COMMUNITY): Payer: Medicaid Other | Admitting: Speech Pathology

## 2015-01-30 ENCOUNTER — Inpatient Hospital Stay (HOSPITAL_COMMUNITY): Payer: Self-pay

## 2015-01-30 ENCOUNTER — Inpatient Hospital Stay (HOSPITAL_COMMUNITY): Payer: Medicaid Other | Admitting: Occupational Therapy

## 2015-01-30 ENCOUNTER — Inpatient Hospital Stay (HOSPITAL_COMMUNITY): Payer: Medicaid Other | Admitting: Physical Therapy

## 2015-01-30 MED ORDER — HYDROCERIN EX CREA
TOPICAL_CREAM | Freq: Two times a day (BID) | CUTANEOUS | Status: DC
  Administered 2015-01-30 – 2015-02-01 (×5): via TOPICAL
  Administered 2015-02-03: 1 via TOPICAL
  Administered 2015-02-04 – 2015-02-07 (×6): via TOPICAL
  Administered 2015-02-08: 1 via TOPICAL
  Administered 2015-02-09 – 2015-02-13 (×5): via TOPICAL
  Filled 2015-01-30: qty 113

## 2015-01-30 NOTE — Progress Notes (Signed)
Speech Language Pathology Daily Session Note  Patient Details  Name: Leah ShieldsShanese Xxxclark MRN: [id number] Date of Birth: 02/22/1991  Today's Date: 01/30/2015 SLP Individual Time: 1300-1400 SLP Individual Time Calculation (min): 60 min  Short Term Goals: Week 3: SLP Short Term Goal 1 (Week 3): Patient will consume current diet with minimal overt s/s of aspiration with Mod A multimodal cues for use of swallow strategies.  SLP Short Term Goal 1 - Progress (Week 3): Progressing toward goal SLP Short Term Goal 2 (Week 3): Patient will consume trials of thin liquids via teaspoon with minimal overt s/s of aspiration with Mod A multimodal cues for use of swallow strategies.  SLP Short Term Goal 2 - Progress (Week 3): Progressing toward goal SLP Short Term Goal 3 (Week 3): Patient will demonstrate sustained attention to functional tasks for 5 minutes with mod A multimodal cues.  SLP Short Term Goal 3 - Progress (Week 3): Progressing toward goal SLP Short Term Goal 4 (Week 3): Patient will engage/particiapte in 2 functional tasks per 30 minute session with Mod A multimodal cues.  SLP Short Term Goal 4 - Progress (Week 3): Progressing toward goal SLP Short Term Goal 5 (Week 3): Patient will improve safety awareness by allowing staff to assist her during functional tasks with Max A multimodal cues.  SLP Short Term Goal 5 - Progress (Week 3): Progressing toward goal SLP Short Term Goal 6 (Week 3): Patient will increase speech intelligibility at the phrase level with Mod A verbal cues.  SLP Short Term Goal 6 - Progress (Week 3): Progressing toward goal  Skilled Therapeutic Interventions: Skilled SLP therapy session focused on cogntion and swallowing.  With mod verbal cues to inhibit impulsivity (rate and quantity during self-feeding), pt consumed chopped chicken, honey-thick liquids, and tspn trials of thin liquids with occasional throat-clearing but overall adequate toleration.  Pt verbalized recall of  precautions to chew thoroughly and slow rate, but continued requiring mod cues for execution.  During basic money management tasks, pt required mod verbal/visual cues to repeat target info multiple times in an effort to retain instructions, then was able to implement task with mod cues.  Verbal problem-solving >functional, primarily due to rate/impulsivity of response without allowing ample time for self-monitoring.  Money-sorting task also improved when pt provided mod verbal/tactile cues to slow down and act with deliberation.  Pt making demonstrable gains toward goals.     FIM:  Comprehension Comprehension Mode: Auditory Comprehension: 4-Understands basic 75 - 89% of the time/requires cueing 10 - 24% of the time Expression Expression Mode: Verbal Expression: 3-Expresses basic 50 - 74% of the time/requires cueing 25 - 50% of the time. Needs to repeat parts of sentences. Social Interaction Social Interaction: 3-Interacts appropriately 50 - 74% of the time - May be physically or verbally inappropriate. Problem Solving Problem Solving: 3-Solves basic 50 - 74% of the time/requires cueing 25 - 49% of the time Memory Memory: 3-Recognizes or recalls 50 - 74% of the time/requires cueing 25 - 49% of the time  Pain Pain Assessment Pain Assessment: No/denies pain  Therapy/Group: Individual Therapy  Blenda MountsCouture, Keyion Knack Laurice 01/30/2015, 3:39 PM

## 2015-01-30 NOTE — Progress Notes (Signed)
Occupational Therapy Session Note  Patient Details  Name: Leah Rojas MRN: [id number] Date of Birth: 1991/03/03  Today's Date: 01/30/2015 OT Individual Time: 1700-1730 OT Individual Time Calculation (min): 30 min    Short Term Goals: Week 1:  OT Short Term Goal 1 (Week 1): Pt will initiate 1 self care task with max cues OT Short Term Goal 1 - Progress (Week 1): Met OT Short Term Goal 2 (Week 1): Pt will complete bathing with mod assist and max cues for sequencing OT Short Term Goal 2 - Progress (Week 1): Met OT Short Term Goal 3 (Week 1): Pt will stand for 30 seconds during self-care task with min assist OT Short Term Goal 3 - Progress (Week 1): Met OT Short Term Goal 4 (Week 1): Pt will sustain attention to functional task for 30 seconds with max cues OT Short Term Goal 4 - Progress (Week 1): Not met OT Short Term Goal 5 (Week 1): Pt will follow one-step commands during self-care task 75% of time with max cues OT Short Term Goal 5 - Progress (Week 1): Not met Week 2:  OT Short Term Goal 1 (Week 2): Pt will tolerate 20 min OOB during therapy session with max cues to increase functional activity tolerance OT Short Term Goal 1 - Progress (Week 2): Met OT Short Term Goal 2 (Week 2): Pt will demonstrate sustained attention to functional task for 30 seconds with max cues OT Short Term Goal 2 - Progress (Week 2): Met OT Short Term Goal 3 (Week 2): Pt will verbalize 1 cognitive or physical deficit with max cues OT Short Term Goal 3 - Progress (Week 2): Met OT Short Term Goal 4 (Week 2): Pt will complete dressing with min A and min cues for initiation and completion OT Short Term Goal 4 - Progress (Week 2): Not met OT Short Term Goal 5 (Week 2): Pt will initiate 1 therapeutic activity with max cues  OT Short Term Goal 5 - Progress (Week 2): Met Week 3:  OT Short Term Goal 1 (Week 3): Pt will tolerate 30 min OOB during therapy session with mod cues to increase functional activity  tolerance OT Short Term Goal 2 (Week 3): Pt will initiate 1 therapeutic activity (other than ADL) with max cues OT Short Term Goal 3 (Week 3): Pt will sustain attention to functional task for 3 min with max cues OT Short Term Goal 4 (Week 3): Pt will stand for 2 min during functional activity with min A for dynamic standing balance Week 4:     Skilled Therapeutic Interventions/Progress Updates:   Addressed  functional amb for home mgmt tasks, sit<>stand, standing balance, and safety awareness, cognitive remediation. Pt. Donned shoes and socks with SBA.  She ambulated with HHA and mod assist for balance to bathroom.  She managed all toillet needs with close supervision.  Ambulated to gym and engaged in using nustep.  Pt attended to activity for 3 minutes at 3 workload.  Stepped up 2 steps with handrails and mod assist for balance.  Ambulated back to room and placed in vail bed with all needs in place.    Therapy Documentation Precautions:  Precautions Precautions: Fall Restrictions Weight Bearing Restrictions: No       Pain: Pain Assessment Pain Assessment: No/denies pain           See FIM for current functional status  Therapy/Group: Individual Therapy  Lisa Roca 01/30/2015, 5:44 PM

## 2015-01-30 NOTE — Progress Notes (Signed)
Occupational Therapy Session Note  Patient Details  Name: Leah Rojas MRN: [id number] Date of Birth: 10/20/1990  Today's Date: 01/30/2015 OT Individual Time: 1000-1053 OT Individual Time Calculation (min): 53 min  and Today's Date: 01/30/2015 OT Missed Time: 7 Minutes Missed Time Reason: Patient unwilling/refused to participate without medical reason   Short Term Goals: Week 3:  OT Short Term Goal 1 (Week 3): Pt will tolerate 30 min OOB during therapy session with mod cues to increase functional activity tolerance OT Short Term Goal 2 (Week 3): Pt will initiate 1 therapeutic activity (other than ADL) with max cues OT Short Term Goal 3 (Week 3): Pt will sustain attention to functional task for 3 min with max cues OT Short Term Goal 4 (Week 3): Pt will stand for 2 min during functional activity with min A for dynamic standing balance  Skilled Therapeutic Interventions/Progress Updates:    Pt resting in enclosure bed upon arrival and agreeable to taking shower.  Pt's aunt present and provided appropriate encouragement.  Pt amb without AD (steady A) to shower to complete bathing tasks with sit<>stand.  Pt required min verbal cues for thoroughness.  Pt completed dressing tasks with sit<>stand from chair. Pt required more than reasonable amount of time for all tasks with min enouragement/verbal cues for task initiation.  Focus on continued active participation, functional amb for home mgmt tasks, sit<>stand, standing balance, and safety awareness.  Therapy Documentation Precautions:  Precautions Precautions: Fall Restrictions Weight Bearing Restrictions: No General: General OT Amount of Missed Time: 7 Minutes Pain: Pain Assessment Pain Score: 5  Pain Type: Acute pain Pain Location: Shoulder Pain Orientation: Left Pain Descriptors / Indicators: Aching Pain Onset: On-going  See FIM for current functional status  Therapy/Group: Individual Therapy  Rich BraveLanier, Keishla Oyer Chappell 01/30/2015,  11:03 AM

## 2015-01-30 NOTE — Progress Notes (Signed)
Speech Language Pathology Daily Session Note  Patient Details  Name: Leah Rojas MRN: [id number] Date of Birth: 01/11/1991  Today's Date: 01/30/2015 SLP Individual Time: 2952-84131430-1515 SLP Individual Time Calculation (min): 45 min and Today's Date: 01/30/2015 SLP Missed Time: 15 Minutes Missed Time Reason: Patient unwilling to participate  Short Term Goals: Week 3: SLP Short Term Goal 1 (Week 3): Patient will consume current diet with minimal overt s/s of aspiration with Mod A multimodal cues for use of swallow strategies.  SLP Short Term Goal 1 - Progress (Week 3): Progressing toward goal SLP Short Term Goal 2 (Week 3): Patient will consume trials of thin liquids via teaspoon with minimal overt s/s of aspiration with Mod A multimodal cues for use of swallow strategies.  SLP Short Term Goal 2 - Progress (Week 3): Progressing toward goal SLP Short Term Goal 3 (Week 3): Patient will demonstrate sustained attention to functional tasks for 5 minutes with mod A multimodal cues.  SLP Short Term Goal 3 - Progress (Week 3): Progressing toward goal SLP Short Term Goal 4 (Week 3): Patient will engage/particiapte in 2 functional tasks per 30 minute session with Mod A multimodal cues.  SLP Short Term Goal 4 - Progress (Week 3): Progressing toward goal SLP Short Term Goal 5 (Week 3): Patient will improve safety awareness by allowing staff to assist her during functional tasks with Max A multimodal cues.  SLP Short Term Goal 5 - Progress (Week 3): Progressing toward goal SLP Short Term Goal 6 (Week 3): Patient will increase speech intelligibility at the phrase level with Mod A verbal cues.  SLP Short Term Goal 6 - Progress (Week 3): Progressing toward goal  Skilled Therapeutic Interventions: Skilled treatment session focused on cognition and swallowing goals.  SLP facilitated session by administering the [facility] Assessment (MoCA). Patient scored an 11/30 with a score of 26 or above considered  normal. Patient demonstrated deficits in the areas of recall, attention and visuospatial tasks. Patient independently recalled she needed to retrieve her laundry and propelled herself to and from the laundry room with Mod I. Patient also consumed trials of thin liquids via tsp and cup and demonstrated overt cough X 1 with intermittent throat clear X 2. Patient was able to recognize overt s/s of aspiration due to rate of intake, however, continued to require Mod verbal cues for pacing. Patient independently recalled that she had another therapy session at 5pm today and told this clinician, "she is off the clock until 5" and declined to participate in remaining 15 minutes of session.  Patient left supine in enclosure bed with all needs within reach. Continue with current plan of care.    FIM:  Comprehension Comprehension Mode: Auditory Comprehension: 4-Understands basic 75 - 89% of the time/requires cueing 10 - 24% of the time Expression Expression Mode: Verbal Expression: 3-Expresses basic 50 - 74% of the time/requires cueing 25 - 50% of the time. Needs to repeat parts of sentences. Social Interaction Social Interaction: 3-Interacts appropriately 50 - 74% of the time - May be physically or verbally inappropriate. Problem Solving Problem Solving: 3-Solves basic 50 - 74% of the time/requires cueing 25 - 49% of the time Memory Memory: 3-Recognizes or recalls 50 - 74% of the time/requires cueing 25 - 49% of the time FIM - Eating Eating Activity: 5: Supervision/cues  Pain Pain Assessment Pain Assessment: No/denies pain  Therapy/Group: Individual Therapy  Cedarius Kersh 01/30/2015, 4:32 PM

## 2015-01-30 NOTE — Progress Notes (Signed)
Physical Therapy Session Note  Patient Details  Name: Leah Rojas MRN: [id number] Date of Birth: 11/07/1990  Today's Date: 01/30/2015 PT Individual Time: 0800-0900 PT Individual Time Calculation (min): 60 min   Skilled Therapeutic Interventions/Progress Updates:   Session focused on appropriate interactions and social conversation with staff and family advocate, initiation, awareness, functional mobility, balance, engagement in therapeutic tasks, and activity tolerance. Patient supine in enclosure bed with family advocate present and engaged in session. Patient given 1 task to complete during session of staying out of bed for 45 minutes. Patient agreed and after 12 minutes, sat edge of bed to don socks with min cues. Throughout session, patient moving head/neck around and stating she "doesn't feel right in the head." Encouraged gentle AROM and provided hot pack at end of session for neck pain. Patient ambulated to bathroom and performed toileting tasks with min steadying assist. Patient ambulated to/from dayroom with mod HHA and consumed 50% of breakfast tray and two 4 oz honey thick liquid containers with supervision at normal pace with prolonged mastication. Patient initially wanted to shower but easily redirected to different task after being informed of OT coming for next session. Upon returning to room, patient sat in bedside chair and engaged in new learning task with focus on scanning and attention with min assist > supervision x approx 8 min before losing interest in task due to "being boring." Patient transferred chair > w/c with close supervision and propelled wheelchair using BUE/BLE with supervision x 150 ft [facility] and ambulated pushing wheelchair with min-mod A x 100 ft. Patient engaged in appropriate conversation with therapist and family advocate in sitting area and showed interest in looking out the windows at scenery before requesting to return to room.  Patient left sitting in bedside chair after politely requesting that nurse tech assist her back to bed after bringing hot pack. Patient requested to return to bed/finish session x 2 but easily redirected to new task when provided with remaining time during session in order to meet goal.   Therapy Documentation Precautions:  Precautions Precautions: Fall Restrictions Weight Bearing Restrictions: No Pain:  Unrated L neck pain, hot pack applied Locomotion : Ambulation Ambulation/Gait Assistance: 4: Min assist;3: Mod assist   See FIM for current functional status  Therapy/Group: Individual Therapy  Kerney ElbeVarner, Aishani Kalis A 01/30/2015, 9:08 AM
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
## 2015-01-31 ENCOUNTER — Inpatient Hospital Stay (HOSPITAL_COMMUNITY): Payer: Self-pay

## 2015-01-31 MED ORDER — WHITE PETROLATUM GEL
Status: AC
  Administered 2015-01-31: 10:00:00
  Filled 2015-01-31: qty 1

## 2015-01-31 NOTE — Progress Notes (Signed)
Physical Therapy Session Note  Patient Details  Name: Leah Rojas MRN: [id number] Date of Birth: 07/13/1991  Today's Date: 01/31/2015 PT Individual Time: [id number] PT Individual Time Calculation (min): 15 min   Short Term Goals: Week 3:  PT Short Term Goal 1 (Week 3): Patient will sustain attention to functional task x 10 min with max multimodal cues.  PT Short Term Goal 2 (Week 3): Patient will initiate functional mobility tasks with mod multimodal cues 50% of time.  PT Short Term Goal 3 (Week 3): Patient will perform dynamic standing balance x 3 min with min A.  PT Short Term Goal 4 (Week 3): Patient will ambulate 150 ft with consistent min A x 1.  PT Short Term Goal 5 (Week 3): Patient will consistently tolerate OOB activity x 45 min with max multimodal cues.   Skilled Therapeutic Interventions/Progress Updates:    Pt received in Posey bed. Initially pt with appropriate interactions with therapist and asked therapist to warm up lunch tray as pt had not eaten yet. Pt unwilling to get out of bed to eat, so therapist set up tray with pt sitting up in Posey bed. Pt initially with good response to cues from therapist for increasing swallowing frequency and slowing down between bites and sips. However, after ~8 minutes of eating pt began becoming frustrated with cues from therapist and she began eating unsafely with large spoonfuls and very little time between bites without time to swallow. Pt informed that if she wasn't going to listen to cues then therapist would take tray away. Pt continued to eat unsafely so therapist removed table from pt's bed. When therapist reached in to retrieve apple juice cup from pt (pt had put ice in apple juice, ice was not in contact with juice long enough to change consistency but was unsafe to remain in bed with patient) the pt became agitated and refused further participation. Pt missed 45 minutes of scheduled PT. Pt left in Posey bed with all needs  inside.  Therapy Documentation Precautions:  Precautions Precautions: Fall Restrictions Weight Bearing Restrictions: No Pain:  FACES: 0-No Pain  See FIM for current functional status  Therapy/Group: Individual Therapy  Leah Rojas, Leah Rojas  Leah SpangleJess Caileigh Canche, PT, DPT 01/31/2015, 7:58 AM
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
## 2015-02-01 ENCOUNTER — Inpatient Hospital Stay (HOSPITAL_COMMUNITY): Payer: Medicaid Other
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
## 2015-02-01 NOTE — Progress Notes (Signed)
Occupational Therapy Session Note  Patient Details  Name: Leah Rojas MRN: [id number] Date of Birth: [birth year]  Today's Date: 02/01/2015 OT Individual Time: 1100-1200 OT Individual Time Calculation (min): 60 min    Short Term Goals: Week 2:  OT Short Term Goal 1 (Week 2): Pt will tolerate 20 min OOB during therapy session with max cues to increase functional activity tolerance OT Short Term Goal 1 - Progress (Week 2): Met OT Short Term Goal 2 (Week 2): Pt will demonstrate sustained attention to functional task for 30 seconds with max cues OT Short Term Goal 2 - Progress (Week 2): Met OT Short Term Goal 3 (Week 2): Pt will verbalize 1 cognitive or physical deficit with max cues OT Short Term Goal 3 - Progress (Week 2): Met OT Short Term Goal 4 (Week 2): Pt will complete dressing with min A and min cues for initiation and completion OT Short Term Goal 4 - Progress (Week 2): Not met OT Short Term Goal 5 (Week 2): Pt will initiate 1 therapeutic activity with max cues  OT Short Term Goal 5 - Progress (Week 2): Met  Skilled Therapeutic Interventions/Progress Updates:    Pt seen for 1:1 OT session with focus on initiation, activity tolerance, functional mobility, standing balance, cognitive remediation, and core strengthening. Pt received supine in bed requiring more than reasonable amount of time to sit EOB with max cues. Pt donned socks and therapist donned shoes. Provided pt with 2 options for therapy session and pt selected with min cues. Pt completed bed making task in ADL apartment with focus on dynamic balance and attention to task. Pt completed task with min A for standing balance. Engaged in high level balance activities to strengthen core and extremities. Pt positioned in quadruped and alternating UEs from mat table 10x total. Pt completed 2 sets of activity before requesting to move onto another task secondary to fatigue. Completed 2 sets x10 reps of modified squats with UE support to  increase balance and BLE strengthening. Pt propelled self towards room then agreeable to ambulate last 20 feet with min A overall while pushing w/c. Pt politely asked for juice and green beans (dys. 2 and honey thick). Pt sat and engaged in therapeutic conversation while eating snack. Pt reporting being "excited" to see family later today for mother's day. Pt left in enclosure bed with all needs in reach.   Therapy Documentation Precautions:  Precautions Precautions: Fall Restrictions Weight Bearing Restrictions: No General:   Vital Signs:   Pain: Pt in L shoulder; heat pack applied  See FIM for current functional status  Therapy/Group: Individual Therapy  [name] 02/01/2015, 12:10 PM

## 2015-02-02 ENCOUNTER — Encounter (HOSPITAL_COMMUNITY): Payer: Self-pay

## 2015-02-02 ENCOUNTER — Inpatient Hospital Stay (HOSPITAL_COMMUNITY): Payer: Self-pay

## 2015-02-02 ENCOUNTER — Inpatient Hospital Stay (HOSPITAL_COMMUNITY): Payer: Self-pay | Admitting: Speech Pathology
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
## 2015-02-02 NOTE — Progress Notes (Signed)
Physical Therapy Session Note  Patient Details  Name: Leah ShieldsShanese Xxxclark MRN: [id number] Date of Birth: 12/12/1990  Today's Date: 02/02/2015 PT Individual Time: 0900-0955 PT Individual Time Calculation (min): 55 min   Short Term Goals: Week 3:  PT Short Term Goal 1 (Week 3): Patient will sustain attention to functional task x 10 min with max multimodal cues.  PT Short Term Goal 2 (Week 3): Patient will initiate functional mobility tasks with mod multimodal cues 50% of time.  PT Short Term Goal 3 (Week 3): Patient will perform dynamic standing balance x 3 min with min A.  PT Short Term Goal 4 (Week 3): Patient will ambulate 150 ft with consistent min A x 1.  PT Short Term Goal 5 (Week 3): Patient will consistently tolerate OOB activity x 45 min with max multimodal cues.   Skilled Therapeutic Interventions/Progress Updates:   Session focused on task initiation/completion, OOB tolerance, sustained attention, functional mobility, and standing balance. Patient resting in open enclosure bed, family advocate present for session. Patient given one goal for session to stay out of bed x 50 min. After 8 minutes, patient commanded therapist to hand her socks and shoes. Patient ambulated with mod HHA x 200 ft to ortho gym. Patient participated in 2 rounds of Uno while seated on mat with therapist and family advocate. Patient required initial min cues to adhere to rules of game but then was able to explain rules to family advocate with supervision. Patient initiated performing simulated car transfer for family advocate x 1 to sedan height and x 1 to SUV height, overall min A. During seated rest break, patient agreeable to trialling RW to improve independence with functional ambulation. Gait training using RW 3 x 200 ft with supervision-min A overall, patient with progressively increased speed and decreased safety with increased fatigue and decreased frustration tolerance to tasks. Patient given task to complete NuStep  x 10 min for timing and sequencing, NMR, and endurance. Patient performed NuStep using BUE/BLE x 4 min in moderately distracting environment before stating she was done and unwilling to participate further. While ambulating back to room, patient yelled at therapist, "You work me too hard. You work me like a Tourist information centre managerdog." Education provided regarding purpose of therapies and goals in order to return home safely. Patient returned to bed and told therapist to "get out," then refused to verbally respond to therapist at end of session. Patient gave therapist the peace sign after saying bye. Patient tolerated OOB activity x 45 min. Patient left sidelying in bed with advocate in room and needs within reach.   Therapy Documentation Precautions:  Precautions Precautions: Fall Restrictions Weight Bearing Restrictions: No General: PT Amount of Missed Time (min): 5 Minutes PT Missed Treatment Reason: Patient unwilling to participate Pain: Pain Assessment Pain Assessment: Faces Faces Pain Scale: No hurt Locomotion : Ambulation Ambulation/Gait Assistance: 4: Min assist;5: Supervision;3: Mod assist   See FIM for current functional status  Therapy/Group: Individual Therapy  Kerney ElbeVarner, Kamin Niblack A 02/02/2015, 9:58 AM

## 2015-02-02 NOTE — Progress Notes (Signed)
Physical Therapy Session Note  Patient Details  Name: Leah Rojas MRN: [id number] Date of Birth: 04/30/1991  Today's Date: 02/02/2015 PT Individual Time: 1300-1330 PT Individual Time Calculation (min): 30 min   Short Term Goals: Week 3:  PT Short Term Goal 1 (Week 3): Patient will sustain attention to functional task x 10 min with max multimodal cues.  PT Short Term Goal 2 (Week 3): Patient will initiate functional mobility tasks with mod multimodal cues 50% of time.  PT Short Term Goal 3 (Week 3): Patient will perform dynamic standing balance x 3 min with min A.  PT Short Term Goal 4 (Week 3): Patient will ambulate 150 ft with consistent min A x 1.  PT Short Term Goal 5 (Week 3): Patient will consistently tolerate OOB activity x 45 min with max multimodal cues.   Skilled Therapeutic Interventions/Progress Updates:    Session focused on sustained attention, problem solving, dynamic balance, functional mobility. Pt initially verbally combative to therapist and refusing to open eyes or pull covers off of head. With assistance from SLP [facility]Courtney pt agreed to get up with this therapist to complete 2 functional tasks (move clothes from washer to dryer, ascend/descend a flight of stairs). Pt ambulated around rehab unit w/ RW and close S w/ 2x ModA to correct LOB, pt's gait characterized by staggering and losses of balance to L. Pt could recall stair task but required cueing to recall laundry task. While negotiating stairs pt used 1 rail ascending and 2 rails descending, completed task with no physical assist but max cueing for attending to task. On arrival to laundry room found that pt's clothes had been moved to dryer but were still damp. Pt able to verbalize correct procedure to continue drying clothes. Session ended in pt's room, where pt was left secured in Posey bed w/ all needs within reach.    Therapy Documentation Precautions:  Precautions Precautions: Fall Restrictions Weight Bearing  Restrictions: No Pain: Pain Assessment Pain Assessment: No/denies pain Pain Score: 0-No pain  See FIM for current functional status  Therapy/Group: Individual Therapy  Hosie SpangleGodfrey, Leah Rojas  Hosie SpangleJess Divit Stipp, PT, DPT 02/02/2015, 7:34 AM

## 2015-02-02 NOTE — Progress Notes (Signed)
Occupational Therapy Session Note  Patient Details  Name: Leah ShieldsShanese Xxxclark MRN: [id number] Date of Birth: 12/05/1990  Today's Date: 02/02/2015 OT Individual Time: [id number] and [id number] OT Individual Time Calculation (min): 46 min  and Today's Date: 02/02/2015 OT Missed Time: 14 Minutes and 13 min Missed Time Reason: Patient fatigue; refusal    Short Term Goals: Week 3:  OT Short Term Goal 1 (Week 3): Pt will tolerate 30 min OOB during therapy session with mod cues to increase functional activity tolerance OT Short Term Goal 2 (Week 3): Pt will initiate 1 therapeutic activity (other than ADL) with max cues OT Short Term Goal 3 (Week 3): Pt will sustain attention to functional task for 3 min with max cues OT Short Term Goal 4 (Week 3): Pt will stand for 2 min during functional activity with min A for dynamic standing balance  Skilled Therapeutic Interventions/Progress Updates:    Session 1: Pt seen for ADL retraining with focus on OOB tolerance, task initiation/completion, functional mobility, standing balance, and awareness. Pt received supine in bed requiring increased time and max cues for initiating sitting EOB. Pt verbalized wanting to shower and eat during therapy session. Therapist agreeable and informed pt of goal to remain OOB for 60 min therapy session. Pt ambulated to bathroom at min HHA. Pt completed toileting and bathing at shower level with mod cues for sequencing during bathing. Pt returned to room with min A functional mobility and min-mod A with turn changes. Completed dressing with frequent rest breaks due to fatigue. Pt ate breakfast with mod cues for small bite size. Pt recalled needing RN to assist with applying cream to underarms. Pt interacted appropriately with RN upon entry. Pt encouraged to place clothes in laundry as final task of therapy session however refusing stating, "I need to rest before my next therapy." Allowed pt 15 min rest in bed in preparation for next therapy  session. Enclosure bed left unzipped and family friend present. Pt educated on use of call light to assist with needs.   Session 2: Pt seen for 1:1 OT session with focus on active participation, cognitive remediation, functional transfers, and standing balance. Pt received supine in bed initiating retrieving laundry and folding clothes. Pt declined ambulation therefore propelled self in w/c using BLEs. Pt recalled location of laundry room and retrieved clothing in standing with SBA for balance. Pt returned to room and declined folding laundry in standing therefore completed from w/c level then placed in drawers. Pt returned to bed and declined further participation. Therapist encouraging pt to participate, however unsuccessful as she began cursing at therapist. Allowed pt rest break then pt proceeded to sit EOB raising voice at therapist stating "why are whining" and continued cursing. Informed pt that she was acting inappropriate and her actions were not acceptable. Pt retrieved phone and called mother asking therapist to leave. Mother provided encouragement and pt willing to get OOB to complete initial task therapist instructed was goal of session. Pt again declined ambulation therefore provided total A via w/c room>ADL kitchen. Pt completed pudding making task verbalizing all ingredients and retrieving 50% of them with increased time and min A for functional mobility in kitchen. Pt followed directions on package with min cues. Upon completion of task, pt initiated clean-up. Pt asking to let pudding set overnight and she would check on it in the morning. Pt returned to room and initiated going back to bed declining further participation. Pt left in opened enclosure bed.   Therapy Documentation Precautions:  Precautions Precautions: Fall Restrictions Weight Bearing Restrictions: No General: General OT Amount of Missed Time: 14 Minutes Vital Signs: Therapy Vitals Temp: 98.4 F (36.9 C) Temp Source:  Oral Pulse Rate: 81 Resp: 18 BP: (!) 91/49 mmHg Patient Position (if appropriate): Lying Oxygen Therapy SpO2: 95 % O2 Device: Not Delivered Pain: Pt reports pain on L side of neck; muscle rub applied  Other Treatments:    See FIM for current functional status  Therapy/Group: Individual Therapy  Daneil Danerkinson, Tiandre Teall N 02/02/2015, 8:50 AM

## 2015-02-02 NOTE — Progress Notes (Signed)
Speech Language Pathology Daily Session Note  Patient Details  Name: Leah Rojas MRN: [id number] Date of Birth: 04/02/1991  Today's Date: 02/02/2015 SLP Individual Time: [id number] SLP Individual Time Calculation (min): 60 min  Short Term Goals: Week 3: SLP Short Term Goal 1 (Week 3): Patient will consume current diet with minimal overt s/s of aspiration with Mod A multimodal cues for use of swallow strategies.  SLP Short Term Goal 1 - Progress (Week 3): Progressing toward goal SLP Short Term Goal 2 (Week 3): Patient will consume trials of thin liquids via teaspoon with minimal overt s/s of aspiration with Mod A multimodal cues for use of swallow strategies.  SLP Short Term Goal 2 - Progress (Week 3): Progressing toward goal SLP Short Term Goal 3 (Week 3): Patient will demonstrate sustained attention to functional tasks for 5 minutes with mod A multimodal cues.  SLP Short Term Goal 3 - Progress (Week 3): Progressing toward goal SLP Short Term Goal 4 (Week 3): Patient will engage/particiapte in 2 functional tasks per 30 minute session with Mod A multimodal cues.  SLP Short Term Goal 4 - Progress (Week 3): Progressing toward goal SLP Short Term Goal 5 (Week 3): Patient will improve safety awareness by allowing staff to assist her during functional tasks with Max A multimodal cues.  SLP Short Term Goal 5 - Progress (Week 3): Progressing toward goal SLP Short Term Goal 6 (Week 3): Patient will increase speech intelligibility at the phrase level with Mod A verbal cues.  SLP Short Term Goal 6 - Progress (Week 3): Progressing toward goal  Skilled Therapeutic Interventions: Skilled ST session focused on swallowing and cognitive goals. SLP facilitated session by providing trials up advanced consistencies, including graham cracker with peanut butter, and Sprite. Pt demonstrated appropriate mastication of multi-consistency solid, and did not need cues for slow rate. Pt did require cues for small sips  and slow rate with thin liquid. No overt s/s aspiration observed during trial of thin liquid. RN present during med administration, and pt tolerated meds whole in puree, given 1 at a time. Safe swallow precautions updated in pt room. Pt was noted to have improved voice quality since last seen by this SLP, with less hoarseness and increased loudness. Pt completed 3 trail-making activities of increasing complexity, targeting sustained attention and functional problem solving with supervision verbal cues. Pt independently recalled clothes in the laundry, however, thought they were in the dryer when they were actually still in the washing machine. Significant improvement in pt participation in therapy session today as compared to previous sessions. Repeat MBS scheduled for tomorrow (02/03/15) to determine readiness to advance to Regular solids and thin liquids.    FIM:  Comprehension Comprehension Mode: Auditory Comprehension: 5-Understands basic 90% of the time/requires cueing < 10% of the time Expression Expression Mode: Verbal Expression: 4-Expresses basic 75 - 89% of the time/requires cueing 10 - 24% of the time. Needs helper to occlude trach/needs to repeat words. Social Interaction Social Interaction: 4-Interacts appropriately 75 - 89% of the time - Needs redirection for appropriate language or to initiate interaction. Problem Solving Problem Solving: 4-Solves basic 75 - 89% of the time/requires cueing 10 - 24% of the time Memory Memory: 4-Recognizes or recalls 75 - 89% of the time/requires cueing 10 - 24% of the time  Pain Pain Assessment Pain Assessment: No/denies pain Faces Pain Scale: No hurt  Therapy/Group: Individual Therapy   Treyshaun Keatts B. AuburnBueche, [facility]pmc LititzMSP, CCC-SLP [phone]  Leigh AuroraBueche, Ugo[place]ukwu Chichester Brown 02/02/2015, 11:37 AM

## 2015-02-03 ENCOUNTER — Inpatient Hospital Stay (HOSPITAL_COMMUNITY): Payer: Self-pay | Admitting: Occupational Therapy

## 2015-02-03 ENCOUNTER — Inpatient Hospital Stay (HOSPITAL_COMMUNITY): Payer: Medicaid Other | Admitting: Speech Pathology

## 2015-02-03 ENCOUNTER — Inpatient Hospital Stay (HOSPITAL_COMMUNITY): Payer: Medicaid Other

## 2015-02-03 ENCOUNTER — Inpatient Hospital Stay (HOSPITAL_COMMUNITY): Payer: Self-pay

## 2015-02-03 ENCOUNTER — Inpatient Hospital Stay (HOSPITAL_COMMUNITY): Payer: Medicaid Other | Admitting: Physical Therapy

## 2015-02-03 MED ORDER — IBUPROFEN 200 MG PO TABS
400.0000 mg | ORAL_TABLET | Freq: Three times a day (TID) | ORAL | Status: DC | PRN
  Administered 2015-02-03 – 2015-02-10 (×3): 400 mg via ORAL
  Filled 2015-02-03 (×3): qty 2

## 2015-02-03 MED ORDER — PROCHLORPERAZINE EDISYLATE 5 MG/ML IJ SOLN: 5.0000 mg | Freq: Four times a day (QID) | INTRAMUSCULAR | Status: DC | PRN

## 2015-02-03 NOTE — Progress Notes (Signed)
Speech Language Pathology Note  Patient Details  Name: Leah ShieldsShanese Xxxclark MRN: [id number] Date of Birth: 06/23/1991 Today's Date: 02/03/2015  MBSS complete. Full report located under chart review in imaging section.    Shonette Rhames 02/03/2015, 4:03 PM

## 2015-02-03 NOTE — Progress Notes (Signed)
Physical Therapy Session Note  Patient Details  Name: Leah Rojas MRN: [id number] Date of Birth: 06/21/1991  Today's Date: 02/03/2015 PT Individual Time: 1000-1100 PT Individual Time Calculation (min): 60 min   Short Term Goals: Week 3:  PT Short Term Goal 1 (Week 3): Patient will sustain attention to functional task x 10 min with max multimodal cues.  PT Short Term Goal 2 (Week 3): Patient will initiate functional mobility tasks with mod multimodal cues 50% of time.  PT Short Term Goal 3 (Week 3): Patient will perform dynamic standing balance x 3 min with min A.  PT Short Term Goal 4 (Week 3): Patient will ambulate 150 ft with consistent min A x 1.  PT Short Term Goal 5 (Week 3): Patient will consistently tolerate OOB activity x 45 min with max multimodal cues.   Skilled Therapeutic Interventions/Progress Updates:   Session focused on NMR, dynamic standing balance, activity tolerance, and cognitive remediation. Patient in wheelchair in day room following SLP session, stating she wanted to work on her "brain." Patient in positive, joking mood following MBS and able to correctly report diet upgrades. However, patient c/o feeling nauseated due to barium swallow and dry heaved several times at beginning of session. Patient ambulated from day room to therapy gym without AD and min-mod A overall after declining use of RW. [facility] and provided frequent seated rest breaks in order to improve tolerance to challenging balance tasks. Patient demonstrates increased fall risk as noted by score of 25/56 on Berg Balance Scale (<36= high risk for falls, close to 100%; 37-45 significant >80%; 46-51 moderate >50%; 52-55 lower >25%). Patient instructed in cognitive TUG without AD but was unable to count backwards by 3's from 100 while seated on mat as initially instructed, then was unable to count backwards from 20 by 2's until family advocate provided patient with calculator.  Patient continuously counted up by 1's from any number given. Patient attempted to complete task by propelling wheelchair around cone instead of ambulating. Engaged in therapeutic conversation for active rest break with patient and family advocate with focus on family, patient's past/current relationships, and patient's prior job duties. Patient self elected to complete NuStep using BUE/BLE at level 5 for "5 minutes only, not 10" as she had been previously unable to tolerate 10 min during yesterday's session. Family advocate also performed NuStep on machine next to patient in order to provide encouragement. Patient stated she would "not walk back" to room and wanted to ride in wheelchair but was easily redirected with min cues to ambulate back to room pushing wheelchair with min-mod A. Patient jokingly exaggerated c/o pain in order to "get out of walking" but was smiling and continued to ambulate with same level of assist. Patient left sitting edge of bed, family advocate present. Patient tolerated OOB activity x 60 min this date.   Therapy Documentation Precautions:  Precautions Precautions: Fall Restrictions Weight Bearing Restrictions: No Pain:  Unrated L shoulder/back pain, hot pack applied Locomotion : Ambulation Ambulation/Gait Assistance: 4: Min assist;5: Supervision;3: Mod assist  Balance: Balance Balance Assessed: Yes Standardized Balance Assessment Standardized Balance Assessment: Berg Balance Test Berg Balance Test Sit to Stand: Able to stand without using hands and stabilize independently Standing Unsupported: Able to stand 2 minutes with supervision Sitting with Back Unsupported but Feet Supported on Floor or Stool: Able to sit safely and securely 2 minutes Stand to Sit: Sits independently, has uncontrolled descent Transfers: Able to transfer with verbal cueing and /  or supervision Standing Unsupported with Eyes Closed: Able to stand 10 seconds with supervision Standing  Ubsupported with Feet Together: Able to place feet together independently but unable to hold for 30 seconds From Standing, Reach Forward with Outstretched Arm: Reaches forward but needs supervision From Standing Position, Pick up Object from Floor: Able to pick up shoe, needs supervision From Standing Position, Turn to Look Behind Over each Shoulder: Needs supervision when turning Turn 360 Degrees: Needs assistance while turning Standing Unsupported, Alternately Place Feet on Step/Stool: Able to complete >2 steps/needs minimal assist Standing Unsupported, One Foot in Front: Loses balance while stepping or standing Standing on One Leg: Unable to try or needs assist to prevent fall Total Score: 25/56  See FIM for current functional status  Therapy/Group: Individual Therapy  Kerney ElbeVarner, Shea Kapur A 02/03/2015, 12:43 PM
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
## 2015-02-03 NOTE — Progress Notes (Signed)
Physical Therapy Note  Patient Details  Name: Leah ShieldsShanese Rojas MRN: [id number] Date of Birth: 12/24/1990 Today's Date: 02/03/2015    Attempted to see pt for treatment on this date. Pt continually unable to open eyes and engage verbally with therapist. Pt would open eyes for brief (<5 second) period with tactile cueing only (did not respond to verbal cues). Therapist described activities set up in gym to attempt goal oriented rehab, however pt continually refused to get out of bed. Pt missed 60 minutes scheduled PT.  Hosie SpangleGodfrey, Pa Tennant 02/03/2015, 4:40 PM

## 2015-02-03 NOTE — Progress Notes (Signed)
Speech Language Pathology Daily Session Note  Patient Details  Name: Leah Rojas MRN: [id number] Date of Birth: 02/06/1991  Today's Date: 02/03/2015 SLP Individual Time: [id number] SLP Individual Time Calculation (min): 25 min  Short Term Goals: Week 3: SLP Short Term Goal 1 (Week 3): Patient will consume current diet with minimal overt s/s of aspiration with Mod A multimodal cues for use of swallow strategies.  SLP Short Term Goal 1 - Progress (Week 3): Progressing toward goal SLP Short Term Goal 2 (Week 3): Patient will consume trials of thin liquids via teaspoon with minimal overt s/s of aspiration with Mod A multimodal cues for use of swallow strategies.  SLP Short Term Goal 2 - Progress (Week 3): Progressing toward goal SLP Short Term Goal 3 (Week 3): Patient will demonstrate sustained attention to functional tasks for 5 minutes with mod A multimodal cues.  SLP Short Term Goal 3 - Progress (Week 3): Progressing toward goal SLP Short Term Goal 4 (Week 3): Patient will engage/particiapte in 2 functional tasks per 30 minute session with Mod A multimodal cues.  SLP Short Term Goal 4 - Progress (Week 3): Progressing toward goal SLP Short Term Goal 5 (Week 3): Patient will improve safety awareness by allowing staff to assist her during functional tasks with Max A multimodal cues.  SLP Short Term Goal 5 - Progress (Week 3): Progressing toward goal SLP Short Term Goal 6 (Week 3): Patient will increase speech intelligibility at the phrase level with Mod A verbal cues.  SLP Short Term Goal 6 - Progress (Week 3): Progressing toward goal  Skilled Therapeutic Interventions: Skilled treatment session focused on dysphagia goals. SLP facilitated session by providing skilled observation with breakfast meal of Dys. 2 textures with thin liquids via cup. Patient consumed meal without overt s/s of aspiration and required supervision verbal cues for use of swallowing compensatory strategies. Patient also  demonstrated increased intellectual awareness of deficits and reported that she "would stay one more week to work on her eyes and brain."  Patient handed off to PT. Continue with current plan of care.    FIM:  Comprehension Comprehension Mode: Auditory Comprehension: 5-Understands basic 90% of the time/requires cueing < 10% of the time Expression Expression Mode: Verbal Expression: 3-Expresses basic 50 - 74% of the time/requires cueing 25 - 50% of the time. Needs to repeat parts of sentences. Social Interaction Social Interaction: 4-Interacts appropriately 75 - 89% of the time - Needs redirection for appropriate language or to initiate interaction. Problem Solving Problem Solving: 4-Solves basic 75 - 89% of the time/requires cueing 10 - 24% of the time Memory Memory: 4-Recognizes or recalls 75 - 89% of the time/requires cueing 10 - 24% of the time FIM - Eating Eating Activity: 5: Supervision/cues  Pain Pain Assessment Pain Assessment: No/denies pain  Therapy/Group: Individual Therapy  Leah Rojas 02/03/2015, 2:34 PM

## 2015-02-03 NOTE — Progress Notes (Signed)
Occupational Therapy Session Note  Patient Details  Name: Leah ShieldsShanese Xxxclark MRN: [id number] Date of Birth: 01/13/1991  Today's Date: 02/03/2015 OT Individual Time: [id number] OT Individual Time Calculation (min): 55 min    Short Term Goals: Week 3:  OT Short Term Goal 1 (Week 3): Pt will tolerate 30 min OOB during therapy session with mod cues to increase functional activity tolerance OT Short Term Goal 2 (Week 3): Pt will initiate 1 therapeutic activity (other than ADL) with max cues OT Short Term Goal 3 (Week 3): Pt will sustain attention to functional task for 3 min with max cues OT Short Term Goal 4 (Week 3): Pt will stand for 2 min during functional activity with min A for dynamic standing balance  Skilled Therapeutic Interventions/Progress Updates:    Pt seen for 1:1 OT session with focus on functional mobility, transfers, active participation, initiation, and awareness. Pt received supine in bed reporting she already showered and dressed. Pt required more than reasonable amount of time (25 min) to get OOB and participate in functional activity with max cues. Pt ambulated bed>bathroom with SBA using RW. Pt with slight LOB during turn change requiring min A to correct. Pt completed toileting at supervision level and informing therapist when she needed to stand. Pt stood at sink approx 5 min to complete grooming tasks with no cues for attention. Pt returned to bed and asking for cream under arm. Informed pt that RN had cream and she could ask her. Pt required min cues for problem solving to utilize call light and max cues for appropriately asking for needs. Pt ambulated room>ADL apartment at CGA-SBA for balance using RW. Practiced tub transfer using TTB and pt demonstrating improved awareness as she stated "I think I need this at home." Pt returned to room and family provided payroll information from work. Pt able to read 5 names without difficulty. Pt requesting rest break before next therapy  therefore left sitting in w/c in preparation for MBS. Therapist educated family on TTB, approx cost, etc.   Therapy Documentation Precautions:  Precautions Precautions: Fall Restrictions Weight Bearing Restrictions: No General: General OT Amount of Missed Time: 5 Minutes Vital Signs: Therapy Vitals Temp: 98.6 F (37 C) Temp Source: Oral Pulse Rate: 79 Resp: 18 BP: 116/77 mmHg Patient Position (if appropriate): Lying Oxygen Therapy SpO2: 100 % O2 Device: Not Delivered Pain:   ADL:   Exercises:   Other Treatments:    See FIM for current functional status  Therapy/Group: Individual Therapy  Dyneshia Baccam, Vara GuardianKayla N 02/03/2015, 9:02 AM

## 2015-02-03 NOTE — Progress Notes (Signed)
Occupational Therapy Session Note  Patient Details  Name: Leah Rojas MRN: [id number] Date of Birth: 02/15/1991  Today's Date: 02/03/2015 OT Individual Time: 1300-1355 OT Individual Time Calculation (min): 55 min    Short Term Goals: Week 1:  OT Short Term Goal 1 (Week 1): Pt will initiate 1 self care task with max cues OT Short Term Goal 1 - Progress (Week 1): Met OT Short Term Goal 2 (Week 1): Pt will complete bathing with mod assist and max cues for sequencing OT Short Term Goal 2 - Progress (Week 1): Met OT Short Term Goal 3 (Week 1): Pt will stand for 30 seconds during self-care task with min assist OT Short Term Goal 3 - Progress (Week 1): Met OT Short Term Goal 4 (Week 1): Pt will sustain attention to functional task for 30 seconds with max cues OT Short Term Goal 4 - Progress (Week 1): Not met OT Short Term Goal 5 (Week 1): Pt will follow one-step commands during self-care task 75% of time with max cues OT Short Term Goal 5 - Progress (Week 1): Not met Week 2:  OT Short Term Goal 1 (Week 2): Pt will tolerate 20 min OOB during therapy session with max cues to increase functional activity tolerance OT Short Term Goal 1 - Progress (Week 2): Met OT Short Term Goal 2 (Week 2): Pt will demonstrate sustained attention to functional task for 30 seconds with max cues OT Short Term Goal 2 - Progress (Week 2): Met OT Short Term Goal 3 (Week 2): Pt will verbalize 1 cognitive or physical deficit with max cues OT Short Term Goal 3 - Progress (Week 2): Met OT Short Term Goal 4 (Week 2): Pt will complete dressing with min A and min cues for initiation and completion OT Short Term Goal 4 - Progress (Week 2): Not met OT Short Term Goal 5 (Week 2): Pt will initiate 1 therapeutic activity with max cues  OT Short Term Goal 5 - Progress (Week 2): Met  Skilled Therapeutic Interventions/Progress Updates:    1:1 Pt will and ready to participate in therapy session this pm. Pt allowed therapist to  assess more of vision. Pt present with diplopia to the right of midline in near field. Pt tolerated a few exercises before fatiguing. I wouldn't recommend an eye patch at this time- Pt able to tolerate functional mobility without too many complaints. Don't want pt to become depend on it and is currently willing to work on it.  Pt assisted with coming up with activities to work on in therapy and what she still needs to work on before d/c.  Pt participated with functional ambulation around room with min A with VC for speed while picking out clothing tomorrow from [employer]. Pt opted to use RW to navigate in the hallway to the gym.  Pt able to recall where the gym is but declined to go inside due to noise. Sat outside of gym. Participated in navigating through an obstacle course. Pt able to recall directions throughout the course and able to demonstrate ability to self regulate speed for safety. Pt did great recalling directions (2-3 steps directions) with extra time. Return to room to bed. Family education with cousin Leah Rojas on noise and light sensitivity and limiting number of visitors at one time.   Therapy Documentation Precautions:  Precautions Precautions: Fall Restrictions Weight Bearing Restrictions: No General: General OT Amount of Missed Time: 5 Minutes    Pain: Reports pain in head and left side  neck- declined pain meds but agreed to heat at end of session  See FIM for current functional status  Therapy/Group: Individual Therapy  [facility] 02/03/2015, 3:15 PM

## 2015-02-04 ENCOUNTER — Inpatient Hospital Stay (HOSPITAL_COMMUNITY): Payer: Medicaid Other | Admitting: Speech Pathology

## 2015-02-04 ENCOUNTER — Inpatient Hospital Stay (HOSPITAL_COMMUNITY): Payer: Medicaid Other

## 2015-02-04 ENCOUNTER — Inpatient Hospital Stay (HOSPITAL_COMMUNITY): Payer: Medicaid Other | Admitting: Physical Therapy

## 2015-02-04 ENCOUNTER — Inpatient Hospital Stay (HOSPITAL_COMMUNITY): Payer: Self-pay

## 2015-02-04 LAB — BASIC METABOLIC PANEL
ANION GAP: 8 (ref 5–15)
BUN: 11 mg/dL (ref 6–20)
CO2: 22 mmol/L (ref 22–32)
Calcium: 8.4 mg/dL — ABNORMAL LOW (ref 8.9–10.3)
Chloride: 109 mmol/L (ref 101–111)
Creatinine, Ser: 0.42 mg/dL — ABNORMAL LOW (ref 0.44–1.00)
GFR calc Af Amer: >60 mL/min (ref >60)
GLUCOSE: 80 mg/dL (ref 70–99)
Potassium: 4 mmol/L (ref 3.5–5.1)
SODIUM: 139 mmol/L (ref 135–145)

## 2015-02-04 MED ORDER — QUETIAPINE FUMARATE 50 MG PO TABS
50.0000 mg | ORAL_TABLET | Freq: Every day | ORAL | Status: DC
  Administered 2015-02-04 – 2015-02-08 (×4): 50 mg via ORAL
  Filled 2015-02-04 (×6): qty 1

## 2015-02-04 MED ORDER — QUETIAPINE FUMARATE 25 MG PO TABS
25.0000 mg | ORAL_TABLET | Freq: Every day | ORAL | Status: DC
  Administered 2015-02-04: 25 mg via ORAL
  Filled 2015-02-04 (×3): qty 1

## 2015-02-04 NOTE — Progress Notes (Addendum)
Physical Therapy Weekly Progress Note  Patient Details  Name: Leah Rojas MRN: [id number] Date of Birth: 28-Oct-1990  Beginning of progress report period: Jan 29, 2015 End of progress report period: Feb 04, 2015  Today's Date: 02/04/2015 PT Individual Time: 1100-1130 PT Individual Time Calculation (min): 30 min   Patient has met 3 of 5 short term goals.  Patient has made steady progress this reporting period due to improved attention, improved initiation, improved awareness, and overall increased participation. Patient is demonstrating behaviors consistent with [place]. Patient with consistently increased engagement in therapeutic tasks with mod-max cues for encouragement and has tolerated up to 60 min OOB. Patient currently requires supervision for bed mobility, min A for transfers and stairs using rails, and supervision-mod A for ambulation and standing balance. Patient has transitioned out of enclosure bed to regular hospital bed and patient's length of stay has been extended in order to improve functional independence in preparation for discharge home with recommendation of 24/7 assistance vs supervision.   Patient continues to demonstrate the following deficits: ataxic gait, muscle weakness, decreased endurance, decreased OOB tolerance, decreased standing balance and balance strategies, decreased attention, decreased emergent awareness, decreased safety awareness, decreased problem solving, decreased initiation, decreased coordination, and vision impairments and therefore will continue to benefit from skilled PT intervention to enhance overall performance with activity tolerance, balance, postural control, ability to compensate for deficits, functional use of  right upper extremity, right lower extremity, left upper extremity and left lower extremity, attention, awareness and coordination.  Patient progressing toward long term goals.  Continue plan of care.   PT Short Term Goals Week  3:  PT Short Term Goal 1 (Week 3): Patient will sustain attention to functional task x 10 min with max multimodal cues.  PT Short Term Goal 1 - Progress (Week 3): Met PT Short Term Goal 2 (Week 3): Patient will initiate functional mobility tasks with mod multimodal cues 50% of time.  PT Short Term Goal 2 - Progress (Week 3): Met PT Short Term Goal 3 (Week 3): Patient will perform dynamic standing balance x 3 min with min A.  PT Short Term Goal 3 - Progress (Week 3): Progressing toward goal PT Short Term Goal 4 (Week 3): Patient will ambulate 150 ft with consistent min A x 1.  PT Short Term Goal 4 - Progress (Week 3): Progressing toward goal PT Short Term Goal 5 (Week 3): Patient will consistently tolerate OOB activity x 45 min with max multimodal cues.  PT Short Term Goal 5 - Progress (Week 3): Met Week 4:  PT Short Term Goal 1 (Week 4): = LTGs of overall supervision  Skilled Therapeutic Interventions/Progress Updates:  Session focused on active participation and activity tolerance. Patient supine in bed, reporting "not feeling good" this AM. Patient unable to elaborate in order for therapist to assist patient. Provided max encouragement to participate in bed level exercises or sit edge of bed to participate in therapy. Patient repeatedly declined and called this therapist inappropriate names. When therapist reprimanded patient for socially inappropriate behavior, patient laughed.  Upon returning to room with RN and card game, patient eventually sat up in bed after cards were dealt and told therapist to pick up cards. Patient required mod verbal cues for adherence to game rules, strategy, and turn taking, most likely due to decreased motivation leading to decreased sustained attention to task. After approximately 10 min, patient put cards down and laid back down in bed, telling RN and therapist to "get  out." RN and therapist provided education regarding consequences of not participating in therapy,  continue to reinforce. Patient left supine in bed with bed alarm on and call bell within reach. Attempted to f/u in PM with patient apologizing for calling therapist names earlier but continued to state, "I don't want to do anything." Encouraged patient to participate in therapy tomorrow. Patient replied, "maybe I will, maybe I won't." Patient left semi reclined in bed with mom present.   Therapy Documentation Precautions:  Precautions Precautions: Fall Restrictions Weight Bearing Restrictions: No General: PT Amount of Missed Time (min): 30 Minutes PT Missed Treatment Reason: Patient unwilling to participate Pain: Pain Assessment Pain Assessment: No/denies pain Pain Score: 0-No pain  See FIM for current functional status  Therapy/Group: Individual Therapy  Laretta Alstrom 02/04/2015, 11:38 AM

## 2015-02-04 NOTE — Progress Notes (Signed)
Speech Language Pathology Daily Session Note  Patient Details  Name: Leah ShieldsShanese Xxxclark MRN: [id number] Date of Birth: 10/27/1990  Today's Date: 02/04/2015 SLP Individual Time: 1300-1330 SLP Individual Time Calculation (min): 30 min  Short Term Goals: Week 3: SLP Short Term Goal 1 (Week 3): Patient will consume current diet with minimal overt s/s of aspiration with Mod A multimodal cues for use of swallow strategies.  SLP Short Term Goal 1 - Progress (Week 3): Progressing toward goal SLP Short Term Goal 2 (Week 3): Patient will consume trials of thin liquids via teaspoon with minimal overt s/s of aspiration with Mod A multimodal cues for use of swallow strategies.  SLP Short Term Goal 2 - Progress (Week 3): Progressing toward goal SLP Short Term Goal 3 (Week 3): Patient will demonstrate sustained attention to functional tasks for 5 minutes with mod A multimodal cues.  SLP Short Term Goal 3 - Progress (Week 3): Progressing toward goal SLP Short Term Goal 4 (Week 3): Patient will engage/particiapte in 2 functional tasks per 30 minute session with Mod A multimodal cues.  SLP Short Term Goal 4 - Progress (Week 3): Progressing toward goal SLP Short Term Goal 5 (Week 3): Patient will improve safety awareness by allowing staff to assist her during functional tasks with Max A multimodal cues.  SLP Short Term Goal 5 - Progress (Week 3): Progressing toward goal SLP Short Term Goal 6 (Week 3): Patient will increase speech intelligibility at the phrase level with Mod A verbal cues.  SLP Short Term Goal 6 - Progress (Week 3): Progressing toward goal  Skilled Therapeutic Interventions: Skilled treatment session focused on cognitive goals. Upon arrival, patient was supine in bed and reported she did not feel well.  SLP facilitated session by providing Max encouragement for patient to sit EOB to consume minimal trials of thin liquids via cup. Patient consumed liquids without overt s/s of aspiration and utilized  small sips with Mod I.  Patient also minimally participated in a functional written expression task with focus on intellectual and emergent awareness of behavior. Patient left supine in bed with alarm on and all needs within reach.    FIM:  Comprehension Comprehension Mode: Auditory Comprehension: 5-Understands basic 90% of the time/requires cueing < 10% of the time Expression Expression Mode: Verbal Expression: 3-Expresses basic 50 - 74% of the time/requires cueing 25 - 50% of the time. Needs to repeat parts of sentences. Social Interaction Social Interaction: 4-Interacts appropriately 75 - 89% of the time - Needs redirection for appropriate language or to initiate interaction. Problem Solving Problem Solving: 3-Solves basic 50 - 74% of the time/requires cueing 25 - 49% of the time Memory Memory: 4-Recognizes or recalls 75 - 89% of the time/requires cueing 10 - 24% of the time  Pain Pain Assessment Pain Assessment: No/denies pain  Therapy/Group: Individual Therapy  Yaphet Smethurst 02/04/2015, 3:48 PM
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
## 2015-02-04 NOTE — Progress Notes (Signed)
Physical Therapy Session Note  Patient Details  Name: Leah Rojas MRN: [id number] Date of Birth: 03-27-1991  Today's Date: 02/04/2015 Canceled Session due to Patient Refusal to Participate.  Missed Minutes: 45 minutes  Short Term Goals: Week 3:  PT Short Term Goal 1 (Week 3): Patient will sustain attention to functional task x 10 min with max multimodal cues.  PT Short Term Goal 1 - Progress (Week 3): Met PT Short Term Goal 2 (Week 3): Patient will initiate functional mobility tasks with mod multimodal cues 50% of time.  PT Short Term Goal 2 - Progress (Week 3): Met PT Short Term Goal 3 (Week 3): Patient will perform dynamic standing balance x 3 min with min A.  PT Short Term Goal 3 - Progress (Week 3): Progressing toward goal PT Short Term Goal 4 (Week 3): Patient will ambulate 150 ft with consistent min A x 1.  PT Short Term Goal 4 - Progress (Week 3): Progressing toward goal PT Short Term Goal 5 (Week 3): Patient will consistently tolerate OOB activity x 45 min with max multimodal cues.  PT Short Term Goal 5 - Progress (Week 3): Met  PT entered room and pt repeatedly refused to participate with PT, and ended up telephoning mother and calling RN through call button at the same time to complain that PT was attempting to get pt to participate. Pt ended up stopping communication with PT, so no skilled therapy complete this session.   Therapy/Group: Individual Therapy  [name] 02/04/2015, 8:56 AM

## 2015-02-04 NOTE — Progress Notes (Signed)
Occupational Therapy Weekly Progress Note  Patient Details  Name: Leah Rojas MRN: [id number] Date of Birth: [birth year]  Beginning of progress report period: Jan 29, 2015 End of progress report period: Feb 04, 2015  Today's Date: 02/04/2015 OT Individual Time: 9381-0175 and 1025-8527 OT Individual Time Calculation (min): 40 min  And 25 min and Today's Date: 02/04/2015 OT Missed Time: 20 Minutes and 35 min  Missed Time Reason: Patient ill (comment);Patient unwilling/refused to participate without medical reason   Patient has met 3 of 4 short term goals. Patient has made good progress during this reporting period. Patient has consistently demonstrated improved active participation in therapy sessions with mod-max cues of encouragement. Patient requires min-mod assist functional mobility with and without use of RW as patient not always agreeable to use AD. Patient completes self-care tasks at [place] assist with increased focus this week on patient organizing all needs for bathing and dressing. Patient participated in brief, informal vision assessment with results noting double vision to the right of midline. Not recommending eye patch at this time due to patient unlikely to utilize correctly secondary to cognitive deficits. Patient demonstrates improved awareness as she verbalizes both a physical and cognitive deficit stating, "I need to work on my brain and eyes."  Patient has also progressed out of enclosure bed this reporting period and into regular hospital bed in preparation for discharge home with 24/7 supervision. Patient is currently demonstrating behaviors consistent with [place] Level VI.  Patient continues to demonstrate the following deficits: poor activity tolerance, decreased balance strategies, interrupted sleep/wake cycle, decreased sustained attention, decreased safety awareness, decreased intellectual awareness, decreased emergent awareness, decreased problem solving,  decreased initiation, decreased sequencing, decreased strength, decreased coordination, decreased tolerance of therapy staff, ataxia, impaired vision and therefore will continue to benefit from skilled OT intervention to enhance overall performance with BADLs, balance, cognition, safety, cooridination, and activity tolerance.  Patient progressing toward long term goals..  Continue plan of care.  OT Short Term Goals Week 3:  OT Short Term Goal 1 (Week 3): Pt will tolerate 30 min OOB during therapy session with mod cues to increase functional activity tolerance OT Short Term Goal 1 - Progress (Week 3): Met OT Short Term Goal 2 (Week 3): Pt will initiate 1 therapeutic activity (other than ADL) with max cues OT Short Term Goal 2 - Progress (Week 3): Not met OT Short Term Goal 3 (Week 3): Pt will sustain attention to functional task for 3 min with max cues OT Short Term Goal 3 - Progress (Week 3): Met OT Short Term Goal 4 (Week 3): Pt will stand for 2 min during functional activity with min A for dynamic standing balance OT Short Term Goal 4 - Progress (Week 3): Met Week 4:  OT Short Term Goal 1 (Week 4): Patient will tolerate OOB activity for 50 min with min cues OT Short Term Goal 2 (Week 4): Pt will retrieve all bathing and dressing needs with min cues for organization and sequencing OT Short Term Goal 3 (Week 4): Pt initiate 1 therapeutic activity of choice (other than ADL) with min cues OT Short Term Goal 4 (Week 4): Pt will sustain attention to functional activity for 10 min with mod cues OT Short Term Goal 5 (Week 4): Pt will engage in functional activity in standing for 3 min with SBA for balance  Skilled Therapeutic Interventions/Progress Updates:    Session 1: Pt seen for ADL retraining with focus on functional mobility, standing balance, sequencing, organization, and task  completion. Pt received supine in bed. Pt ambulated in room with SBA using RW to retrieve clothing items she set out  yesterday. Ambulated to bathroom and completed toileting at supervision. Pt completed bathing sit<>stand in shower with supervision overall and max cues for applying soap and completion of task. Pt completed dressing sit<>stand from chair while applying lotion, etc. Pt fatigued quickly requesting to return to bed as she reported "not feeling well". Allowed pt 10 min rest break. Upon return pt declining participation in therapy secondary to "tired and not feeling well." Encouraged therapy from bed level however pt continued to decline. Pt also declining option of medication when therapist presented idea. Pt left supine in bed and educated on goal of PM session to participate for entire 60 min.   Session 2: Pt seen for 1:1 OT session with focus on active participation and intellectual awareness. Pt received supine in bed opening eyes to name and tactile cue however no verbalization. After 10 minute pt verbalized "not feeling good" however would not elaborate or answer questions to allow therapist to aid. Provided max encouragement for participation from bed level or EOB however pt declining. Pt had made "I'm sorry" notes in previous session for inappropriate name calling to therapists. With increased time and max cues pt explained card and apologized for acting inappropriate. Mother entered room with soup of pt's choice. Encouraged to sit EOB to eat soup however pt declining and turning back to therapist. Mother provided encouragement however pt repeatedly stating "you don't understand." Pt left supine in bed with mother present. Pt missing 35 min skilled OT secondary to refusal and pt reporting "not feeling well."  Therapy Documentation Precautions:  Precautions Precautions: Fall Restrictions Weight Bearing Restrictions: No General:   Vital Signs: Therapy Vitals Temp: 97.5 F (36.4 C) Temp Source: Oral Pulse Rate: 73 BP: 99/60 mmHg Patient Position (if appropriate): Lying Oxygen Therapy SpO2: 99  % Pain:   ADL:   Exercises:   Other Treatments:    See FIM for current functional status  Therapy/Group: Individual Therapy  [name] 02/04/2015, 6:48 AM

## 2015-02-04 NOTE — Progress Notes (Signed)
SLP Cancellation Note  Patient Details Name: Leah ShieldsShanese Xxxclark MRN: [id number] DOB: 02/21/1991   Cancelled treatment:       Patient missed 30 minutes of skilled SLP intervention despite max encouragement and multiple attempts. Patient reported that she "did not feel well" but was unable to provide specifics. Clinician will re-attempt as schedule allows.                                                                                                Kerrilyn Azbill 02/04/2015, 3:41 PM

## 2015-02-04 NOTE — Progress Notes (Signed)
Pt c/o nausea and vomiting and was given zofran prior to shift. Was not able to tolerated the tablet. IV compazine was ordered but not administered because pt refused. She stated that she would feel better once she went to sleep.

## 2015-02-05 ENCOUNTER — Inpatient Hospital Stay (HOSPITAL_COMMUNITY): Payer: Self-pay | Admitting: Occupational Therapy

## 2015-02-05 ENCOUNTER — Inpatient Hospital Stay (HOSPITAL_COMMUNITY): Payer: Self-pay

## 2015-02-05 ENCOUNTER — Inpatient Hospital Stay (HOSPITAL_COMMUNITY): Payer: Medicaid Other | Admitting: Speech Pathology

## 2015-02-05 ENCOUNTER — Inpatient Hospital Stay (HOSPITAL_COMMUNITY): Payer: Medicaid Other | Admitting: Physical Therapy

## 2015-02-05 ENCOUNTER — Inpatient Hospital Stay (HOSPITAL_COMMUNITY): Payer: Self-pay | Admitting: Physical Therapy

## 2015-02-05 NOTE — Progress Notes (Signed)
Physical Therapy Session Note  Patient Details  Name: Gillian ShieldsShanese Xxxclark MRN: [id number] Date of Birth: 04/17/1991  Today's Date: 02/05/2015 PT Individual Time: 0800-0900 and 1507-1530 PT Individual Time Calculation (min): 60 min and 23 min   Short Term Goals: Week 4:  PT Short Term Goal 1 (Week 4): = LTGs of overall supervision  Skilled Therapeutic Interventions/Progress Updates:  Session 1: Focus on functional mobility, standing balance, sequencing, organization, participation, and activity tolerance. Patient received asleep in bed, max verbal/tactile cues for arousal. Patient required greatly increased time (15 min) and max encouragement from therapist, RN, and family advocate to sit edge of bed. Patient ambulated in room using RW with min A to bathroom and completed toileting with supervision. Pt completed bathing sit<>stand from shower chair in shower with supervision overall and max cues for applying soap and completion of bathing tasks. Pt completed dressing sit<>stand from chair with supervision and increased time. Patient appropriately requested RN for medication administration. Patient requested to eat breakfast meal while seated in bedside chair with mod A multimodal cues needed for use of swallow strategies. Patient called therapist inappropriate names throughout session. Patient left supine in bed with bed alarm on and family advocate in room.   Session 2: Focus on vision, socially appropriate behavior, and activity tolerance. Patient supine in bed, reporting she was not feeling well. Patient initially declined but initiated asking therapist, "What did you have planned?" Therapist gave choice of working on "vision or brain" exercises. Patient sat on edge of bed and selected vision. Patient tolerated vision exercises for total of 5 min with rest breaks between with focus on correcting diplopia to R of midline. Patient fatigued quickly and elected to find 5 words in word search book. With  increased time, patient correctly identified 2 words before lying back down in bed and stating she would find the rest later. Patient initially agreeable to using bathroom before therapist departed but stated she "changed her mind." RN notified to take patient to bathroom as able. Patient was pleasant to this therapist throughout session and socially appropriate with conversation. Patient left supine in bed with bed alarm on.   Therapy Documentation Precautions:  Precautions Precautions: Fall Restrictions Weight Bearing Restrictions: No Pain: Pain Assessment Pain Assessment: No/denies pain  See FIM for current functional status  Therapy/Group: Individual Therapy  Kerney ElbeVarner, Josecarlos Harriott A 02/05/2015, 9:27 AM
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
## 2015-02-05 NOTE — Progress Notes (Signed)
Speech Language Pathology Weekly Progress and Session Note  Patient Details  Name: Leah Rojas MRN: [id number] Date of Birth: October 23, 1990  Beginning of progress report period: Jan 29, 2015 End of progress report period: Feb 05, 2015  Today's Date: 02/05/2015 SLP Individual Time: 1400-1430 SLP Individual Time Calculation (min): 30 min  Short Term Goals: Week 3: SLP Short Term Goal 1 (Week 3): Patient will consume current diet with minimal overt s/s of aspiration with Mod A multimodal cues for use of swallow strategies.  SLP Short Term Goal 1 - Progress (Week 3): Met SLP Short Term Goal 2 (Week 3): Patient will consume trials of thin liquids via teaspoon with minimal overt s/s of aspiration with Mod A multimodal cues for use of swallow strategies.  SLP Short Term Goal 2 - Progress (Week 3): Met SLP Short Term Goal 3 (Week 3): Patient will demonstrate sustained attention to functional tasks for 5 minutes with mod A multimodal cues.  SLP Short Term Goal 3 - Progress (Week 3): Met SLP Short Term Goal 4 (Week 3): Patient will engage/particiapte in 2 functional tasks per 30 minute session with Mod A multimodal cues.  SLP Short Term Goal 4 - Progress (Week 3): Met SLP Short Term Goal 5 (Week 3): Patient will improve safety awareness by allowing staff to assist her during functional tasks with Max A multimodal cues.  SLP Short Term Goal 5 - Progress (Week 3): Met SLP Short Term Goal 6 (Week 3): Patient will increase speech intelligibility at the phrase level with Mod A verbal cues.  SLP Short Term Goal 6 - Progress (Week 3): Met    New Short Term Goals: Week 4: SLP Short Term Goal 1 (Week 4): Patient will increase speech intelligibility at the phrase level with Min A verbal cues.  SLP Short Term Goal 2 (Week 4): Patient will demonstrate selective attention to functional tasks for 20 minutes with mod A multimodal cues.  SLP Short Term Goal 3 (Week 4): Patient will demonstrate functional problem  solving for basic and familiar tasks with Min A multimodal cues.  SLP Short Term Goal 4 (Week 4): Patient will recall new, daily information with supervision verbal cues.  SLP Short Term Goal 5 (Week 4): Patient will self-monitor and correct inappropriate behavior/language with Min A verbal cues.  SLP Short Term Goal 6 (Week 4): Patient will consume current diet with minimal overt s/s of aspiration with Min A verbal cues for use of swallow strategies.   Weekly Progress Updates: Patient has made functional and inconsistent gains and has met 6 of 6 STG's this reporting period due to increased participation/cooperation, attention, speech intelligibility and swallowing function. Patient is demonstrating behaviors consistent with a Rancho Level VI and requires Mod encouragement from staff and family for participation in treatment activities OOB, however, the patient's overall participation has improved this reporting period but patient continues to demonstrate intermittent verbal agitation.  Overall, patient requires Mod A multimodal cues for participation, initiation, sustained attention, functional problem solving, recall, emergent awareness and safety awareness. Patient participated in a repeat MBS on 02/03/15 and was upgraded to Dys. 3 textures with thin liquids. Patient is consuming current diet with minimal overt s/s of aspiration with Min-Mod A multimodal cues needed for use of swallow strategies. Patient also demonstrates increased speech intelligibility at the phrase level and requires Mod A verbal cues for a slow rate and increased vocal intensity to increase intelligibility to 75%. Patient/family education ongoing. Patient would benefit from continued skilled SLP intervention  to maximize her cognitive and swallowing function as well as speech intelligibility in order to maximize her overall functional independence prior to discharge. Due to patient's increased participation and overall progress, patient's  LOS has been extended for 1 week in order to improve functional independence.    Intensity: Minumum of 1-2 x/day, 30 to 90 minutes Frequency: 3 to 5 out of 7 days Duration/Length of Stay: 02/13/15 Treatment/Interventions: Patient/family education;Environmental Environmental consultant;Therapeutic Activities;Dysphagia/aspiration precaution training;Internal/external aids;Functional tasks;Cognitive remediation/compensation;Speech/Language facilitation   Daily Session  Skilled Therapeutic Interventions: Skilled treatment session focused on cognitive goals. Upon arrival, patient was supine in bed with mother present. Patient reported she did not feel well and started speaking rudely to this clinician, however, patient apologized and changed her tone after provided education in regards to her current function and goals of skilled SLP intervention. Patient also initially declined vitals from NT, however, after encouragement, vitals were obtained. RN made aware of patient's headache and administered medication. Patient participated in a functional conversation with focus on increased emergent awareness of deficits in order to improve participation. Patient independently verbalized her physical and cognitive deficits and reported she is aware she is "acting like child" and that her participation needs to improve to "get better." Patient reported she will participate in her last treatment session of the day with PT.  Patient left supine in bed with all needs within reach and bed alarm on. Continue with current plan of care.        FIM:  Comprehension Comprehension Mode: Auditory Comprehension: 5-Understands basic 90% of the time/requires cueing < 10% of the time Expression Expression Mode: Verbal Expression: 3-Expresses basic 50 - 74% of the time/requires cueing 25 - 50% of the time. Needs to repeat parts of sentences. Social Interaction Social Interaction: 4-Interacts appropriately 75 - 89% of the time  - Needs redirection for appropriate language or to initiate interaction. Problem Solving Problem Solving: 3-Solves basic 50 - 74% of the time/requires cueing 25 - 49% of the time Memory Memory: 4-Recognizes or recalls 75 - 89% of the time/requires cueing 10 - 24% of the time FIM - Eating Eating Activity: 5: Supervision/cues Pain Pain Assessment Pain Assessment: 0-10 Pain Score: 10-Worst pain ever Pain Type: Acute pain Pain Location: Head Pain Descriptors / Indicators: Headache Pain Frequency: Intermittent Pain Onset: Gradual Patients Stated Pain Goal: 1 Pain Intervention(s): Medication (See eMAR)  Therapy/Group: Individual Therapy  Jakerra Floyd 02/05/2015, 3:57 PM

## 2015-02-05 NOTE — Progress Notes (Signed)
Speech Language Pathology Daily Session Note  Patient Details  Name: Leah Rojas MRN: [id number] Date of Birth: 11/12/1990  Today's Date: 02/05/2015 SLP Individual Time: 0930-1000 SLP Individual Time Calculation (min): 30 min  Short Term Goals: Week 3: SLP Short Term Goal 1 (Week 3): Patient will consume current diet with minimal overt s/s of aspiration with Mod A multimodal cues for use of swallow strategies.  SLP Short Term Goal 1 - Progress (Week 3): Progressing toward goal SLP Short Term Goal 2 (Week 3): Patient will consume trials of thin liquids via teaspoon with minimal overt s/s of aspiration with Mod A multimodal cues for use of swallow strategies.  SLP Short Term Goal 2 - Progress (Week 3): Progressing toward goal SLP Short Term Goal 3 (Week 3): Patient will demonstrate sustained attention to functional tasks for 5 minutes with mod A multimodal cues.  SLP Short Term Goal 3 - Progress (Week 3): Progressing toward goal SLP Short Term Goal 4 (Week 3): Patient will engage/particiapte in 2 functional tasks per 30 minute session with Mod A multimodal cues.  SLP Short Term Goal 4 - Progress (Week 3): Progressing toward goal SLP Short Term Goal 5 (Week 3): Patient will improve safety awareness by allowing staff to assist her during functional tasks with Max A multimodal cues.  SLP Short Term Goal 5 - Progress (Week 3): Progressing toward goal SLP Short Term Goal 6 (Week 3): Patient will increase speech intelligibility at the phrase level with Mod A verbal cues.  SLP Short Term Goal 6 - Progress (Week 3): Progressing toward goal  Skilled Therapeutic Interventions: Skilled treatment session focused on dysphagia and cognitive goals. Upon arrival, patient was supine in bed with friend present. Patient was agreeable to participate but required extra time and encouragement to sit EOB. Patient consumed trials of Dys. 3 textures with thin liquids and demonstrated efficient mastication indicated  by minimal oral residue without overt s/s of aspiration and required Min A verbal cues for use of small bites/sips. Recommend patient upgrade to Dys. 3 textures. Patient also propelled herself to the laundry room and transferred her laundry from the washer to the dryer while standing with supervision verbal cues for safety with task. Patient handed off to OT. Continue with current plan of care.  FIM:  Comprehension Comprehension Mode: Auditory Comprehension: 5-Understands basic 90% of the time/requires cueing < 10% of the time Expression Expression Mode: Verbal Expression: 3-Expresses basic 50 - 74% of the time/requires cueing 25 - 50% of the time. Needs to repeat parts of sentences. Social Interaction Social Interaction: 4-Interacts appropriately 75 - 89% of the time - Needs redirection for appropriate language or to initiate interaction. Problem Solving Problem Solving: 3-Solves basic 50 - 74% of the time/requires cueing 25 - 49% of the time Memory Memory: 4-Recognizes or recalls 75 - 89% of the time/requires cueing 10 - 24% of the time FIM - Eating Eating Activity: 5: Supervision/cues  Pain Pain Assessment Pain Assessment: No/denies pain  Therapy/Group: Individual Therapy  Louna Rothgeb 02/05/2015, 10:57 AM

## 2015-02-05 NOTE — Progress Notes (Signed)
Occupational Therapy Note  Patient Details  Name: Leah Rojas MRN: [id number] Date of Birth: 12/27/1990  Today's Date: 02/05/2015 OT Individual Time: [id number] OT Individual Time Calculation (min): 54 min  and Today's Date: 02/05/2015 OT Missed Time: 6 Minutes Missed Time Reason: Patient unwilling/refused to participate without medical reason  Pt denied pain Individual Therapy  Pt resting in bed with mother at bedside.  Pt initially declined getting OOB stating she wasn't going to "do any therapy."   Pt subsequently sat EOB after max encouragement from therapist and mother.  Pt requested to use toilet and amb without AD with min A for balance.  Pt exhibited LOB X 3 during amb and required min A to correct.  Pt was unsteady with ambulation throughout session.  Pt required max encouragement to walk to kitchen to engage in making Jello pudding.  Pt recalled making pudding a few days ago and went to refrigerator to show therapist.  Pt directed therapist regarding supplies needed to complete tasks.  Pt attempted to stop making pudding before completed and required max encouragement to complete task.  Pt amb without AD back to room and returned to bed.  Focus on activity participation, functional amb without AD, standing balance, task initiation, sequencing, cognitive remediation, and safety awareness.   Lavone NeriLanier, Adrienne Delay [facility] 02/05/2015, 1:56 PM

## 2015-02-05 NOTE — Progress Notes (Signed)
Occupational Therapy Session Note  Patient Details  Name: Leah Rojas MRN: [id number] Date of Birth: 14-Dec-1990  Today's Date: 02/05/2015 OT Individual Time: 1100-1155 OT Individual Time Calculation (min): 55 min    Short Term Goals: Week 2:  OT Short Term Goal 1 (Week 2): Pt will tolerate 20 min OOB during therapy session with max cues to increase functional activity tolerance OT Short Term Goal 1 - Progress (Week 2): Met OT Short Term Goal 2 (Week 2): Pt will demonstrate sustained attention to functional task for 30 seconds with max cues OT Short Term Goal 2 - Progress (Week 2): Met OT Short Term Goal 3 (Week 2): Pt will verbalize 1 cognitive or physical deficit with max cues OT Short Term Goal 3 - Progress (Week 2): Met OT Short Term Goal 4 (Week 2): Pt will complete dressing with min A and min cues for initiation and completion OT Short Term Goal 4 - Progress (Week 2): Not met OT Short Term Goal 5 (Week 2): Pt will initiate 1 therapeutic activity with max cues  OT Short Term Goal 5 - Progress (Week 2): Met Week 3:  OT Short Term Goal 1 (Week 3): Pt will tolerate 30 min OOB during therapy session with mod cues to increase functional activity tolerance OT Short Term Goal 1 - Progress (Week 3): Met OT Short Term Goal 2 (Week 3): Pt will initiate 1 therapeutic activity (other than ADL) with max cues OT Short Term Goal 2 - Progress (Week 3): Not met OT Short Term Goal 3 (Week 3): Pt will sustain attention to functional task for 3 min with max cues OT Short Term Goal 3 - Progress (Week 3): Met OT Short Term Goal 4 (Week 3): Pt will stand for 2 min during functional activity with min A for dynamic standing balance OT Short Term Goal 4 - Progress (Week 3): Met  Skilled Therapeutic Interventions/Progress Updates:    1:1 Pt coming back from SLP therapy when arrived. Discussed plan for session. Agreed to go off the unit to the gift shop and outside via w/c escorted by secruity. Focus on  tolerating being out of room and bed for prolonged period of time, being in brightly lit places with uncontrolled noise volumes and frustration tolerance. Pt engaged in appropriate social conversation including interests, plans once d/c, realistic goals for self at home, dealing with her child at home etc. Pt tolerated going outside and being pushed in w/c from atrium to [facility]; once inside pt ambulated with min A from entrance doors to pt's room with 2 standing rest breaks. Pt encouraging other pts working on our unit and appropriately engaged with other recognized staff members. Pt participated in toileting with close supervision and toilet transfer with close supervision.  Pt encouraged by participation in different activity but with c/o nausea when return. Pt continues to present with visual disturbances and motion sensitivity (vestibular issues). This therapist suspects that her symptoms worsen with prolonged mobility tasks at one time. Didn't make the connection for the patient in fear pt would decline her future therapy sessions at this time. Pt continues to tolerance minimal visual exercises while sitting in bed. Left in bed.   Therapy Documentation Precautions:  Precautions Precautions: Fall Restrictions Weight Bearing Restrictions: No General: General OT Amount of Missed Time: 5 Minutes Pain: C/o headache. Declined meds. Performed ROM to neck for stiffness. Able to work through pain   See FIM for current functional status  Therapy/Group: Individual Therapy  Willeen Cass [facility]  02/05/2015, 2:28 PM

## 2015-02-06 ENCOUNTER — Inpatient Hospital Stay (HOSPITAL_COMMUNITY): Payer: Medicaid Other | Admitting: Speech Pathology

## 2015-02-06 ENCOUNTER — Inpatient Hospital Stay (HOSPITAL_COMMUNITY): Payer: Self-pay | Admitting: Occupational Therapy

## 2015-02-06 ENCOUNTER — Inpatient Hospital Stay (HOSPITAL_COMMUNITY): Payer: Medicaid Other | Admitting: Physical Therapy
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
## 2015-02-06 NOTE — Progress Notes (Signed)
Occupational Therapy Session Note  Patient Details  Name: Leah ShieldsShanese Rojas MRN: [id number] Date of Birth: 02/21/1991  Today's Date: 02/06/2015 OT Individual Time: 1100-1150 OT Individual Time Calculation (min): 50 min     Skilled Therapeutic Interventions/Progress Updates:    Pt sleeping as therapist entered initially.  She was difficulty to arouse and when she would open her eyes and see therapist she would not engage.  As therapist continued talking pt finally stated she was tired and was not going to participate, turning to the opposite side of the bed therapist was standing on as well.   Continued to attempt to engage pt with pt refusing and cussing at therapist.  Therapist finally elevated pt's head of bed and she transitioned to sitting.  Attempted to get her to apply socks but she threw them into the floor as well as her shoes.  Pt at times stating she was going to throw up so bag was given.  Therapist did not note any emesis.  Pt finally hopped out of bed and ambulated around to the bedside chair.  She continued to ignore therapist's questions except to tell him he was annoying her and to get out of his room.  Min assist needed for transition to the bedside chair as well as to walk to the sink which pt did without any reasoning.  She then returned to EOB.  Pt's mother came in at this time and continued along with therapist to coax her out of bed and to participate.  Eventually her mother and therapist provided some hands on guiding to transfer out of bed.  She utilized RW to ambulate in the hallway but pt very unsafe with walker.  Had her ambulate to the day room where she sat in a chair next to the window.  Therapist attempted to ask her questions about her work and her daughter.  Pt's mom needed to encourage the pt to state any of the answers.  At times pt's answers were inappropriate cussing.  Ambulated back to room with therapist attempted to re-direct her down the hallway toward the therapy  gym however she physically resisted re-direction.  Pt returned to room but on the way pointed to other staff members saying they were "bitches".  Therapist and mother both re-directed her that it was inappropriate and that everyone here is trying to help her.  Pt returned to bed at this point and session was stopped.  Pt left with her mother and nursing as well as bed alarm in place.   Therapy Documentation Precautions:  Precautions Precautions: Fall Restrictions Weight Bearing Restrictions: No General: General OT Amount of Missed Time: 10 Minutes   Pain: Pain Assessment Pain Assessment: No/denies pain ADL: See FIM for current functional status  Therapy/Group: Individual Therapy  Tiondra Fang OTR/L 02/06/2015, 12:20 PM

## 2015-02-06 NOTE — Progress Notes (Signed)
Occupational Therapy Session Note  Patient Details  Name: Leah ShieldsShanese Xxxclark MRN: [id number] Date of Birth: 06/04/1991  Today's Date: 02/06/2015 OT Individual Time: 1300-1330 OT Individual Time Calculation (min): 30 min   Skilled Therapeutic Interventions/Progress Updates:    1:1 Pt in bed when arrived. Willing to participate in simple self care tasks. Pt came to EOB and ate lunch demonstrating safe swallowing precautions. Engaged in appropriate social conversation during session. Expressing she felt "depressed" and that is why she doesn't want to participate and stay in bed a lot of today. However when asked her to explain more of her feelings she said she would tell me later; she didn't want to talk about it today. Engaged in playing UNO with focus on processing speed, turn taking, selective to alternating attention etc. Pt continues to demonstrate manipulating behaviors: reporting she needing to to go to the bathroom but would wait for next therapy so that would be the only activity she would have to do and she could get back into bed.   Therapy Documentation Precautions:  Precautions Precautions: Fall Restrictions Weight Bearing Restrictions: No   Pain: Pain Assessment Pain Assessment: No/denies pain just c/o fatigue and "wanting to rest"  See FIM for current functional status  Therapy/Group: Individual Therapy  Roney MansSmith, Samhita Kretsch [facility] 02/06/2015, 2:09 PM

## 2015-02-06 NOTE — Progress Notes (Signed)
Physical Therapy Session Note  Patient Details  Name: Leah ShieldsShanese Rojas MRN: [id number] Date of Birth: 01/31/1991  Today's Date: 02/06/2015 PT Individual Time: 0800-0900 PT Individual Time Calculation (min): 60 min   Short Term Goals: Week 4:  PT Short Term Goal 1 (Week 4): = LTGs of overall supervision  Skilled Therapeutic Interventions/Progress Updates:   Focus on active participation, standing balance, appropriate social interactions, and OOB tolerance. Patient received asleep in bed with family advocate in room, max verbal/tactile cues for arousal but patient continually closing eyes and refusing to participate, telling therapist to let her sleep longer. Patient repeatedly called therapist names and stated therapist was annoying and needed to "shut up." Patient asked family advocate for all needs and attempted to ignore therapist. Patient stated she wanted to eat but refused to sit edge of bed. Patient required more than reasonable amount of time x 25 minutes to sit up and eventually came to long sitting to eat breakfast. Patient impulsive during PO intake and did not respond to max multimodal A cues for safe swallowing strategies, suspect due to behavioral response because of therapist "annoying" patient. Patient ambulated to toilet with min A and completed toileting with supervision. Pt completed bathing sit<>stand from shower chair in shower with supervision overall and did not respond to therapist cues. Pt completed dressing sit<>stand from chair with close supervision. Patient's RN notified to apply underarm medication and patient left sitting in bedside chair with call bell within reach and family advocate in room.   Therapy Documentation Precautions:  Precautions Precautions: Fall Restrictions Weight Bearing Restrictions: No Pain: Pain Assessment Pain Assessment: No/denies pain  See FIM for current functional status  Therapy/Group: Individual Therapy  Kerney ElbeVarner, Chandi Nicklin  A 02/06/2015, 9:10 AM

## 2015-02-06 NOTE — Progress Notes (Signed)
Speech Language Pathology Daily Session Note  Patient Details  Name: Leah Rojas MRN: [id number] Date of Birth: 05/07/1991  Today's Date: 02/06/2015 SLP Individual Time: [id number] SLP Individual Time Calculation (min): 50 min  Short Term Goals: Week 4: SLP Short Term Goal 1 (Week 4): Patient will increase speech intelligibility at the phrase level with Min A verbal cues.  SLP Short Term Goal 2 (Week 4): Patient will demonstrate selective attention to functional tasks for 20 minutes with mod A multimodal cues.  SLP Short Term Goal 3 (Week 4): Patient will demonstrate functional problem solving for basic and familiar tasks with Min A multimodal cues.  SLP Short Term Goal 4 (Week 4): Patient will recall new, daily information with supervision verbal cues.  SLP Short Term Goal 5 (Week 4): Patient will self-monitor and correct inappropriate behavior/language with Min A verbal cues.  SLP Short Term Goal 6 (Week 4): Patient will consume current diet with minimal overt s/s of aspiration with Min A verbal cues for use of swallow strategies.   Skilled Therapeutic Interventions: Skilled treatment session focused on cognitive goals. Upon arrival, patient was supine in bed and easily awakened to verbal and tactile cues.  Patient initially declining to get out of bed and speaking inappropriately to clinician and RN. Clinician utilized environmental modifications and therapeutic use of self to encourage increased participation and tolerance of staff. Patient eventually sat EOB to take medications and consume thin liquids via cup without overt s/s of aspiration.  After encouragement, patient ambulated to sink with hand held assist and participated in self-care task of brushing her teeth with Mod I. Patient missed remaining 10 minutes of session due to declining to participate. Patient left supine in bed with alarm on and all needs within reach. Continue with current plan of care.    FIM:   Comprehension Comprehension Mode: Auditory Comprehension: 5-Understands basic 90% of the time/requires cueing < 10% of the time Expression Expression Mode: Verbal Expression: 3-Expresses basic 50 - 74% of the time/requires cueing 25 - 50% of the time. Needs to repeat parts of sentences. Social Interaction Social Interaction: 4-Interacts appropriately 75 - 89% of the time - Needs redirection for appropriate language or to initiate interaction. Problem Solving Problem Solving: 3-Solves basic 50 - 74% of the time/requires cueing 25 - 49% of the time Memory Memory: 4-Recognizes or recalls 75 - 89% of the time/requires cueing 10 - 24% of the time  Pain Pain Assessment Pain Assessment: No/denies pain  Therapy/Group: Individual Therapy  Leah Rojas 02/06/2015, 4:50 PM

## 2015-02-06 NOTE — Progress Notes (Signed)
Occupational Therapy Session Note  Patient Details  Name: Leah ShieldsShanese Xxxclark MRN: [id number] Date of Birth: 10/23/1990  Today's Date: 02/06/2015 OT Individual Time: 1400-1457 OT Individual Time Calculation (min): 57 min   Skilled Therapeutic Interventions/Progress Updates:    Therapist entered room to start session and noted pt with shirt up to her neck with chest exposed.  As therapist moved closer to pt he noted that she was masterbating as well.  Attempted to re-direct pt to stop as it was not appropriate and attempted to remove her hands.  When he removed them she continued to place them back in her pants.  After 2-3 unsuccessful attempts this therapist went and contacted the pt's nurse to come in the room.  She entered as well and also re-directed pt to stop.  She then refrained from the activity.  Re-directed pt to get OOB to go wash her hands at the sink.  She repeatedly declined to perform this and attempted to turn over on her side and cover herself up with the blanket.  Therapist removed the blankets from the bed and therapist and nurse gave mod physical cueing for pt to get OOB and walk over to the sink.  Once up she was able to ambulate to the sink and wash her hands.  She then returned to the bed.  Therapist provided max instructional cueing to participate further in therapy session but she continued to decline and would not sit up.  Eventually, he was able to persuade her to sit up and play a game of UNO.  Decreased ability to follow flow of the game and eventually she became upset and stopped playing.  She did then ambulate to the bathroom with min assist and use the toilet.  Pt needed max demonstrational cueing to then wash her hands again at the sink.  Pt needed max assist to participate further in therapy session.  Therapist provided multiple stimuli to try and get pt to get up OOB during session.  Cold washcloth used as well as therapist adjusting bed in multiple directions.  Pt continually  refusing most activity without rationalization.  At end of session she apologized to the therapist for what she had done at the beginning of the session and admitted that she was doing in hopes therapist would leave.  She also apologized to the nurse as well.  Finally she agreed to taking a walk around the dayroom with use of the RW.  Pt overall min assist to complete before returning to bed.  Call button and phone place on the bed with bed alarm activated as well.    Therapy Documentation Precautions:  Precautions Precautions: Fall Precaution Comments: none Restrictions Weight Bearing Restrictions: No General: General OT Amount of Missed Time: 10 Minutes  Pain: Pain Assessment Pain Assessment: No/denies pain ADL: See FIM for current functional status  Therapy/Group: Individual Therapy  Deidrick Rainey OTR/L 02/06/2015, 4:09 PM

## 2015-02-07 ENCOUNTER — Inpatient Hospital Stay (HOSPITAL_COMMUNITY): Payer: Medicaid Other | Admitting: *Deleted

## 2015-02-07 ENCOUNTER — Inpatient Hospital Stay (HOSPITAL_COMMUNITY): Payer: Medicaid Other | Admitting: Speech Pathology

## 2015-02-07 ENCOUNTER — Inpatient Hospital Stay (HOSPITAL_COMMUNITY): Payer: Self-pay | Admitting: Physical Therapy

## 2015-02-07 MED ORDER — PANTOPRAZOLE SODIUM 40 MG PO TBEC
40.0000 mg | DELAYED_RELEASE_TABLET | Freq: Every day | ORAL | Status: DC
  Administered 2015-02-07 – 2015-02-13 (×3): 40 mg via ORAL
  Filled 2015-02-07 (×5): qty 1

## 2015-02-07 NOTE — Progress Notes (Signed)
Speech Language Pathology Daily Session Note  Patient Details  Name: Leah Rojas MRN: [id number] Date of Birth: 08/02/1991  Today's Date: 02/07/2015 SLP Individual Time: 1430-1500 SLP Individual Time Calculation (min): 30 min  Short Term Goals: Week 4: SLP Short Term Goal 1 (Week 4): Patient will increase speech intelligibility at the phrase level with Min A verbal cues.  SLP Short Term Goal 2 (Week 4): Patient will demonstrate selective attention to functional tasks for 20 minutes with mod A multimodal cues.  SLP Short Term Goal 3 (Week 4): Patient will demonstrate functional problem solving for basic and familiar tasks with Min A multimodal cues.  SLP Short Term Goal 4 (Week 4): Patient will recall new, daily information with supervision verbal cues.  SLP Short Term Goal 5 (Week 4): Patient will self-monitor and correct inappropriate behavior/language with Min A verbal cues.  SLP Short Term Goal 6 (Week 4): Patient will consume current diet with minimal overt s/s of aspiration with Min A verbal cues for use of swallow strategies.   Skilled Therapeutic Interventions: Skilled treatment session focused on cognitive goals. Upon arrival, patient was supine in bed with visitors present. Patient initially reported she did not feel well and could not participate in session, however, with Max encouragement from clinician and visitors, patient ambulated to dayroom with hand held assist. Patient used inappropriate language towards clinician and child but eventually apologized due to child's response to patient's behavior (crying and refusing to hug patient). Patient participated in conversation with clinician and visitors and required Mod A verbal cues for intelligibility at the sentence level and for appropriate social interaction. Patient also requested thin liquid and consumed via cup without overt s/s of aspiration. Patient ambulated back to room with hand held assist and got back into bed. Patient  used inappropriate language to this clinician when leaving, therefore, clinician did not leave room until patient apologized and told clinician "bye" appropriately. Patient left in bed with alarm on and all needs within reach and visitors present. Continue with current plan of care.     FIM:  Comprehension Comprehension Mode: Auditory Comprehension: 5-Understands basic 90% of the time/requires cueing < 10% of the time Expression Expression Mode: Verbal Expression: 3-Expresses basic 50 - 74% of the time/requires cueing 25 - 50% of the time. Needs to repeat parts of sentences. Social Interaction Social Interaction: 2-Interacts appropriately 25 - 49% of time - Needs frequent redirection. Problem Solving Problem Solving: 3-Solves basic 50 - 74% of the time/requires cueing 25 - 49% of the time Memory Memory: 4-Recognizes or recalls 75 - 89% of the time/requires cueing 10 - 24% of the time  Pain Pain Assessment Pain Assessment: No/denies pain  Therapy/Group: Individual Therapy  Leah Rojas 02/07/2015, 4:07 PM
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
## 2015-02-07 NOTE — Progress Notes (Signed)
Pt was asleep at beginning of shift but would wake to voice. Refused pm meds, but took am meds after encouragement. Pt stated that she didn't feel well but she couldn't describe what she meant. She also refused am vitals stating that it was too early and she wants them taken when breakfast comes.

## 2015-02-07 NOTE — Progress Notes (Signed)
Physical Therapy Session Note  Patient Details  Name: Leah ShieldsShanese Xxxclark MRN: [id number] Date of Birth: 11/16/1990  Today's Date: 02/07/2015 PT Individual Time: 1115-1145 PT Individual Time Calculation (min): 30 min     Skilled Therapeutic Interventions/Progress Updates:  Patient in bed at the beginning of the session, when therapist introduced self,pulls covers over her head and asks to be left alone. With increased cues patient agrees to get out of bed and take a short walk, just before sitting EOB she changes her mind and lays back down. Continues encouragement provided, patient was able and willing to perform sitting EOB for about 7 min to drink a soft drink, but refused to perform any activities. She was a little more attentive when talking about her child.Patient returned back to supine with covers over her head,asking for therapist to leave. Despite increased cues and encouragement, patient gets more agitated and refuses any further participation. Communicated with nurse .  Therapy Documentation Precautions:  Precautions Precautions: Fall Precaution Comments: none Restrictions Weight Bearing Restrictions: No General: PT Amount of Missed Time (min): 15 Minutes PT Missed Treatment Reason: Patient unwilling to participate  See FIM for current functional status  Therapy/Group: Individual Therapy  Dorna MaiCzajkowska, Twanna Resh W 02/07/2015, 12:30 PM

## 2015-02-08 ENCOUNTER — Inpatient Hospital Stay (HOSPITAL_COMMUNITY): Payer: Medicaid Other | Admitting: Occupational Therapy

## 2015-02-08 ENCOUNTER — Inpatient Hospital Stay (HOSPITAL_COMMUNITY): Payer: Self-pay | Admitting: Occupational Therapy

## 2015-02-08 NOTE — Progress Notes (Signed)
Occupational Therapy Session Note  Patient Details  Name: Leah ShieldsShanese Xxxclark MRN: [id number] Date of Birth: 06/06/1991  Today's Date: 02/08/2015 OT Individual Time: [id number] OT Individual Time Calculation (min): 58 min    Short Term Goals: Week 4:  OT Short Term Goal 1 (Week 4): Patient will tolerate OOB activity for 50 min with min cues OT Short Term Goal 2 (Week 4): Pt will retrieve all bathing and dressing needs with min cues for organization and sequencing OT Short Term Goal 3 (Week 4): Pt initiate 1 therapeutic activity of choice (other than ADL) with min cues OT Short Term Goal 4 (Week 4): Pt will sustain attention to functional activity for 10 min with mod cues OT Short Term Goal 5 (Week 4): Pt will engage in functional activity in standing for 3 min with SBA for balance  Skilled Therapeutic Interventions/Progress Updates:    Pt asleep but easily awaken as therapist entered room.  Max instructional cueing to pt to talk to therapist and to initiate tasks.  Pt agreed to sit EOB and donn her pants as therapist offered to retrieve her a coke and ice of she did so.  She would not attempt donning pants without max instructional cueing and therapist bringing back the drink first.  She sat EOB as nursing attempted to give her her medications in yogurt.  She was able to take some of the meds but spit others out secondary to not being able to swallow them.  She requested some food which had been brought to her yesterday and placed in the refrigerator.  Nursing heated up chicken, potatoes, and green beans.  Max instructional cueing and min assist to get her to walk over to the sink and wash her hands.  She proceeded to return to the EOB and sit to eat some of her chicken.  Nursing mixed medications in potatoes and she was able to tolerate them.  She only managed to take a couple of bites of the potatoes and 2-3 green bean pieces.  Once finished she put everything back in the box and then told therapist  she would engage in [employer] game.  As game progressed pt needed occasion min instructional cueing to follow directions.  Session completed with pt reporting that she needed to use the bathroom.  She was able to ambulate to the toilet with min hand held assist, demonstrating LOB to the left on two occasions.  Toileting tasks completed with min steady assist.  Pt returned to supine in the bed with call button and phone within reach.  Bed alarm also turned on for safety.    Therapy Documentation Precautions:  Precautions Precautions: Fall Precaution Comments:   Restrictions Weight Bearing Restrictions: No  Pain: Pain Assessment Pain Assessment: Faces Faces Pain Scale: Hurts a little bit Pain Type: Acute pain Pain Location: Head Pain Orientation: Posterior Pain Onset: On-going Pain Intervention(s): Medication (See eMAR);Repositioned;Emotional support;Heat applied ADL: See FIM for current functional status  Therapy/Group: Individual Therapy  Rudene Poulsen OTR/L 02/08/2015, 12:41 PM

## 2015-02-08 NOTE — Progress Notes (Signed)
Occupational Therapy Session Note  Patient Details  Name: Leah Rojas MRN: [id number] Date of Birth: January 18, 1991  Today's Date: 02/08/2015 OT Individual Time:  -   1645-1745  (60 min)      Short Term Goals: Week 1:  OT Short Term Goal 1 (Week 1): Pt will initiate 1 self care task with max cues OT Short Term Goal 1 - Progress (Week 1): Met OT Short Term Goal 2 (Week 1): Pt will complete bathing with mod assist and max cues for sequencing OT Short Term Goal 2 - Progress (Week 1): Met OT Short Term Goal 3 (Week 1): Pt will stand for 30 seconds during self-care task with min assist OT Short Term Goal 3 - Progress (Week 1): Met OT Short Term Goal 4 (Week 1): Pt will sustain attention to functional task for 30 seconds with max cues OT Short Term Goal 4 - Progress (Week 1): Not met OT Short Term Goal 5 (Week 1): Pt will follow one-step commands during self-care task 75% of time with max cues OT Short Term Goal 5 - Progress (Week 1): Not met Week 2:  OT Short Term Goal 1 (Week 2): Pt will tolerate 20 min OOB during therapy session with max cues to increase functional activity tolerance OT Short Term Goal 1 - Progress (Week 2): Met OT Short Term Goal 2 (Week 2): Pt will demonstrate sustained attention to functional task for 30 seconds with max cues OT Short Term Goal 2 - Progress (Week 2): Met OT Short Term Goal 3 (Week 2): Pt will verbalize 1 cognitive or physical deficit with max cues OT Short Term Goal 3 - Progress (Week 2): Met OT Short Term Goal 4 (Week 2): Pt will complete dressing with min A and min cues for initiation and completion OT Short Term Goal 4 - Progress (Week 2): Not met OT Short Term Goal 5 (Week 2): Pt will initiate 1 therapeutic activity with max cues  OT Short Term Goal 5 - Progress (Week 2): Met Week 3:  OT Short Term Goal 1 (Week 3): Pt will tolerate 30 min OOB during therapy session with mod cues to increase functional activity tolerance OT Short Term Goal 1 -  Progress (Week 3): Met OT Short Term Goal 2 (Week 3): Pt will initiate 1 therapeutic activity (other than ADL) with max cues OT Short Term Goal 2 - Progress (Week 3): Not met OT Short Term Goal 3 (Week 3): Pt will sustain attention to functional task for 3 min with max cues OT Short Term Goal 3 - Progress (Week 3): Met OT Short Term Goal 4 (Week 3): Pt will stand for 2 min during functional activity with min A for dynamic standing balance OT Short Term Goal 4 - Progress (Week 3): Met Week 4:  OT Short Term Goal 1 (Week 4): Patient will tolerate OOB activity for 50 min with min cues OT Short Term Goal 2 (Week 4): Pt will retrieve all bathing and dressing needs with min cues for organization and sequencing OT Short Term Goal 3 (Week 4): Pt initiate 1 therapeutic activity of choice (other than ADL) with min cues OT Short Term Goal 4 (Week 4): Pt will sustain attention to functional activity for 10 min with mod cues OT Short Term Goal 5 (Week 4): Pt will engage in functional activity in standing for 3 min with SBA for balance Week 5:     Skilled Therapeutic Interventions/Progress Updates:    Pt. Lying in bed with  grand mother present.  Pt stated she wanted OT to make her bed and get a shower.  Pt. Ambulated with min assist to shower.  [name] assisted pt in shower with SBA.  Pt. Making lots of demands for OT to do various things and demonstrated increased frustration if not done quickly.  Pt showered SBA and dressed with SBA.  She stood for 3-4 minutes during dressing activity.  She would not engage in bed making and due to increased frustration gave pt some choices.  She agreed to engage in functional mobility around the unit.  Came back to room and pt was set up for dinner.  [name] reports that pt is going to live with her for 2 weeks in [place], [place].  Pt ate dinner and needed cues to not talk with food in mouth.  Pt started laughing inappropriately at some internal thoughts near end of  session.  Asked OT to leave 3-4 times.  She verbalized 5-6 profanity during 60 minute treatment.  OT left pt with grandmother and  in the bed with call button and phone within reach. Bed alarm also turned on for safety.   Therapy Documentation Precautions:  Precautions Precautions: Fall Precaution Comments:   Restrictions Weight Bearing Restrictions: No    Vital Signs: Therapy Vitals Temp:  (patient refused vitals) Pain:  none             See FIM for current functional status  Individual Therapy/ Lisa Roca 02/08/2015, 6:09 PM
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
## 2015-02-09 ENCOUNTER — Inpatient Hospital Stay (HOSPITAL_COMMUNITY): Payer: Self-pay | Admitting: Occupational Therapy

## 2015-02-09 ENCOUNTER — Inpatient Hospital Stay (HOSPITAL_COMMUNITY): Payer: Medicaid Other | Admitting: Physical Therapy

## 2015-02-09 ENCOUNTER — Inpatient Hospital Stay (HOSPITAL_COMMUNITY): Payer: Self-pay | Admitting: Speech Pathology

## 2015-02-09 MED ORDER — QUETIAPINE FUMARATE 25 MG PO TABS
25.0000 mg | ORAL_TABLET | Freq: Every day | ORAL | Status: DC
  Administered 2015-02-09: 25 mg via ORAL
  Filled 2015-02-09 (×3): qty 1

## 2015-02-09 NOTE — Progress Notes (Signed)
Physical Therapy Session Note  Patient Details  Name: Leah ShieldsShanese Rojas MRN: [id number] Date of Birth: 09/26/1990  Today's Date: 02/09/2015 PT Individual Time: 0800-0900 and 1400-1500 PT Individual Time Calculation (min): 60 min and 60 min   Short Term Goals: Week 4:  PT Short Term Goal 1 (Week 4): = LTGs of overall supervision  Skilled Therapeutic Interventions/Progress Updates:   Session 1: Focus on active participation, safety awareness, and socially appropriate behavior. Patient asleep in bed with family friend present and required more than reasonable amount of time x 15 min to sit edge of bed. Patient continues to need max multimodal cues for participation, purpose of therapy as well as consequences for choosing not to participate and attempting to "trick" therapists with multiple complaints in an attempt to "get out of therapy." Patient eventually sat edge of bed and demanded breakfast. Patient required frequent cuing throughout session to ask politely for needs. Patient consumed part of breakfast meal with min cues for following swallowing strategies. Patient stated she wanted to shower but took an additional 10 min to get up from bed and ambulate to bathroom with min A. Patient completed bathing sit<>stand from shower chair in walk in shower with distant supervision. Pt completed dressing sit<>stand from chair with close supervision. After RN applied underarm medication, patient agreeable to finishing last 6 min of session with a "walk." Gait training x 2 laps around RN station with patient self electing min HHA and mod verbal cues to complete task. While walking around unit, patient appropriately engaged in social conversation with therapist and other staff members. Patient did not use any inappropriate words or profanity during session. Patient returned to room and left supine in bed with bed alarm on and family friend in room.   Session 2: Focus on participation, attention, standing balance,  cooperation, and activity tolerance. Patient asleep in bed, max multimodal cues and environmental modifications x 20 min for patient to sit edge of bed with max coaxing from therapist and Geographical information systems officernursing secretary. Although patient "earned" 29 min of outside time with previous therapies, she declined activity. Gait using RW 2 x 200 ft with close supervision-min A in controlled environment. In stairwell, patient negotiated up/down flight of stairs using R rail ascending/descending with min A overall. In ADL apartment, patient reported need for bathroom, therefore performed toileting tasks in ADL bathroom with overall supervision for balance. Patient performed furniture transfers to couch and bed with supervision using RW, mod vc's for safety. Patient completed functional task of changing pillow cases out while seated and putting pillows back on bed with min A. Attempted to engage patient in simple kitchen task of baking a cake. Patient declined and stated she was unable to make cake. Patient read instructions on cake mix aloud with 75% accuracy and mod verbal cues for speech intelligibility. Despite therapist pointing out there were only 3 steps and minimal ingredients, patient continued to decline task. Patient became emotional and stated, "No one cares about me," stating that her mom and friend did not bring her food yesterday as well as other complaints. Reinforced patient's family and friend support as well as support from staff. Patient stated she was "discouraged" but did not think that participating more in therapy would help her. Continue to reinforce patient education on purpose and goals of therapy. Patient continued to state that she was "ready to go home." Patient ambulated back to room and encouraged to eat lunch. Patient ambulated to RN station while therapist heated up food. Patient sat edge  of bed and consumed pizza but did not attempt to return to supine, therefore RN notified and patient left sitting edge  of bed with RN present.   Therapy Documentation Precautions:  Precautions Precautions: Fall Precaution Comments:   Restrictions Weight Bearing Restrictions: No Pain: Pain Assessment Pain Assessment: No/denies pain Pain Score: 0-No pain Locomotion : Ambulation Ambulation/Gait Assistance: 4: Min assist   See FIM for current functional status  Therapy/Group: Individual Therapy  Kerney ElbeVarner, Sophiah Rolin A 02/09/2015, 9:03 AM

## 2015-02-09 NOTE — Progress Notes (Signed)
Physical Therapy Session Note  Patient Details  Name: Leah ShieldsShanese Xxxclark MRN: [id number] Date of Birth: 05/18/1991  Today's Date: 02/09/2015 PT Individual Time: 1500-1530 PT Individual Time Calculation (min): 30 min   Short Term Goals: Week 4:  PT Short Term Goal 1 (Week 4): = LTGs of overall supervision  Skilled Therapeutic Interventions/Progress Updates:   Pt received sitting EOB with RN present.  Pt reporting being hungry and asking to eat lunch off of her tray.  Therapist agreed to assist pt with lunch if pt was agreeable to participate in modified visual/vestibular assessment afterwards, pt complied.  Pt appropriately asked therapist to warm up lunch.  Supervised pt with safe swallowing with pt seated EOB.  Pt able to attend to lunch x 15 minutes and able to answer questions about visual and dizziness symptoms intermittently.  Pt requesting another cup of ice and agreeable to ambulate to RN station to ask for another cup.  Pt ambulated with min A and RW to desk and appropriately requested a cup of ice without cues from therapist.  Pt able to return to room carrying her cup while holding RW with min A for balance.  Returned to EOB and performed abbreviated visual/vestibular assessment which was significantly limited by pt attention and willingness to participate in the end.  Pt noted to have significant impairments and dysconjugate gaze with smooth pursuits in all directions, overshooting with saccades, convergence spasms with upward gaze and symptoms of "dizziness" with x 1 viewing.  Pt unwilling to perform sidelying test for BPPV and instead transferred herself into supine and requested that her bed alarm be set and she be covered up.  Pt appears to have multiple visual and vestibular impairments but difficult to assess peripheral and central involvement due to pt's low activity and frustration tolerance.  Will attempt to evaluate further and incorporate gaze stabilization adaptation and habituation  exercises into therapy, will discuss with primary PT.    Therapy Documentation Precautions:  Precautions Precautions: Fall Precaution Comments:   Restrictions Weight Bearing Restrictions: No Pain:  No reports of pain  See FIM for current functional status  Therapy/Group: Individual Therapy  Edman CircleHall, [facility] Rojas [facility] 02/09/2015, 3:44 PM
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
## 2015-02-09 NOTE — Progress Notes (Signed)
Occupational Therapy Session Note  Patient Details  Name: Leah Rojas MRN: [id number] Date of Birth: [birth year]  Today's Date: 02/09/2015 OT Individual Time: 1100-1200 OT Individual Time Calculation (min): 60 min    Short Term Goals: Week 1:  OT Short Term Goal 1 (Week 1): Pt will initiate 1 self care task with max cues OT Short Term Goal 1 - Progress (Week 1): Met OT Short Term Goal 2 (Week 1): Pt will complete bathing with mod assist and max cues for sequencing OT Short Term Goal 2 - Progress (Week 1): Met OT Short Term Goal 3 (Week 1): Pt will stand for 30 seconds during self-care task with min assist OT Short Term Goal 3 - Progress (Week 1): Met OT Short Term Goal 4 (Week 1): Pt will sustain attention to functional task for 30 seconds with max cues OT Short Term Goal 4 - Progress (Week 1): Not met OT Short Term Goal 5 (Week 1): Pt will follow one-step commands during self-care task 75% of time with max cues OT Short Term Goal 5 - Progress (Week 1): Not met  Skilled Therapeutic Interventions/Progress Updates:    1:1 Pt in bed when arrived with 2 friends from her work place present. Pt pleasant and making jokes but initially trying to get out of therapy with complaints of fatigue and headache. Pt continues to require encouragement and reasoning provided as to why she needs to participate in therapy. Pt ambulated with RW with min A with mod cuing for walker safety to dayroom. Pt with increased fall risk with turning abruptly and not paying attention to task. Pt sat at table and continued to try to "get out of doing therapy with tricks." Pt did participate in [place] game.  For every word she came up with that followed there directions she earned points equal to minutes to go outside this pm. Pt wanted to go outside; her reward for participating.  This has begun with SLP for participating.  Pt earned at total of 29 min  (19 in SLP  And 10 in OT). Discuss in great detail with mom and  pt (seperatly with both and together) about the amt she participates in therapy and her behavior and the challenges this presents with discharging home.  Pt with illegible handwriting today due to decr attention to task. Pt also required mod to max cuing to follow the directions of the game and instructions on given card due to increased attention and willingness to complete tasks.  Pt continued to use numerous attempts to "get out of doing therapy." Left in bed with bed alarm on and mom present.     Therapy Documentation Precautions:  Precautions Precautions: Fall Precaution Comments:   Restrictions Weight Bearing Restrictions: No Pain: Pain Assessment Pain Assessment: No/denies pain  See FIM for current functional status  Therapy/Group: Individual Therapy  Willeen [facility] 02/09/2015, 2:50 PM

## 2015-02-09 NOTE — Progress Notes (Signed)
Speech Language Pathology Daily Session Note  Patient Details  Name: Gillian ShieldsShanese Xxxclark MRN: [id number] Date of Birth: 04/27/1991  Today's Date: 02/09/2015 SLP Individual Time: 1000-1100 SLP Individual Time Calculation (min): 60 min  Short Term Goals: Week 4: SLP Short Term Goal 1 (Week 4): Patient will increase speech intelligibility at the phrase level with Min A verbal cues.  SLP Short Term Goal 1 - Progress (Week 4): Progressing toward goal SLP Short Term Goal 2 (Week 4): Patient will demonstrate selective attention to functional tasks for 20 minutes with mod A multimodal cues.  SLP Short Term Goal 2 - Progress (Week 4): Progressing toward goal SLP Short Term Goal 3 (Week 4): Patient will demonstrate functional problem solving for basic and familiar tasks with Min A multimodal cues.  SLP Short Term Goal 3 - Progress (Week 4): Progressing toward goal SLP Short Term Goal 4 (Week 4): Patient will recall new, daily information with supervision verbal cues.  SLP Short Term Goal 4 - Progress (Week 4): Progressing toward goal SLP Short Term Goal 5 (Week 4): Patient will self-monitor and correct inappropriate behavior/language with Min A verbal cues.  SLP Short Term Goal 5 - Progress (Week 4): Progressing toward goal SLP Short Term Goal 6 (Week 4): Patient will consume current diet with minimal overt s/s of aspiration with Min A verbal cues for use of swallow strategies.  SLP Short Term Goal 6 - Progress (Week 4): Progressing toward goal  Skilled Therapeutic Interventions: Skilled ST session focused on cognitive goals. Pt resting in bed with 2 visitors present. Pt initially refused therapy, requesting SLP return at a later time, with with encouragement pt willing to participate. Ongoing encouragement to continue participation required throughout session. Pt participated in game of [place] with visitors, but was not willing to explain how to play the game. Pt and visitors then participated in category  game involving choosing a letter of the alphabet, then naming members of 5 different categories beginning with that letter. Ongoing encouragement required for participation, until SLP offered time outside for points earned during activity. Pt's team earned 20 points. Discussed with OT for possible coordination of time outside  this afternoon.    FIM:  Comprehension Comprehension Mode: Auditory Comprehension: 5-Understands basic 90% of the time/requires cueing < 10% of the time Expression Expression Mode: Verbal Expression: 3-Expresses basic 50 - 74% of the time/requires cueing 25 - 50% of the time. Needs to repeat parts of sentences. Social Interaction Social Interaction: 2-Interacts appropriately 25 - 49% of time - Needs frequent redirection. Problem Solving Problem Solving: 3-Solves basic 50 - 74% of the time/requires cueing 25 - 49% of the time Memory Memory: 4-Recognizes or recalls 75 - 89% of the time/requires cueing 10 - 24% of the time  Pain Pain Assessment Pain Assessment: No/denies pain  Therapy/Group: Individual Therapy   Celia B. Seven HillsBueche, Wayne Medical CenterMSP, CCC-SLP [phone]  Leigh AuroraBueche, Celia Brown 02/09/2015, 12:26 PM

## 2015-02-10 ENCOUNTER — Inpatient Hospital Stay (HOSPITAL_COMMUNITY): Payer: Medicaid Other | Admitting: Speech Pathology

## 2015-02-10 ENCOUNTER — Inpatient Hospital Stay (HOSPITAL_COMMUNITY): Payer: Medicaid Other | Admitting: Occupational Therapy

## 2015-02-10 ENCOUNTER — Inpatient Hospital Stay (HOSPITAL_COMMUNITY): Payer: Medicaid Other | Admitting: Physical Therapy

## 2015-02-10 ENCOUNTER — Inpatient Hospital Stay (HOSPITAL_COMMUNITY): Payer: Medicaid Other
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
## 2015-02-10 NOTE — Progress Notes (Signed)
Speech Language Pathology Daily Session Note  Patient Details  Name: Leah ShieldsShanese Rojas MRN: [id number] Date of Birth: 12/06/1990  Today's Date: 02/10/2015 SLP Individual Time: 1000-1100 SLP Individual Time Calculation (min): 60 min  Short Term Goals: Week 4: SLP Short Term Goal 1 (Week 4): Patient will increase speech intelligibility at the phrase level with Min A verbal cues.  SLP Short Term Goal 1 - Progress (Week 4): Progressing toward goal SLP Short Term Goal 2 (Week 4): Patient will demonstrate selective attention to functional tasks for 20 minutes with mod A multimodal cues.  SLP Short Term Goal 2 - Progress (Week 4): Progressing toward goal SLP Short Term Goal 3 (Week 4): Patient will demonstrate functional problem solving for basic and familiar tasks with Min A multimodal cues.  SLP Short Term Goal 3 - Progress (Week 4): Progressing toward goal SLP Short Term Goal 4 (Week 4): Patient will recall new, daily information with supervision verbal cues.  SLP Short Term Goal 4 - Progress (Week 4): Progressing toward goal SLP Short Term Goal 5 (Week 4): Patient will self-monitor and correct inappropriate behavior/language with Min A verbal cues.  SLP Short Term Goal 5 - Progress (Week 4): Progressing toward goal SLP Short Term Goal 6 (Week 4): Patient will consume current diet with minimal overt s/s of aspiration with Min A verbal cues for use of swallow strategies.  SLP Short Term Goal 6 - Progress (Week 4): Progressing toward goal  Skilled Therapeutic Interventions: Skilled treatment session focused on cognitive goals. Upon arrival, patient was sitting upright in recliner with visitor present. Patient initially rude to clinician and required encouragement for participation and reported she did not "feel well." Patient argumentative throughout every aspect of therapy tasks and continued to require encouragement throughout entire session. Patient participated in a basic verbal description task  and required Mod A verbal cues for use of speech intelligibility strategies and for reasoning with task. Patient independently requested to use the bathroom but became frustrated with this clinician when providing assistance for ambulation and safety. Patient then pushed away from this clinician and "threw" herself back into the bed. With encouragement, patient completed verbal description task and took pain medications from RN. Patient left supine in bed with visitor present. Continue with current plan of care.    FIM:  Comprehension Comprehension Mode: Auditory Comprehension: 5-Follows basic conversation/direction: With no assist Expression Expression Mode: Verbal Expression: 3-Expresses basic 50 - 74% of the time/requires cueing 25 - 50% of the time. Needs to repeat parts of sentences. Social Interaction Social Interaction: 2-Interacts appropriately 25 - 49% of time - Needs frequent redirection. Problem Solving Problem Solving: 2-Solves basic 25 - 49% of the time - needs direction more than half the time to initiate, plan or complete simple activities Memory Memory: 3-Recognizes or recalls 50 - 74% of the time/requires cueing 25 - 49% of the time FIM - Eating Eating Activity: 5: Supervision/cues  Pain Pain Assessment Pain Assessment: 0-10 Pain Score: 2  Pain Type: Acute pain Pain Location: Head Pain Orientation: Posterior Pain Descriptors / Indicators: Headache Pain Frequency: Intermittent Pain Onset: Gradual Patients Stated Pain Goal: 1 Pain Intervention(s): Medication (See eMAR) (pt. prefers ibuprofen. Tylenol causes nausea.)  Therapy/Group: Individual Therapy  Leah Rojas 02/10/2015, 2:29 PM

## 2015-02-10 NOTE — Progress Notes (Signed)
Physical Therapy Session Note  Patient Details  Name: Gillian ShieldsShanese Xxxclark MRN: [id number] Date of Birth: 11/28/1990  Today's Date: 02/10/2015 PT Individual Time: 1100-1220 PT Individual Time Calculation (min): 80 min   Short Term Goals: Week 4:  PT Short Term Goal 1 (Week 4): = LTGs of overall supervision  Skilled Therapeutic Interventions/Progress Updates:   Patient received sitting edge of bed with RN and family friend present and engaged in session. Upon therapist's arrival, patient reported "not feeling good" with attempt to "get out" of therapy requiring max multimodal assist for participation. Patient stated, "You want to see this?" then stood up, pulled down pants, and bent over. Patient redirected to toilet as she was complaining of upset stomach. Patient again attempted to return to bed, but therapist able to physically redirect patient out of room into hallway. Patient ambulated using RW with supervision to therapy gym. Patient participated in ball toss with cues to keep eyes on the ball to work on saccades and standing balance, overall supervision. Patient with decreased sustained attention to all tasks in gym and began to shoot ball at basketball. Transitioned to game of "PIG" with focus on turn taking and adherence to rules of game, patient required max cues to attend to task as she then negotiated up/down stairs using 2 rails with supervision. Patient requested to make cookies. Ambulated to ADL apartment and located muffin mix for simple kitchen task. Patient required max multimodal cues for functional problem solving, sequencing, organization, safety, and sustained attention to task. Patient required therapist to physically block her from adding additional ingredients to mixing bowl as patient ignored therapist's cues. With increased noise and distractions from other therapists and patients in ADL apartment, patient became verbally agitated due to increased sensitivity to noise and yelled at  others to "get out." Mother joined at end of session. Patient verbally inappropriate throughout entire session but behavior worsened with kitchen task. During seated rest break, patient instructed in VOR x 1 with max verbal/demonstration cues with patient able to tolerate 3-4 trials of 5-10 sec each. Educated patient and family on exercises and purpose. When instructed to return to room, patient ignored therapist's directions and ambulated down wrong hallway, stopping in patients' doorways and dancing inappropriately while holding on to RW, requiring total A to redirect patient back towards her room. Patient with continued inappropriate behavior and gestures in hallway. Patient left sitting on bed with family present.    Therapy Documentation Precautions:  Precautions Precautions: Fall Precaution Comments:   Restrictions Weight Bearing Restrictions: No Pain: Pain Assessment Pain Assessment: 0-10 Pain Score: 2  Pain Type: Acute pain Pain Location: Head Pain Orientation: Posterior Pain Descriptors / Indicators: Headache Pain Frequency: Intermittent Pain Onset: Gradual Patients Stated Pain Goal: 1 Pain Intervention(s): Medication (See eMAR) (pt. prefers ibuprofen. Tylenol causes nausea.) Locomotion : Ambulation Ambulation/Gait Assistance: 4: Min assist;5: Supervision   See FIM for current functional status  Therapy/Group: Individual Therapy  Kerney ElbeVarner, Hildagarde Holleran A 02/10/2015, 12:59 PM

## 2015-02-10 NOTE — Progress Notes (Signed)
Occupational Therapy Session Note  Patient Details  Name: Leah Rojas MRN: [id number] Date of Birth: 10/01/1990  Today's Date: 02/10/2015 OT Individual Time: 1300-1345 OT Individual Time Calculation (min): 45 min    Short Term Goals: Week 3:  OT Short Term Goal 1 (Week 3): Pt will tolerate 30 min OOB during therapy session with mod cues to increase functional activity tolerance OT Short Term Goal 1 - Progress (Week 3): Met OT Short Term Goal 2 (Week 3): Pt will initiate 1 therapeutic activity (other than ADL) with max cues OT Short Term Goal 2 - Progress (Week 3): Not met OT Short Term Goal 3 (Week 3): Pt will sustain attention to functional task for 3 min with max cues OT Short Term Goal 3 - Progress (Week 3): Met OT Short Term Goal 4 (Week 3): Pt will stand for 2 min during functional activity with min A for dynamic standing balance OT Short Term Goal 4 - Progress (Week 3): Met Week 4:  OT Short Term Goal 1 (Week 4): Patient will tolerate OOB activity for 50 min with min cues OT Short Term Goal 2 (Week 4): Pt will retrieve all bathing and dressing needs with min cues for organization and sequencing OT Short Term Goal 3 (Week 4): Pt initiate 1 therapeutic activity of choice (other than ADL) with min cues OT Short Term Goal 4 (Week 4): Pt will sustain attention to functional activity for 10 min with mod cues OT Short Term Goal 5 (Week 4): Pt will engage in functional activity in standing for 3 min with SBA for balance  Skilled Therapeutic Interventions/Progress Updates:    1:1 Pt in bed when arrived but easily stirred.  Pt reluctantly agreed to ambulate down to ADL apartment to retrieve the muffins she made in an earlier therapy session. Pt demonstrated close supervision with a RW from her room to ADL apartment in a maximal distracting environment at a slower speed. Once in apartment able to navigate along counter to put muffins in individual bags.  Pt able to navigate back to room -  negotiating different obstacles with close supervision. However once back in room threw RW across room, bumped into two pieces of furniture and "fell" into bed. Despite safe behaviors in hallway; not once back into the room. Pt laid in bed avoiding more therapy but reluctantly delivered muffins to [name]. [name] continued to be argumentative throughout every aspect of therapy tasks and continued to require encouragement throughout entire session.  Pt extremely demeaning to staff member.   Therapy Documentation Precautions:  Precautions Precautions: Fall Precaution Comments:   Restrictions Weight Bearing Restrictions: No General: General OT Amount of Missed Time: 15 Minutes due to behavior and refusal    Pain: C/o vague pain - unspecific provided rest breaks See FIM for current functional status  Therapy/Group: Individual Therapy  Willeen Cass [facility] 02/10/2015, 2:18 PM

## 2015-02-10 NOTE — Progress Notes (Signed)
Occupational Therapy Session Note  Patient Details  Name: Leah Rojas MRN: [id number] Date of Birth: 08/30/1991  Today's Date: 02/10/2015 OT Individual Time: 0800-0900 OT Individual Time Calculation (min): 60 min    Short Term Goals: Week 4:  OT Short Term Goal 1 (Week 4): Patient will tolerate OOB activity for 50 min with min cues OT Short Term Goal 2 (Week 4): Pt will retrieve all bathing and dressing needs with min cues for organization and sequencing OT Short Term Goal 3 (Week 4): Pt initiate 1 therapeutic activity of choice (other than ADL) with min cues OT Short Term Goal 4 (Week 4): Pt will sustain attention to functional activity for 10 min with mod cues OT Short Term Goal 5 (Week 4): Pt will engage in functional activity in standing for 3 min with SBA for balance  Skilled Therapeutic Interventions/Progress Updates:    Pt seen for ADL retraining with focus on functional mobility, standing balance, activity tolerance, task initiation/completion, safety awareness, and overall cognitive remediation. Pt received supine in bed with RN present administering medication. Pt required more than reasonable amount of time and max cues to sit EOB to take medication. Pt attempting to get out of therapy stating "my leg hurts, my arm hurts, etc." Pt ambulated bed>bathroom with min-SBA using RW. Completed toileting with pt demonstrating improved safety as she informed therapist before standing. Pt ambulated toilet>walk-in shower with min A and no AD. Pt completed bathing with no cues for sequencing and initiation. Pt continues to decline washing hair despite encouragement. Completed dressing sit<>stand from chair with min A for standing balance. Pt reporting to therapist she was going to "get a gun" upon return home for "protections." Discussed safety concerns with pt with no carryover noted. Engaged in therapeutic conversation with focus on awareness. Pt verbalized visual deficit, however unable to  verbalize any cognitive deficits (declining deficits when therapist provided education). Pt verbally appropriate for first 30 min of session, however demonstrated bouts of cursing intermittently during remainder of session. Removed sheets from bed and encouraged pt to remain sitting in chair until next therapy session. Pt refused quick release belt therefore left sitting in chair with family present.   Therapy Documentation Precautions:  Precautions Precautions: Fall Precaution Comments:   Restrictions Weight Bearing Restrictions: No General:   Vital Signs:   Pain: Pain Assessment Pain Assessment: 0-10 Pain Score: 10-Worst pain ever Pain Type: Acute pain Pain Location: Head Pain Orientation: Posterior Pain Descriptors / Indicators: Headache Pain Frequency: Intermittent Pain Onset: Gradual Patients Stated Pain Goal: 1 Pain Intervention(s): Medication (See eMAR) (pt. prefers ibuprofen. Tylenol causes nausea.)  See FIM for current functional status  Therapy/Group: Individual Therapy  Daneil Danerkinson, Kharter Sestak N 02/10/2015, 12:19 PM

## 2015-02-10 NOTE — Progress Notes (Signed)
Speech Language Pathology Daily Session Note  Patient Details  Name: Leah ShieldsShanese Rojas MRN: [id number] Date of Birth: 08/25/1991  Today's Date: 02/10/2015 SLP Individual Time: 1500-1530 SLP Individual Time Calculation (min): 30 min  Short Term Goals: Week 4: SLP Short Term Goal 1 (Week 4): Patient will increase speech intelligibility at the phrase level with Min A verbal cues.  SLP Short Term Goal 1 - Progress (Week 4): Progressing toward goal SLP Short Term Goal 2 (Week 4): Patient will demonstrate selective attention to functional tasks for 20 minutes with mod A multimodal cues.  SLP Short Term Goal 2 - Progress (Week 4): Progressing toward goal SLP Short Term Goal 3 (Week 4): Patient will demonstrate functional problem solving for basic and familiar tasks with Min A multimodal cues.  SLP Short Term Goal 3 - Progress (Week 4): Progressing toward goal SLP Short Term Goal 4 (Week 4): Patient will recall new, daily information with supervision verbal cues.  SLP Short Term Goal 4 - Progress (Week 4): Progressing toward goal SLP Short Term Goal 5 (Week 4): Patient will self-monitor and correct inappropriate behavior/language with Min A verbal cues.  SLP Short Term Goal 5 - Progress (Week 4): Progressing toward goal SLP Short Term Goal 6 (Week 4): Patient will consume current diet with minimal overt s/s of aspiration with Min A verbal cues for use of swallow strategies.  SLP Short Term Goal 6 - Progress (Week 4): Progressing toward goal  Skilled Therapeutic Interventions: Pt was seen for skilled ST targeting dysphagia goals.  Upon arrival, pt was reclined in bed, awake, lethargic and required max encouragement to participate in therapy.  Pt was argumentative and rude towards SLP, using profanity and refusing to participate in any therapeutic tasks.  Pt initiated request to eat food that her family brought her.  SLP negotiated with pt that if she sat up in bed and participated in therapy that SLP  would warm food for her and allow her to eat during session.  Pt agreed and positioned herself to sitting as upright as possible in bed.  Pt consumed chicken nuggets and french fries with supervision cues for rate and portion control.  Pt was noted with prolonged mastication of solids due to decreased rotary chewing motion; however, only minimal residual solids were noted in the oral cavity post swallow.  Pt also consumed thin liquids via cup sips with no overt s/s of aspiration; however, she was noted with residual ice chips left in the oral cavity post swallow and required max assist verbal cues to not tilt her head back and close her eyes prior to chewing and swallowing ice chips.   Pt left upright in bed with bed alarm activated.  Nurse tech made aware.  Continue per current plan of care.    FIM:  Comprehension Comprehension Mode: Auditory Comprehension: 5-Follows basic conversation/direction: With no assist Expression Expression Mode: Verbal Expression: 3-Expresses basic 50 - 74% of the time/requires cueing 25 - 50% of the time. Needs to repeat parts of sentences. Social Interaction Social Interaction: 2-Interacts appropriately 25 - 49% of time - Needs frequent redirection. Problem Solving Problem Solving: 2-Solves basic 25 - 49% of the time - needs direction more than half the time to initiate, plan or complete simple activities Memory Memory: 3-Recognizes or recalls 50 - 74% of the time/requires cueing 25 - 49% of the time FIM - Eating Eating Activity: 5: Supervision/cues  Pain Pain Assessment Pain Assessment: No/denies pain  Therapy/Group: Individual Therapy  Jina Olenick, Melanee SpryNicole L  02/10/2015, 4:37 PM

## 2015-02-11 ENCOUNTER — Inpatient Hospital Stay (HOSPITAL_COMMUNITY): Payer: Medicaid Other | Admitting: Physical Therapy

## 2015-02-11 ENCOUNTER — Inpatient Hospital Stay (HOSPITAL_COMMUNITY): Payer: Medicaid Other | Admitting: Speech Pathology

## 2015-02-11 ENCOUNTER — Inpatient Hospital Stay (HOSPITAL_COMMUNITY): Payer: Medicaid Other

## 2015-02-11 MED ORDER — QUETIAPINE FUMARATE 25 MG PO TABS
25.0000 mg | ORAL_TABLET | Freq: Two times a day (BID) | ORAL | Status: DC
  Administered 2015-02-11 – 2015-02-13 (×4): 25 mg via ORAL
  Filled 2015-02-11 (×7): qty 1
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
## 2015-02-11 NOTE — Discharge Instructions (Signed)
Inpatient Rehab Discharge Instructions  Leah ShieldsShanese Rojas Discharge date and time: No discharge date for patient encounter.   Activities/Precautions/ Functional Status: Activity: activity as tolerated Diet: Mechanical soft Wound Care: none needed Functional status:  ___ No restrictions     ___ Walk up steps independently _x__ 24/7 supervision/assistance   ___ Walk up steps with assistance ___ Intermittent supervision/assistance  ___ Bathe/dress independently ___ Walk with walker     ___ Bathe/dress with assistance ___ Walk Independently    ___ Shower independently _x__ Walk with assistance    ___ Shower with assistance ___ No alcohol     ___ Return to work/school ________    COMMUNITY REFERRALS UPON DISCHARGE:    Home Health:   PT    OT     ST                      Agency: [facility] Phone: [phone]   Medical Equipment/Items Ordered: rolling walker and tub bench                                                      [facility]Supplier: [facility] [phone]  Other: PCS Aide referral placed and a nurse from a local care agency ([facility] should be contacting you to make a visit to start services   GENERAL COMMUNITY RESOURCES FOR PATIENT/FAMILY:  Support Groups: [facility] Support [facility]    Caregiver Support: same     Special Instructions:    My questions have been answered and I understand these instructions. I will adhere to these goals and the provided educational materials after my discharge from the hospital.  Patient/Caregiver Signature _______________________________ Date __________  Clinician Signature _______________________________________ Date __________  Please bring this form and your medication list with you to all your follow-up doctor's appointments.

## 2015-02-11 NOTE — Progress Notes (Signed)
Social Work Patient ID: Leah Rojas, female   DOB: 02-20-1991, 24 y.o.   MRN: [id number]   Met with pt yesterday afternoon and with mother this morning to review team conference.  Mother had also spoken with Dr. Naaman Plummer this morning to review concerns and reports that she was pleased with the guidance he offered and feeling better about pt's d/c at end of week as scheduled.  Mother and pt's sister to be here tomorrow morning for education.  Have also spoken with Willeen Cass, OT about developing a "home plan" document that mother can have regarding managing behaviors at home.  Have reviewed community referrals made and will provide written information on these referrals as well.  Continue to follow.  Graysyn Bache, LCSW

## 2015-02-11 NOTE — Plan of Care (Signed)
Problem: RH Ambulation Goal: LTG Patient will ambulate in community environment (PT) LTG: Patient will ambulate in community environment, # of feet with assistance (PT).  Outcome: Not Applicable Date Met:  02/11/15 D/C due to patient refused to participate in community ambulation.      

## 2015-02-11 NOTE — Progress Notes (Signed)
Occupational Therapy Session Note  Patient Details  Name: Leah Rojas MRN: [id number] Date of Birth: 09/26/1990  Today's Date: 02/11/2015 OT Individual Time: [id number] OT Individual Time Calculation (min): 47 min    Short Term Goals: Week 4:  OT Short Term Goal 1 (Week 4): Patient will tolerate OOB activity for 50 min with min cues OT Short Term Goal 2 (Week 4): Pt will retrieve all bathing and dressing needs with min cues for organization and sequencing OT Short Term Goal 3 (Week 4): Pt initiate 1 therapeutic activity of choice (other than ADL) with min cues OT Short Term Goal 4 (Week 4): Pt will sustain attention to functional activity for 10 min with mod cues OT Short Term Goal 5 (Week 4): Pt will engage in functional activity in standing for 3 min with SBA for balance  Skilled Therapeutic Interventions/Progress Updates:    Pt seen for 1:1 OT session with focus on functional mobility, activity tolerance, pt/family education, and social interaction. Pt received supine in bed agreeable to shower in ADL apartment. Pt required 20 min to sit EOB then requested to toilet. Pt ambulated bed<>toilet with supervision using RW. Pt completed toilet task at supervision level and initiated use of hand sanitizer following toileting. Pt returned to EOB and began vomiting small amount of clear liquid. Provided pt with sprite and encouraged to eat some of breakfast as pt had taken medication on empty stomach, likely causing nausea. Pt's mother called during vomiting episode and questioning vomiting. Educated on therapy completed and deferred to MD with questions in regards to medication and cause of vomiting. Pt declining getting OOB despite cues of encouragement from therapist and family supported. Discussed with family supporter that hands on education needed to be completed, however pt declining at this time. Will follow-up as able. Of note pt did not curse at this therapist during session.   Therapy  Documentation Precautions:  Precautions Precautions: Fall Precaution Comments:   Restrictions Weight Bearing Restrictions: No General: General OT Amount of Missed Time: 13 Minutes PT Missed Treatment Reason: Patient unwilling to participate Vital Signs:  Pain: No report of pain  See FIM for current functional status  Therapy/Group: Individual Therapy  Daneil Danerkinson, Tejal Monroy N 02/11/2015, 12:16 PM

## 2015-02-11 NOTE — Progress Notes (Signed)
Physical Therapy Session Note  Patient Details  Name: Leah Rojas MRN: [id number] Date of Birth: 04/15/1991  Today's Date: 02/11/2015 PT Individual Time: 0900-0930   PT Individual Time Calculation (min): 30 min   Short Term Goals: Week 4:  PT Short Term Goal 1 (Week 4): = LTGs of overall supervision  Skilled Therapeutic Interventions/Progress Updates:   Session focused on addressing patient needs and socially appropriate behavior. Patient asleep in bed, requiring mod multimodal cues for arousal. Patient immediately refused therapy due to "not feeling good." Educated patient on need to participate in family training in preparation for discharge and encouraged patient to sit edge of bed. Patient repeatedly told therapist to get out, required mod cues for appropriate behavior. MD and PA arrived due to patient c/o nausea, patient perseverated on refusing therapy. Therefore therapist exited room to increase success of MD visit. Therapist directed nurse secretary to assist RN with delivering anti-nausea medications. Upon therapist's return to room, X-ray arrived. Patient agreeable to participate in x-ray and repositioned in bed with min cues. Patient left supine in bed with x-ray in room. Education provided to family friend in regards to recommendations for 24/7 assistance, min assist with all mobility, vestibular dysfunction contributing to patient symptoms, and possible responses to environmental stimuli such as outdoor/community environments including riding in car. Patient's family friend verbalized understanding.   Therapy Documentation Precautions:  Precautions Precautions: Fall Precaution Comments:   Restrictions Weight Bearing Restrictions: No General: PT Amount of Missed Time (min): 30 Minutes PT Missed Treatment Reason: Patient unwilling to participate Pain:  C/o nausea, RN and MD aware  See FIM for current functional status  Therapy/Group: Individual Therapy  Kerney ElbeVarner, Leah Rojas  A 02/11/2015, 9:45 AM

## 2015-02-11 NOTE — Progress Notes (Signed)
Patient unwilling to participate therapy sessions today. Also refusing to eat, to take scheduled mouthwash, and Ensure supplements. Patient in bed sleeping refused to allow nurse tech and nurse to obtain afternoon vital signs after several attempts. Will report findings to on-coming nurse to continue to monitor patient.

## 2015-02-11 NOTE — Progress Notes (Signed)
Physical Therapy Session Note  Patient Details  Name: Leah ShieldsShanese Xxxclark MRN: [id number] Date of Birth: 08/15/1991  Today's Date: 02/11/2015  Short Term Goals: Week 4:  PT Short Term Goal 1 (Week 4): = LTGs of overall supervision  PT attempted for 10 minutes to get pt to participate in therapy session, but pt refused. Pt's advocate sitting in a chair silently next to pt, offered no encouragement for pt to participate in PT.   Therapy Documentation Precautions:  Precautions Precautions: Fall Precaution Comments:   Restrictions Weight Bearing Restrictions: No General: PT Amount of Missed Time (min): 60 Minutes PT Missed Treatment Reason: Patient unwilling to participate  See FIM for current functional status  Therapy/Group: Individual Therapy  Mable Dara M 02/11/2015, 8:53 AM

## 2015-02-11 NOTE — Progress Notes (Signed)
Social Work  Discharge Note  The overall goal for the admission was met for:   Discharge location: Yes - home with family providing 24/7 assistance  Length of Stay: Yes - 30 days  Discharge activity level: Yes - supervision / light physical assistance overall  Home/community participation: Yes  Services provided included: MD, RD, PT, OT, SLP, RN, TR, Pharmacy, Neuropsych and SW  Financial Services: Medicaid  Follow-up services arranged: Home Health: PT, OT, ST via [place], DME: rolling walker and tub bench via [facility], Other: [facility] referral placed and mother aware they will contact her directly to set up home assessment after d/c and Patient/Family has no preference for HH/DME agencies  Comments (or additional information):  Referred to [employer] Program and to the [facility] and [place] to establish primary MD care  Patient/Family verbalized understanding of follow-up arrangements: Yes  Individual responsible for coordination of the follow-up plan: mother  Confirmed correct DME delivered: Lennart Pall 02/11/2015    HOYLE, LUCY

## 2015-02-11 NOTE — Progress Notes (Signed)
Speech Language Pathology Daily Session Note  Patient Details  Name: Leah ShieldsShanese Rojas MRN: [id number] Date of Birth: 09/27/1990  Today's Date: 02/11/2015  Session 1: SLP Individual Time: 1030-1046 SLP Individual Time Calculation (min): 16 min and Today's Date: 02/11/2015 SLP Missed Time: 44 Minutes Missed Time Reason: Patient unwilling to participate    Session 2: SLP Individual Time: [id number] SLP Individual Time Calculation (min): 15 min and Today's Date: 02/11/2015 SLP Missed Time: 15 Minutes Missed Time Reason: Patient unwilling to participate    Short Term Goals: Week 4: SLP Short Term Goal 1 (Week 4): Patient will increase speech intelligibility at the phrase level with Min A verbal cues.  SLP Short Term Goal 1 - Progress (Week 4): Progressing toward goal SLP Short Term Goal 2 (Week 4): Patient will demonstrate selective attention to functional tasks for 20 minutes with mod A multimodal cues.  SLP Short Term Goal 2 - Progress (Week 4): Progressing toward goal SLP Short Term Goal 3 (Week 4): Patient will demonstrate functional problem solving for basic and familiar tasks with Min A multimodal cues.  SLP Short Term Goal 3 - Progress (Week 4): Progressing toward goal SLP Short Term Goal 4 (Week 4): Patient will recall new, daily information with supervision verbal cues.  SLP Short Term Goal 4 - Progress (Week 4): Progressing toward goal SLP Short Term Goal 5 (Week 4): Patient will self-monitor and correct inappropriate behavior/language with Min A verbal cues.  SLP Short Term Goal 5 - Progress (Week 4): Progressing toward goal SLP Short Term Goal 6 (Week 4): Patient will consume current diet with minimal overt s/s of aspiration with Min A verbal cues for use of swallow strategies.  SLP Short Term Goal 6 - Progress (Week 4): Progressing toward goal  Skilled Therapeutic Interventions:  Session 1: Skilled treatment session focused on cognitive goals. Upon arrival, patient was asleep  while supine in bed. Patient's advocate present and reported patient has not been feeling well but recently took medication for nausea and asked if clinician could come back in 30 minutes. SLP went back into room 30 minutes later with RN. Patient declined participation in therapy despite Max encouragement but was agreeable to taking medications.  Patient left supine in bed with alarm on and visitor present. Continue with current plan of care.     Session 2: Skilled treatment session focused on cognitive goals. Upon arrival, patient was asleep while supine in bed. Patient required mod verbal and tactile cues for arousal and was essentially nonverbal throughout the session. Patient would make eye contact with clinician but then would immediately close her eyes despite Max tactile and environmental cues. Clinician also offered and heated up the patient's lunch, however, patient continued to ignore this clinician. Patient left supine in bed with alarm on. Continue with current plan of care.   FIM:  Comprehension Comprehension Mode: Auditory Comprehension: 5-Follows basic conversation/direction: With no assist Expression Expression Mode: Verbal Expression: 4-Expresses basic 75 - 89% of the time/requires cueing 10 - 24% of the time. Needs helper to occlude trach/needs to repeat words. Social Interaction Social Interaction: 2-Interacts appropriately 25 - 49% of time - Needs frequent redirection. Problem Solving Problem Solving: 2-Solves basic 25 - 49% of the time - needs direction more than half the time to initiate, plan or complete simple activities Memory Memory: 2-Recognizes or recalls 25 - 49% of the time/requires cueing 51 - 75% of the time FIM - Eating Eating Activity: 5: Supervision/cues  Pain Pain Assessment Pain Assessment: No/denies  pain  Therapy/Group: Individual Therapy  Leah Rojas 02/11/2015, 4:09 PM

## 2015-02-12 ENCOUNTER — Inpatient Hospital Stay (HOSPITAL_COMMUNITY): Payer: Medicaid Other | Admitting: Speech Pathology

## 2015-02-12 ENCOUNTER — Inpatient Hospital Stay (HOSPITAL_COMMUNITY): Payer: Medicaid Other

## 2015-02-12 ENCOUNTER — Inpatient Hospital Stay (HOSPITAL_COMMUNITY): Payer: Medicaid Other | Admitting: Physical Therapy

## 2015-02-12 ENCOUNTER — Inpatient Hospital Stay (HOSPITAL_COMMUNITY): Payer: Medicaid Other | Admitting: Occupational Therapy

## 2015-02-12 NOTE — Progress Notes (Signed)
Speech Language Pathology Session Note & Discharge Summary  Patient Details  Name: Leah Rojas MRN: [id number] Date of Birth: 1991/04/17  Today's Date: 02/12/2015 SLP Individual Time: 1000-1100 SLP Individual Time Calculation (min): 60 min   Skilled Therapeutic Interventions:  Skilled treatment session focused on family education. Patient was re-administered the MoCA and scored a 13/30 with a score of 26 or above considered normal. Patient demonstrated difficulty in the areas of attention and recall. Patient also consumed minimal trials of regular textures. Patient demonstrated prolonged mastication with minimal oral residue, suspect function was impacted by impulsivity. Recommend patient continue current diet. Patient's mom, sister and advocate Leah Rojas) were educated in regards to patient's current swallowing, speech and cognitive function. All were educated on patient's current diet recommendations, appropriate textures, swallowing compensatory strategies and medication administration. All were also educated on strategies to utilize at home to increase her overall speech intelligibility and safety, especially in regards to need for 24 hour supervision and behavioral management.  A cognitive home program was also provided to the family to reinforce all information that was addressed with all therapy disciplines. All verbalized understanding of education and provided appropriate cueing in regards to behavioral management. Patient will discharge home with 24 hour supervision from family.   Patient has met 3 of 7 long term goals.  Patient to discharge at overall Max level.   Reasons goals not met: Patient continues to require overall Max A for participation and cooperation with all theraputic tasks, therefore, patient continues to require overall Max A to complete functional and familiar tasks safely   Clinical Impression/Discharge Summary: Patient has made slow and inconsistent gains and has met 3 of  7 LTG's this admission due to increased speech intelligibility and swallowing function. Currently, patient is consuming Dys. 3 textures with thin liquids with minimal overt s/s of aspiration and requires supervision verbal cues for use of swallowing strategies.  Patient also requires overall Min-Mod A for use of a slow rate and increased vocal intensity to increase speech intelligibility at the sentence level to ~90-100%.  Patient continues to demonstrate behaviors consistent with a Rancho Level VI, however, patient's progress was limited due to patient's poor cooperation/participation and constant behavioral issues (verbal agitation, low frustration tolerance, etc). Overall, patient continues to require Max A multimodal cues to complete functional and familiar tasks safely in regards to participation, attention, initiation, problem solving, recall and awareness. Patient's safety is also impacted by patient's continued impulsivity.  Family education has been complete and a cognitive home program has also been provided to the family to reinforce all information that has been addressed.  Patient will discharge home with 24 hour supervision from family and would benefit from f/u SLP services to maximize her swallowing and cognitive function as well as her speech intelligibility in order to maximize her overall functional independence in order to reduce caregiver burden.    Care Partner:  Caregiver Able to Provide Assistance: Yes  Type of Caregiver Assistance: Physical;Cognitive  Recommendation:  24 hour supervision/assistance;Home Health SLP  Rationale for SLP Follow Up: Maximize functional communication;Reduce caregiver burden;Maximize cognitive function and independence;Maximize swallowing safety   Equipment: N/A   Reasons for discharge: Discharged from hospital   Patient/Family Agrees with Progress Made and Goals Achieved: Yes   See FIM for current functional status  Leah Rojas 02/12/2015, 4:38  PM
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
## 2015-02-12 NOTE — Progress Notes (Signed)
Occupational Therapy Session Note  Patient Details  Name: Leah Rojas MRN: [id number] Date of Birth: 12/27/1990  Today's Date: 02/12/2015 OT Individual Time: 1330-1400 OT Individual Time Calculation (min): 30 min    Short Term Goals: Week 4:  OT Short Term Goal 1 (Week 4): Patient will tolerate OOB activity for 50 min with min cues OT Short Term Goal 2 (Week 4): Pt will retrieve all bathing and dressing needs with min cues for organization and sequencing OT Short Term Goal 3 (Week 4): Pt initiate 1 therapeutic activity of choice (other than ADL) with min cues OT Short Term Goal 4 (Week 4): Pt will sustain attention to functional activity for 10 min with mod cues OT Short Term Goal 5 (Week 4): Pt will engage in functional activity in standing for 3 min with SBA for balance  Skilled Therapeutic Interventions/Progress Updates:    1:1 Pt in bed when arrived. Focused on participating in basic self care needs and ability to identify tasks for a healthy lifestyle.  Demonstrated by getting out of bed and eating lunch and applying cream and powder to wound underneath her left UE. Pt requires more than reasonable amt of time to participate in tasks and mod cuing for appropriate social interactions. Left in bed at conclusion of session.   Therapy Documentation Precautions:  Precautions Precautions: Fall Precaution Comments:   Restrictions Weight Bearing Restrictions: No Pain: No c/o pain  See FIM for current functional status  Therapy/Group: Individual Therapy  Roney MansSmith, Sherly Brodbeck [facility] 02/12/2015, 2:30 PM

## 2015-02-12 NOTE — Progress Notes (Signed)
Occupational Therapy Discharge Summary  Patient Details  Name: Leah Rojas MRN: [id number] Date of Birth: 08/17/1991  Today's Date: 02/12/2015   Patient has met 9 of 12 long term goals due to improved activity tolerance, improved balance, postural control, ability to compensate for deficits, functional use of  RIGHT upper, RIGHT lower, LEFT upper and LEFT lower extremity, improved attention, improved awareness and improved coordination.  Patient to discharge at overall Supervision level.  Patient's care partner is independent to provide the necessary physical and cognitive assistance at discharge. Patient's family has participated in family education/training. Therapy team provided family with home program as well as received extensive education on goal of home program and recommendation of 24/7 supervision.  Reasons goals not met: Patient did not meet cognition goals as patient requires max assist for cognitive deficits. Patient capable of meeting cognitive goals, however did not due to lack of participation.  Recommendation:  Patient will benefit from ongoing skilled OT services in home health setting to continue to advance functional skills in the area of BADLs, balance, cognitive remediation, safety, coordination, activity tolerance, and minimize fall risk.  Equipment: TTB  Reasons for discharge: treatment goals met and discharge from hospital  Patient/family agrees with progress made and goals achieved: Yes  OT Discharge Precautions/Restrictions  Precautions Precautions: Fall Restrictions Weight Bearing Restrictions: No General   Vital Signs Therapy Vitals Temp: 98.4 F (36.9 C) Temp Source: Oral Pulse Rate: 82 Resp: 18 BP: (!) 100/58 mmHg Patient Position (if appropriate): Lying Oxygen Therapy SpO2: 100 % O2 Device: Not Delivered Pain Pain Assessment Pain Assessment: No/denies pain ADL   Vision/Perception  Vision- History Baseline Vision/History: No visual  deficits Patient Visual Report: Diplopia Vision- Assessment Vision Assessment?: Yes Eye Alignment: Within Functional Limits Ocular Range of Motion: Within Functional Limits Tracking/Visual Pursuits: Unable to hold eye position out of midline Saccades: Additional eye shifts occurred during testing Diplopia Assessment: Only with right gaze Depth Perception: Overshoots;Undershoots  Cognition Overall Cognitive Status: Impaired/Different from baseline Arousal/Alertness: Awake/alert Orientation Level: Oriented X4 Attention: Sustained;Focused Focused Attention: Appears intact Sustained Attention: Impaired Sustained Attention Impairment: Verbal basic;Functional basic Memory: Impaired Memory Impairment: Decreased recall of new information Awareness: Impaired Awareness Impairment: Intellectual impairment;Emergent impairment;Anticipatory impairment Problem Solving: Impaired Problem Solving Impairment: Verbal basic;Functional basic Executive Function:  (all impaired) Behaviors: Impulsive;Verbal agitation;Poor frustration tolerance Safety/Judgment: Impaired Rancho [employer] Scales of Cognitive Functioning: Confused/appropriate Sensation Sensation Light Touch: Appears Intact Hot/Cold: Appears Intact Proprioception: Appears Intact Coordination Gross Motor Movements are Fluid and Coordinated: No Fine Motor Movements are Fluid and Coordinated: No (mildly impaired) Coordination and Movement Description: ataxic Motor  Motor Motor: Ataxia Motor - Discharge Observations: ataxia Mobility  Bed Mobility Bed Mobility: Supine to Sit;Sit to Supine Supine to Sit: 6: Modified independent (Device/Increase time);HOB flat Sit to Supine: 6: Modified independent (Device/Increase time);HOB flat Transfers Transfers: Sit to Stand;Stand to Sit Sit to Stand: 5: Supervision Sit to Stand Details: Verbal cues for sequencing Stand to Sit: 5: Supervision Stand to Sit Details (indicate cue type and reason):  Verbal cues for sequencing  Trunk/Postural Assessment  Cervical Assessment Cervical Assessment: Within Functional Limits Thoracic Assessment Thoracic Assessment: Within Functional Limits Lumbar Assessment Lumbar Assessment: Within Functional Limits Postural Control Postural Control: Deficits on evaluation Righting Reactions: impaired/delayed  Balance Balance Balance Assessed: Yes Dynamic Sitting Balance Dynamic Sitting - Balance Support: Feet supported;During functional activity Dynamic Sitting - Level of Assistance: 6: Modified independent (Device/Increase time) Static Standing Balance Static Standing - Balance Support: During functional activity;Bilateral upper extremity supported  Static Standing - Level of Assistance: 5: Stand by assistance Dynamic Standing Balance Dynamic Standing - Balance Support: During functional activity;Bilateral upper extremity supported Dynamic Standing - Level of Assistance: 5: Stand by assistance Extremity/Trunk Assessment RUE Assessment RUE Assessment: Within Functional Limits LUE Assessment LUE Assessment: Within Functional Limits  See FIM for current functional status  [name] 02/12/2015, 4:53 PM

## 2015-02-12 NOTE — Plan of Care (Signed)
Problem: RH Memory Goal: LTG Patient will demonstrate ability for day to day (OT) LTG: Patient will demonstrate ability for day to day recall/carryover during activities of daily living with assist (OT)  Outcome: Not Met (add Reason) Not met as patient requires max assist for cognitive deficits. Patient able to meet cognitive goals, however did not due to lack of participation.  Problem: RH Attention Goal: LTG Patient will demonstrate focused/sustained (OT) LTG: Patient will demonstrate focused/sustained/selective/alternating/divided attention during functional activities in specific environment with assist for # of minutes (OT)  Outcome: Not Met (add Reason) Not met as patient requires max assist for cognitive deficits. Patient able to meet cognitive goals, however did not due to lack of participation.  Problem: RH Awareness Goal: LTG: Patient will demonstrate intellectual/emergent (OT) LTG: Patient will demonstrate intellectual/emergent/anticipatory awareness with assist during a functional activity (OT)  Outcome: Not Met (add Reason) Not met as patient requires max assist for cognitive deficits. Patient able to meet cognitive goals, however did not due to lack of participation.

## 2015-02-12 NOTE — Progress Notes (Signed)
Occupational Therapy Session Note  Patient Details  Name: Leah ShieldsShanese Xxxclark MRN: [id number] Date of Birth: 03/27/1991  Today's Date: 02/12/2015 OT Individual Time: [id number] and 1111-1201 OT Individual Time Calculation (min): 57 min and 50 min     Short Term Goals: Week 5:     Skilled Therapeutic Interventions/Progress Updates:    Session 1: Pt seen for 1:1 OT session with focus on caregiver education, functional mobility, and cognitive remediation. Pt received side lying in bed with back to therapist. Pt initially declining getting OOB therefore completed verbal education with caregiver "Ms. Channing Muttersoy." Provided copy of home program with therapist reviewing and providing specific examples of expectations at home. Discussed behavioral management, visitors schedule, emphasis on 24/7 supervision, and goal of pt engaging in self-care tasks on daily basis. Pt requesting to toilet. Ms. Channing MuttersRoy assisted pt to bathroom with min-SBA using RW. Pt completed toileting at supervision then returned to sitting EOB. Pt sat EOB with max cues to eat breakfast with verbal cues for safe swallowing. Pt verbally aggressive throughout session with therapist correcting behavior throughout. Pt left supine in bed with all needs in reach.  Session 2: Pt seen for ADL retraining with focus on functional mobility, family education/training, cognitive remediation, activity tolerance, and standing balance. Pt received supine in bed with mother and caregiver present. Pt ambulated in room to retrieve clothing items with min A and max cues for task completion. Pt ambulated to tub shower with mother providing min A using RW. Pt resistant to therapist in room during toileting therefore pt completed with mother. Pt retrieved bathing items for shower with min cues and transferred into tub shower at supervision level. Completed bathing at supervision with mod cues for thoroughness and cleaning up after task. Pt completed dressing with increased time  and max cues for donning socks before returning to room. Continued discussion/education with mother and caregiver of home program, discharge plans, follow-up, etc with use of teach back technique. Pt continued to be verbally aggressive throughout session with mother appropriately correcting. Pt left sitting in w/c with all needs in reach.   Therapy Documentation Precautions:  Precautions Precautions: Fall Precaution Comments:   Restrictions Weight Bearing Restrictions: No General:   Vital Signs:  Pain: Pain Assessment Pain Assessment: No/denies pain  See FIM for current functional status  Therapy/Group: Individual Therapy  Daneil Danerkinson, Hooria Gasparini N 02/12/2015, 12:04 PM

## 2015-02-12 NOTE — Progress Notes (Signed)
Physical Therapy Discharge Summary  Patient Details  Name: Leah Rojas MRN: [id number] Date of Birth: 02-Mar-1991  Today's Date: 02/12/2015 PT Individual Time: 0900-1000 PT Individual Time Calculation (min): 60 min    Patient has met 10 of 13 long term goals due to improved activity tolerance, improved balance, improved postural control, increased strength, decreased pain, ability to compensate for deficits, functional use of  right upper extremity, right lower extremity, left upper extremity and left lower extremity, improved attention, improved awareness and improved coordination.  Patient to discharge at an ambulatory level [place]. Patient's care partner is independent to provide the necessary physical and cognitive assistance at discharge.  Reasons goals not met: Patient continues to require overall max multimodal assist for memory, sustained attention, and emergent awareness. Anticipate progress limited by lack of participation with therapies.   Recommendation:  Patient will benefit from ongoing skilled PT services in home health setting to continue to advance safe functional mobility, address ongoing impairments in coordination, strength, endurance, OOB tolerance, standing balance and balance strategies, attention, emergent awareness, safety awareness, problem solving, initiation, memory, vestibular function, vision, and minimize fall risk.  Equipment: RW  Reasons for discharge: treatment goals met and discharge from hospital  Patient/family agrees with progress made and goals achieved: Yes  Skilled Therapeutic Intervention Session focused on family training with patient, mom, sister, and family friend in preparation for discharge home with recommendation of 24/7 supervision. Patient ambulated using RW in home and controlled environments, performed simulated car transfer to sedan height, negotiated flight of stairs in stairwell using 1-2 rails, and performed furniture transfers  to couch in family room with sister and mother providing min guard/assist.  Reviewed cognitive home program for behavioral management with handout as well as extensive education on goal of home program and recommendation of 24/7 supervision. Education provided on purpose and goals of HHPT f/u services. All verbalized understanding of education and provided appropriate cueing to patient in regards to behavioral management. Patient's family with no further questions regarding discharge.   PT Discharge Precautions Precautions: Fall Restrictions Weight Bearing Restrictions: No Pain Pain Assessment Pain Assessment: No/denies pain Vision/Perception  Vision - Assessment Eye Alignment: Within Functional Limits Ocular Range of Motion: Within Functional Limits Tracking/Visual Pursuits: Unable to hold eye position out of midline Saccades: Additional eye shifts occurred during testing Diplopia Assessment: Only with right gaze  Cognition Overall Cognitive Status: Impaired/Different from baseline Arousal/Alertness: Awake/alert Orientation Level: Oriented X4 Attention: Focused;Sustained Focused Attention: Appears intact Sustained Attention: Impaired Sustained Attention Impairment: Verbal basic;Functional basic Memory: Impaired Memory Impairment: Decreased recall of new information Awareness: Impaired Awareness Impairment: Intellectual impairment Problem Solving: Impaired Problem Solving Impairment: Verbal basic;Functional basic Executive Function:  (all impaired due to lower level deficits) Behaviors: Impulsive;Verbal agitation;Poor frustration tolerance Safety/Judgment: Impaired [employer] Scales of Cognitive Functioning: Confused/appropriate Sensation Sensation Light Touch: Appears Intact Hot/Cold: Appears Intact Proprioception: Appears Intact Coordination Gross Motor Movements are Fluid and Coordinated: No Fine Motor Movements are Fluid and Coordinated: Yes Coordination and  Movement Description: ataxic, control of movement and balance improves with active participation and attention to task  Motor  Motor Motor: Ataxia  Mobility Bed Mobility Bed Mobility: Supine to Sit;Sit to Supine Supine to Sit: 6: Modified independent (Device/Increase time);HOB flat Sit to Supine: 6: Modified independent (Device/Increase time);HOB flat Transfers Transfers: Yes Sit to Stand: 5: Supervision Stand to Sit: 5: Supervision Locomotion  Ambulation Ambulation: Yes Ambulation/Gait Assistance: 4: Min assist Ambulation Distance (Feet): 200 Feet Assistive device: Rolling walker Ambulation/Gait Assistance Details:  Verbal cues for precautions/safety;Verbal cues for safe use of DME/AE Gait Gait: Yes Gait Pattern: Impaired Gait Pattern: Step-through pattern;Decreased stride length;Ataxic (drifting to R and L ) Gait velocity: 10 MWT = 0.67 m/s using RW Stairs / Additional Locomotion Stairs: Yes Stairs Assistance: 4: Min assist Stairs Assistance Details: Verbal cues for precautions/safety Stair Management Technique: Two rails;Forwards;Step to pattern Number of Stairs: 12 Height of Stairs: 6 Ramp: Not tested (comment) Curb: Not tested (comment) Wheelchair Mobility Wheelchair Mobility: No (pt ambulatory)  Trunk/Postural Assessment  Cervical Assessment Cervical Assessment: Within Functional Limits Thoracic Assessment Thoracic Assessment: Within Functional Limits Lumbar Assessment Lumbar Assessment: Within Functional Limits Postural Control Postural Control: Deficits on evaluation Righting Reactions: impaired/delayed  Balance Balance Balance Assessed: Yes Standardized Balance Assessment Standardized Balance Assessment: Berg Balance Test Berg Balance Test Sit to Stand: Able to stand without using hands and stabilize independently Standing Unsupported: Able to stand 2 minutes with supervision Sitting with Back Unsupported but Feet Supported on Floor or Stool: Able to sit  safely and securely 2 minutes Stand to Sit: Sits independently, has uncontrolled descent Transfers: Able to transfer with verbal cueing and /or supervision Standing Unsupported with Eyes Closed: Able to stand 10 seconds with supervision Standing Ubsupported with Feet Together: Able to place feet together independently but unable to hold for 30 seconds From Standing, Reach Forward with Outstretched Arm: Loses balance while trying/requires external support (refused) From Standing Position, Pick up Object from Floor: Unable to try/needs assist to keep balance (refused) From Standing Position, Turn to Look Behind Over each Shoulder: Needs supervision when turning Turn 360 Degrees: Needs assistance while turning (refused) Standing Unsupported, Alternately Place Feet on Step/Stool: Needs assistance to keep from falling or unable to try (refused) Standing Unsupported, One Foot in Front: Loses balance while stepping or standing (refused) Standing on One Leg: Unable to try or needs assist to prevent fall (refused) Total Score: 20 Static Standing Balance Static Standing - Balance Support: During functional activity;Bilateral upper extremity supported Static Standing - Level of Assistance: 5: Stand by assistance Dynamic Standing Balance Dynamic Standing - Balance Support: During functional activity;Bilateral upper extremity supported Dynamic Standing - Level of Assistance: 4: Min assist Extremity Assessment  RUE Assessment RUE Assessment: Within Functional Limits LUE Assessment LUE Assessment: Within Functional Limits RLE Assessment RLE Assessment: Within Functional Limits (not formally assessed due to cognition) LLE Assessment LLE Assessment: Within Functional Limits (not formally assessed due to cognition)  See FIM for current functional status  Laretta Alstrom 02/12/2015, 12:31 PM

## 2015-02-12 NOTE — Discharge Summary (Signed)
Discharge summary job [id number]
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
## 2015-02-13 DIAGNOSIS — S06309D Unspecified focal traumatic brain injury with loss of consciousness of unspecified duration, subsequent encounter: Secondary | ICD-10-CM

## 2015-02-13 DIAGNOSIS — S0291XK Unspecified fracture of skull, subsequent encounter for fracture with nonunion: Secondary | ICD-10-CM

## 2015-02-13 MED ORDER — QUETIAPINE FUMARATE 25 MG PO TABS: 25.0000 mg | ORAL_TABLET | Freq: Two times a day (BID) | ORAL | Status: DC

## 2015-02-13 MED ORDER — PANTOPRAZOLE SODIUM 40 MG PO TBEC: 40.0000 mg | DELAYED_RELEASE_TABLET | Freq: Every day | ORAL | Status: DC

## 2015-02-13 MED ORDER — DIVALPROEX SODIUM 125 MG PO CSDR: 500.0000 mg | DELAYED_RELEASE_CAPSULE | Freq: Two times a day (BID) | ORAL | Status: DC

## 2015-02-13 NOTE — Plan of Care (Signed)
Problem: RH Memory Goal: LTG Patient demonstrate ability for day to day recall (PT) LTG: Patient will demonstrate ability for day to day recall/carryover during mobility activities with assist (PT)  Outcome: Not Met (add Reason) Patient continues to require max assist for cognitive deficits. Anticipate patient's progress limited by lack of participation.   Problem: RH Attention Goal: LTG Patient will demonstrate focused/sustained (PT) LTG: Patient will demonstrate focused/sustained/selective/alternating/divided attention during functional mobility in specific environment with assist for # of minutes (PT)  Outcome: Not Met (add Reason) Patient continues to require max assist for cognitive deficits. Anticipate patient's progress limited by lack of participation.   Problem: RH Awareness Goal: LTG: Patient will demonstrate intellectual/emergent (PT) LTG: Patient will demonstrate intellectual/emergent/anticipatory awareness with assist during a mobility activity (PT)  Outcome: Not Met (add Reason) Patient continues to require max assist for cognitive deficits. Anticipate patient's progress limited by lack of participation.
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
## 2015-02-13 NOTE — Progress Notes (Signed)
Pt's discharged home with family. Discharge instructions provided by Harvel Ricksan Anguilli, PA. All questions answered. Family verbalized understanding. Pt escorted off unit in w/c with personal belonging by [place], NT.

## 2015-02-16 ENCOUNTER — Inpatient Hospital Stay (HOSPITAL_COMMUNITY): Payer: Medicaid Other | Admitting: Speech Pathology

## 2015-02-17 ENCOUNTER — Encounter: Payer: Self-pay | Admitting: Family Medicine

## 2015-02-17 ENCOUNTER — Ambulatory Visit: Payer: Medicaid Other | Attending: Family Medicine | Admitting: Family Medicine

## 2015-02-17 VITALS — BP 113/81 | HR 106 | Temp 98.0°F | Resp 18 | Ht 64.0 in | Wt 144.0 lb

## 2015-02-17 DIAGNOSIS — Z79899 Other long term (current) drug therapy: Secondary | ICD-10-CM | POA: Diagnosis not present

## 2015-02-17 DIAGNOSIS — S06309D Unspecified focal traumatic brain injury with loss of consciousness of unspecified duration, subsequent encounter: Secondary | ICD-10-CM

## 2015-02-17 DIAGNOSIS — S069X9A Unspecified intracranial injury with loss of consciousness of unspecified duration, initial encounter: Secondary | ICD-10-CM | POA: Diagnosis present

## 2015-02-17 DIAGNOSIS — S069XAA Unspecified intracranial injury with loss of consciousness status unknown, initial encounter: Secondary | ICD-10-CM | POA: Insufficient documentation

## 2015-02-17 DIAGNOSIS — S0291XK Unspecified fracture of skull, subsequent encounter for fracture with nonunion: Secondary | ICD-10-CM | POA: Diagnosis not present

## 2015-02-17 DIAGNOSIS — R4702 Dysphasia: Secondary | ICD-10-CM | POA: Diagnosis not present

## 2015-02-17 DIAGNOSIS — T148 Other injury of unspecified body region: Secondary | ICD-10-CM | POA: Diagnosis not present

## 2015-02-17 DIAGNOSIS — W3400XA Accidental discharge from unspecified firearms or gun, initial encounter: Secondary | ICD-10-CM | POA: Insufficient documentation

## 2015-02-17 DIAGNOSIS — S062X9D Diffuse traumatic brain injury with loss of consciousness of unspecified duration, subsequent encounter: Secondary | ICD-10-CM

## 2015-02-17 DIAGNOSIS — Z87891 Personal history of nicotine dependence: Secondary | ICD-10-CM | POA: Diagnosis not present
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
## 2015-02-18 ENCOUNTER — Telehealth: Payer: Self-pay | Admitting: *Deleted
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
## 2015-02-18 NOTE — Telephone Encounter (Signed)
Clinical indication is TBI with behavioral/cognitive disturbances post TBI. It is to be continued to help control her agitated behavior

## 2015-03-04 ENCOUNTER — Encounter: Payer: Self-pay | Admitting: Internal Medicine

## 2015-03-04 ENCOUNTER — Ambulatory Visit: Payer: Medicaid Other | Attending: Internal Medicine | Admitting: Internal Medicine

## 2015-03-04 VITALS — BP 111/75 | HR 97 | Temp 98.0°F | Ht 64.0 in | Wt 150.0 lb

## 2015-03-04 DIAGNOSIS — R4701 Aphasia: Secondary | ICD-10-CM | POA: Diagnosis not present

## 2015-03-04 DIAGNOSIS — R531 Weakness: Secondary | ICD-10-CM

## 2015-03-04 DIAGNOSIS — S069X0S Unspecified intracranial injury without loss of consciousness, sequela: Secondary | ICD-10-CM | POA: Insufficient documentation

## 2015-03-04 DIAGNOSIS — M6289 Other specified disorders of muscle: Secondary | ICD-10-CM | POA: Diagnosis not present

## 2015-03-04 DIAGNOSIS — H532 Diplopia: Secondary | ICD-10-CM | POA: Diagnosis not present

## 2015-03-04 DIAGNOSIS — Z87891 Personal history of nicotine dependence: Secondary | ICD-10-CM | POA: Diagnosis not present

## 2015-03-04 DIAGNOSIS — L304 Erythema intertrigo: Secondary | ICD-10-CM | POA: Diagnosis not present

## 2015-03-04 MED ORDER — QUETIAPINE FUMARATE 25 MG PO TABS: 25.0000 mg | ORAL_TABLET | Freq: Two times a day (BID) | ORAL | Status: DC

## 2015-03-04 MED ORDER — DIVALPROEX SODIUM 125 MG PO CSDR: 500.0000 mg | DELAYED_RELEASE_CAPSULE | Freq: Two times a day (BID) | ORAL | Status: DC

## 2015-03-04 MED ORDER — KETOCONAZOLE 2 % EX CREA: 1.0000 "application " | TOPICAL_CREAM | Freq: Every day | CUTANEOUS | Status: DC
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
## 2015-03-04 NOTE — Patient Instructions (Signed)
I will call you after I receive a message back from Dr. Riley KillSwartz

## 2015-03-06 ENCOUNTER — Telehealth: Payer: Self-pay | Admitting: *Deleted

## 2015-03-06 NOTE — Telephone Encounter (Signed)
That would be fine. Thanks---she is brain injury patient----thought we ordered that long ago!

## 2015-03-06 NOTE — Telephone Encounter (Signed)
Trey Paula notified and given verbal order for ST eval and treat.

## 2015-03-06 NOTE — Telephone Encounter (Signed)
Leah Rojas from [facility] called and requested an order for a Speech Therapy Consultation.  Patient is having cognitive and speech issues

## 2015-03-06 NOTE — Telephone Encounter (Signed)
FYI.Marland KitchenMarland KitchenMarland KitchenMarland Kitchenpatient was not seen this week by home health.  They are awaiting Medicaid authorization. As soon as it comes in they will begin visits.  They have already completed initial evaluation

## 2015-03-09 ENCOUNTER — Other Ambulatory Visit: Payer: Self-pay | Admitting: Internal Medicine

## 2015-03-13 ENCOUNTER — Telehealth: Payer: Self-pay | Admitting: Internal Medicine

## 2015-03-13 NOTE — Telephone Encounter (Signed)
Please see previous note.

## 2015-03-13 NOTE — Telephone Encounter (Signed)
Pt's mom calling to report pt's arm is still sore and is requesting refill for ketoconazole (NIZORAL) 2 % cream. Please f/u with pt asap.

## 2015-03-16 DIAGNOSIS — S06899D Other specified intracranial injury with loss of consciousness of unspecified duration, subsequent encounter: Secondary | ICD-10-CM | POA: Diagnosis not present

## 2015-03-16 DIAGNOSIS — R1310 Dysphagia, unspecified: Secondary | ICD-10-CM | POA: Diagnosis not present

## 2015-03-16 DIAGNOSIS — W3400XD Accidental discharge from unspecified firearms or gun, subsequent encounter: Secondary | ICD-10-CM | POA: Diagnosis not present

## 2015-03-16 DIAGNOSIS — R41844 Frontal lobe and executive function deficit: Secondary | ICD-10-CM | POA: Diagnosis not present

## 2015-03-16 NOTE — Telephone Encounter (Signed)
Find out if the cream has helped any. If not switch to Nystatin ex cream to use twice per day.

## 2015-03-16 NOTE — Telephone Encounter (Signed)
Voicemail is to full to leave a message.

## 2015-03-16 NOTE — Telephone Encounter (Signed)
Attempted to call patient voicemail is to full.

## 2015-03-19 ENCOUNTER — Telehealth: Payer: Self-pay | Admitting: *Deleted
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
## 2015-03-20 NOTE — Telephone Encounter (Signed)
Spoke with the mothe and gave her Dr Rosalyn Charters recommendations.  She understands but if she feels that Leah Rojas needs to be checked out she will take her back to the hospital.

## 2015-03-20 NOTE — Telephone Encounter (Signed)
This is something normal to expect in the course of recovery. Leah Rojas needs to get some rest, avoid overstimulation. Can take tylenol 500mg  or ibuprofen 400-600mg  as needed for pain.

## 2015-03-22 ENCOUNTER — Other Ambulatory Visit: Payer: Self-pay | Admitting: Internal Medicine

## 2015-03-25 ENCOUNTER — Encounter: Payer: Medicaid Other | Attending: Physical Medicine & Rehabilitation | Admitting: Physical Medicine & Rehabilitation

## 2015-03-25 ENCOUNTER — Encounter: Payer: Self-pay | Admitting: Physical Medicine & Rehabilitation

## 2015-03-25 VITALS — BP 124/57 | HR 87 | Resp 14

## 2015-03-25 DIAGNOSIS — S069X0S Unspecified intracranial injury without loss of consciousness, sequela: Secondary | ICD-10-CM

## 2015-03-25 DIAGNOSIS — W3400XA Accidental discharge from unspecified firearms or gun, initial encounter: Secondary | ICD-10-CM | POA: Diagnosis not present

## 2015-03-25 DIAGNOSIS — F09 Unspecified mental disorder due to known physiological condition: Secondary | ICD-10-CM | POA: Insufficient documentation

## 2015-03-25 DIAGNOSIS — S069X5S Unspecified intracranial injury with loss of consciousness greater than 24 hours with return to pre-existing conscious level, sequela: Secondary | ICD-10-CM | POA: Insufficient documentation

## 2015-03-25 DIAGNOSIS — T148 Other injury of unspecified body region: Secondary | ICD-10-CM | POA: Diagnosis not present

## 2015-03-25 DIAGNOSIS — S0990XS Unspecified injury of head, sequela: Secondary | ICD-10-CM

## 2015-03-25 DIAGNOSIS — R21 Rash and other nonspecific skin eruption: Secondary | ICD-10-CM | POA: Insufficient documentation

## 2015-03-25 MED ORDER — DIVALPROEX SODIUM 500 MG PO DR TAB: 500.0000 mg | DELAYED_RELEASE_TABLET | Freq: Two times a day (BID) | ORAL | Status: DC

## 2015-03-25 MED ORDER — QUETIAPINE FUMARATE 25 MG PO TABS: 25.0000 mg | ORAL_TABLET | Freq: Two times a day (BID) | ORAL | Status: DC
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
## 2015-03-25 NOTE — Patient Instructions (Signed)
SARNA LOTION TO THE ARM PITS.   ALSO TRY TO KEEP THEM DRY      TARGET 8 HOUR+ SLEEP PER NIGHT  WHEN YOU FEEL TIRED OR OVERWHELMED DURING AN ACTIVITY OR WHEN YOU'RE WITH SOMEONE ELSE----BACK OFF WHAT YOU'RE DOING---STEP BACK

## 2015-04-08 ENCOUNTER — Telehealth: Payer: Self-pay | Admitting: *Deleted

## 2015-04-08 NOTE — Telephone Encounter (Signed)
[name] [facility] request one additional visit for next week with Leah Rojas.  Approval given.

## 2015-04-22 ENCOUNTER — Other Ambulatory Visit: Payer: Self-pay | Admitting: *Deleted

## 2015-04-22 DIAGNOSIS — W3400XA Accidental discharge from unspecified firearms or gun, initial encounter: Secondary | ICD-10-CM

## 2015-04-22 DIAGNOSIS — S0990XS Unspecified injury of head, sequela: Secondary | ICD-10-CM

## 2015-04-22 DIAGNOSIS — F09 Unspecified mental disorder due to known physiological condition: Secondary | ICD-10-CM

## 2015-04-22 DIAGNOSIS — S069X0S Unspecified intracranial injury without loss of consciousness, sequela: Secondary | ICD-10-CM

## 2015-04-22 DIAGNOSIS — S069X5S Unspecified intracranial injury with loss of consciousness greater than 24 hours with return to pre-existing conscious level, sequela: Secondary | ICD-10-CM

## 2015-04-22 MED ORDER — QUETIAPINE FUMARATE 25 MG PO TABS: 25.0000 mg | ORAL_TABLET | Freq: Two times a day (BID) | ORAL | Status: DC

## 2015-05-27 ENCOUNTER — Encounter: Payer: Medicaid Other | Attending: Physical Medicine & Rehabilitation | Admitting: Physical Medicine & Rehabilitation

## 2015-05-27 ENCOUNTER — Encounter: Payer: Self-pay | Admitting: Physical Medicine & Rehabilitation

## 2015-05-27 VITALS — BP 113/58 | HR 74 | Resp 14

## 2015-05-27 DIAGNOSIS — T148 Other injury of unspecified body region: Secondary | ICD-10-CM | POA: Insufficient documentation

## 2015-05-27 DIAGNOSIS — F09 Unspecified mental disorder due to known physiological condition: Secondary | ICD-10-CM | POA: Insufficient documentation

## 2015-05-27 DIAGNOSIS — S0990XS Unspecified injury of head, sequela: Secondary | ICD-10-CM

## 2015-05-27 DIAGNOSIS — Z5181 Encounter for therapeutic drug level monitoring: Secondary | ICD-10-CM

## 2015-05-27 DIAGNOSIS — S069X0S Unspecified intracranial injury without loss of consciousness, sequela: Secondary | ICD-10-CM | POA: Diagnosis not present

## 2015-05-27 DIAGNOSIS — S069X5S Unspecified intracranial injury with loss of consciousness greater than 24 hours with return to pre-existing conscious level, sequela: Secondary | ICD-10-CM | POA: Diagnosis not present

## 2015-05-27 DIAGNOSIS — S069X4D Unspecified intracranial injury with loss of consciousness of 6 hours to 24 hours, subsequent encounter: Secondary | ICD-10-CM

## 2015-05-27 DIAGNOSIS — R21 Rash and other nonspecific skin eruption: Secondary | ICD-10-CM | POA: Insufficient documentation

## 2015-05-27 DIAGNOSIS — W3400XA Accidental discharge from unspecified firearms or gun, initial encounter: Secondary | ICD-10-CM

## 2015-05-27 LAB — COMPREHENSIVE METABOLIC PANEL
ALBUMIN: 3.8 g/dL (ref 3.6–5.1)
ALT: 11 U/L (ref 6–29)
AST: 13 U/L (ref 10–30)
Alkaline Phosphatase: 42 U/L (ref 33–115)
BUN: 19 mg/dL (ref 7–25)
CALCIUM: 8.9 mg/dL (ref 8.6–10.2)
CHLORIDE: 104 mmol/L (ref 98–110)
CO2: 25 mmol/L (ref 20–31)
Creat: 0.71 mg/dL (ref 0.50–1.10)
Glucose, Bld: 77 mg/dL (ref 65–99)
Potassium: 4.1 mmol/L (ref 3.5–5.3)
SODIUM: 140 mmol/L (ref 135–146)
TOTAL PROTEIN: 6.8 g/dL (ref 6.1–8.1)
Total Bilirubin: 0.2 mg/dL (ref 0.2–1.2)

## 2015-05-27 LAB — VALPROIC ACID LEVEL: Valproic Acid Lvl: 67.5 ug/mL (ref 50.0–100.0)

## 2015-05-27 MED ORDER — QUETIAPINE FUMARATE 25 MG PO TABS: 25.0000 mg | ORAL_TABLET | Freq: Two times a day (BID) | ORAL | Status: DC

## 2015-05-27 MED ORDER — ACETAMINOPHEN-CODEINE #3 300-30 MG PO TABS: 1.0000 | ORAL_TABLET | Freq: Every day | ORAL | Status: DC | PRN
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
## 2015-05-27 NOTE — Patient Instructions (Signed)
WORK ON EXERCISES AT HOME THAT PUSH  YOU READING AND MATH SKILLS   THINK ABOUT WHERE YOUR BODY IS IN SPACE AND WORK ON KEEPING YOURSELF IN LINE WHEN YOU WALK ( DON'T DRIFT LEFT)

## 2015-05-28 ENCOUNTER — Telehealth: Payer: Self-pay | Admitting: Physical Medicine & Rehabilitation

## 2015-05-28 NOTE — Telephone Encounter (Signed)
Called mother and notified her of lab results. No changes to med regimen.

## 2015-06-04 ENCOUNTER — Telehealth: Payer: Self-pay | Admitting: *Deleted

## 2015-06-04 NOTE — Telephone Encounter (Signed)
Stevey' mother called about a medication that needs Dr Riley Kill to call medicaid for approval.  I called and spoke with her and it looks like it was a prior auth needed for her seroquel which was submitted on 06/03/15 by Purvis Sheffield CMA.  I told her it can take up to 72 hours for Medicaid to make a decision on med.  She should check with pharmacy tomorrow.

## 2015-06-08 ENCOUNTER — Telehealth: Payer: Self-pay | Admitting: Physical Medicine & Rehabilitation

## 2015-06-08 NOTE — Telephone Encounter (Signed)
Patient's mother called stating the pharmacy has been giving her free samples of the Seroquel because medicaid has not approved it yet. I told her to have the pharmacy try again later this afternoon and if we receive any notification we will call her to let her know.

## 2015-07-27 ENCOUNTER — Encounter: Payer: Medicaid Other | Admitting: Physical Medicine & Rehabilitation

## 2015-08-10 ENCOUNTER — Encounter: Payer: Self-pay | Admitting: Physical Medicine & Rehabilitation

## 2015-08-10 ENCOUNTER — Encounter: Payer: Medicaid Other | Attending: Physical Medicine & Rehabilitation | Admitting: Physical Medicine & Rehabilitation

## 2015-08-10 VITALS — BP 102/64 | HR 72

## 2015-08-10 DIAGNOSIS — S06306S Unspecified focal traumatic brain injury with loss of consciousness greater than 24 hours without return to pre-existing conscious level with patient surviving, sequela: Secondary | ICD-10-CM

## 2015-08-10 DIAGNOSIS — S069X5S Unspecified intracranial injury with loss of consciousness greater than 24 hours with return to pre-existing conscious level, sequela: Secondary | ICD-10-CM | POA: Insufficient documentation

## 2015-08-10 DIAGNOSIS — T148 Other injury of unspecified body region: Secondary | ICD-10-CM | POA: Diagnosis not present

## 2015-08-10 DIAGNOSIS — R21 Rash and other nonspecific skin eruption: Secondary | ICD-10-CM | POA: Diagnosis not present

## 2015-08-10 DIAGNOSIS — S069X0S Unspecified intracranial injury without loss of consciousness, sequela: Secondary | ICD-10-CM

## 2015-08-10 DIAGNOSIS — S0990XS Unspecified injury of head, sequela: Secondary | ICD-10-CM

## 2015-08-10 DIAGNOSIS — F918 Other conduct disorders: Secondary | ICD-10-CM

## 2015-08-10 DIAGNOSIS — F09 Unspecified mental disorder due to known physiological condition: Secondary | ICD-10-CM | POA: Insufficient documentation

## 2015-08-10 DIAGNOSIS — R4689 Other symptoms and signs involving appearance and behavior: Secondary | ICD-10-CM

## 2015-08-10 MED ORDER — CITALOPRAM HYDROBROMIDE 10 MG PO TABS: 10.0000 mg | ORAL_TABLET | Freq: Every day | ORAL | Status: DC

## 2015-08-10 NOTE — Patient Instructions (Signed)
DROP TY E DAY TIME SEROQUEL STARTING TODAY IN ONE WEEK CUT THE NIGHT TIME SEROQUEL TO 12.5MG  THEN ONE WEEK LATER STOP THE SEROQUEL----ONLY USE IT AS NEEDED
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
## 2015-08-26 ENCOUNTER — Telehealth: Payer: Self-pay | Admitting: Physical Medicine & Rehabilitation

## 2015-08-26 NOTE — Telephone Encounter (Signed)
Please advise 

## 2015-08-26 NOTE — Telephone Encounter (Signed)
Left message for family to call back

## 2015-08-26 NOTE — Telephone Encounter (Signed)
Patient has a bad cold and would like to know what to take.  Also, mother would like to know why patient is seeing a psychiatrist.  Please call.

## 2015-08-26 NOTE — Telephone Encounter (Signed)
She may take robitussin for cough/cold.   As far as the second question: she is not seeing a psychiatrist--it's a psychologist. Read below  4. Mood/agitation:  -Depakote for mood stabilization/sz-- 500mg  bid  -seroquel taper behavior permitting (may not be able to yet)----instructions given -will initiate celexa 20mg  for depression -will make referral to Dr. Eula FlaxMichael Zelson for behavioral mgt/coping skills related to lability and depression. Family needs help too in understanding better. I am happy to speak with any of them further. -behavior is gradually improving however---I reassured the patient in this regard and that she may see further improvement over the next several months.

## 2015-09-01 ENCOUNTER — Ambulatory Visit: Payer: Medicaid Other | Admitting: Psychology

## 2015-09-10 ENCOUNTER — Telehealth: Payer: Self-pay | Admitting: Psychology

## 2015-09-10 NOTE — Telephone Encounter (Signed)
Also, her godmother is aware that I have contacted Ms. Vandermeer about an appointment.

## 2015-09-10 NOTE — Telephone Encounter (Signed)
[name] cancelled her initial appointment with me on 09/01/15 due to illness. She told staff she would call back to reschedule but had not done so within the past nine days. I called her today to check if she wished to reschedule her appointment. She said she would but has to line up transportation first. She said she would call back.

## 2015-09-29 ENCOUNTER — Encounter (HOSPITAL_COMMUNITY): Payer: Self-pay | Admitting: *Deleted

## 2015-09-29 DIAGNOSIS — Z87828 Personal history of other (healed) physical injury and trauma: Secondary | ICD-10-CM | POA: Insufficient documentation

## 2015-09-29 DIAGNOSIS — Z79899 Other long term (current) drug therapy: Secondary | ICD-10-CM | POA: Diagnosis not present

## 2015-09-29 DIAGNOSIS — Z87891 Personal history of nicotine dependence: Secondary | ICD-10-CM | POA: Diagnosis not present

## 2015-09-29 DIAGNOSIS — G4489 Other headache syndrome: Secondary | ICD-10-CM | POA: Diagnosis not present

## 2015-09-29 DIAGNOSIS — J0100 Acute maxillary sinusitis, unspecified: Secondary | ICD-10-CM | POA: Insufficient documentation

## 2015-09-29 DIAGNOSIS — Z3202 Encounter for pregnancy test, result negative: Secondary | ICD-10-CM | POA: Insufficient documentation

## 2015-09-29 DIAGNOSIS — R51 Headache: Secondary | ICD-10-CM | POA: Diagnosis present
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
## 2015-09-30 ENCOUNTER — Emergency Department (HOSPITAL_COMMUNITY): Payer: Medicaid Other

## 2015-09-30 ENCOUNTER — Emergency Department (HOSPITAL_COMMUNITY)
Admission: EM | Admit: 2015-09-30 | Discharge: 2015-09-30 | Disposition: A | Discharge status: Home / Self Care | Payer: Medicaid Other | Attending: Emergency Medicine | Admitting: Emergency Medicine

## 2015-09-30 DIAGNOSIS — G4489 Other headache syndrome: Secondary | ICD-10-CM

## 2015-09-30 DIAGNOSIS — J0100 Acute maxillary sinusitis, unspecified: Secondary | ICD-10-CM

## 2015-09-30 LAB — I-STAT BETA HCG BLOOD, ED (MC, WL, AP ONLY): I-stat hCG, quantitative: <5 m[IU]/mL (ref <5)

## 2015-09-30 MED ORDER — DIPHENHYDRAMINE HCL 50 MG/ML IJ SOLN
25.0000 mg | Freq: Once | INTRAMUSCULAR | Status: AC
  Administered 2015-09-30: 25 mg via INTRAVENOUS
  Filled 2015-09-30: qty 1

## 2015-09-30 MED ORDER — AMOXICILLIN-POT CLAVULANATE 875-125 MG PO TABS: 1.0000 | ORAL_TABLET | Freq: Two times a day (BID) | ORAL | Status: DC

## 2015-09-30 MED ORDER — SODIUM CHLORIDE 0.9 % IV BOLUS (SEPSIS)
1000.0000 mL | Freq: Once | INTRAVENOUS | Status: AC
  Administered 2015-09-30: 1000 mL via INTRAVENOUS

## 2015-09-30 MED ORDER — PROCHLORPERAZINE EDISYLATE 5 MG/ML IJ SOLN
10.0000 mg | Freq: Once | INTRAMUSCULAR | Status: AC
  Administered 2015-09-30: 10 mg via INTRAVENOUS
  Filled 2015-09-30: qty 2

## 2015-09-30 NOTE — Discharge Instructions (Signed)
Sinus Headache  A sinus headache happens when your sinuses become clogged or swollen. You may feel pain or pressure in your face, forehead, ears, or upper teeth. Sinus headaches can be mild or severe.  HOME CARE  · Take medicines only as told by your doctor.  · If you were given an antibiotic medicine, finish all of it even if you start to feel better.  · Use a nose spray if you feel stuffed up (congested).  · If told, apply a warm, moist washcloth to your face to help lessen pain.  GET HELP IF:  · You get headaches more than one time each week.  · Light or sound bothers you.  · You have a fever.  · You feel sick to your stomach (nauseous) or you throw up (vomit).  · Your headaches do not get better with treatment.  GET HELP RIGHT AWAY IF:  · You have trouble seeing.  · You suddenly have very bad pain in your face or head.  · You start to twitch or shake (seizure).  · You are confused.  · You have a stiff neck.     This information is not intended to replace advice given to you by your health care provider. Make sure you discuss any questions you have with your health care provider.     Document Released: 01/12/2011 Document Revised: 01/27/2015 Document Reviewed: 09/08/2014  Elsevier Interactive Patient Education ©2016 [employer]
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
## 2015-10-07 ENCOUNTER — Encounter: Payer: Medicaid Other | Attending: Physical Medicine & Rehabilitation | Admitting: Physical Medicine & Rehabilitation

## 2015-10-07 ENCOUNTER — Encounter: Payer: Self-pay | Admitting: Physical Medicine & Rehabilitation

## 2015-10-07 VITALS — BP 111/69 | HR 69 | Resp 16

## 2015-10-07 DIAGNOSIS — S069X4S Unspecified intracranial injury with loss of consciousness of 6 hours to 24 hours, sequela: Secondary | ICD-10-CM

## 2015-10-07 DIAGNOSIS — R21 Rash and other nonspecific skin eruption: Secondary | ICD-10-CM | POA: Diagnosis not present

## 2015-10-07 DIAGNOSIS — S06306S Unspecified focal traumatic brain injury with loss of consciousness greater than 24 hours without return to pre-existing conscious level with patient surviving, sequela: Secondary | ICD-10-CM | POA: Diagnosis not present

## 2015-10-07 DIAGNOSIS — F09 Unspecified mental disorder due to known physiological condition: Secondary | ICD-10-CM | POA: Diagnosis not present

## 2015-10-07 DIAGNOSIS — F918 Other conduct disorders: Secondary | ICD-10-CM | POA: Diagnosis not present

## 2015-10-07 DIAGNOSIS — S069X5S Unspecified intracranial injury with loss of consciousness greater than 24 hours with return to pre-existing conscious level, sequela: Secondary | ICD-10-CM | POA: Diagnosis present

## 2015-10-07 DIAGNOSIS — S0990XS Unspecified injury of head, sequela: Secondary | ICD-10-CM

## 2015-10-07 DIAGNOSIS — T148 Other injury of unspecified body region: Secondary | ICD-10-CM | POA: Diagnosis not present

## 2015-10-07 DIAGNOSIS — R4689 Other symptoms and signs involving appearance and behavior: Secondary | ICD-10-CM

## 2015-10-07 DIAGNOSIS — S069X0S Unspecified intracranial injury without loss of consciousness, sequela: Secondary | ICD-10-CM

## 2015-10-07 NOTE — Patient Instructions (Signed)
Dr. Leonides CaveZelson will contact you about an appointment
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
## 2015-10-20 ENCOUNTER — Encounter: Payer: Self-pay | Admitting: Gastroenterology

## 2015-10-29 ENCOUNTER — Emergency Department (HOSPITAL_COMMUNITY)
Admission: EM | Admit: 2015-10-29 | Discharge: 2015-10-29 | Disposition: A | Discharge status: Home / Self Care | Payer: Medicaid Other | Attending: Emergency Medicine | Admitting: Emergency Medicine

## 2015-10-29 ENCOUNTER — Encounter (HOSPITAL_COMMUNITY): Payer: Self-pay

## 2015-10-29 ENCOUNTER — Emergency Department (HOSPITAL_COMMUNITY): Payer: Medicaid Other

## 2015-10-29 DIAGNOSIS — R112 Nausea with vomiting, unspecified: Secondary | ICD-10-CM | POA: Insufficient documentation

## 2015-10-29 DIAGNOSIS — Z87891 Personal history of nicotine dependence: Secondary | ICD-10-CM | POA: Diagnosis not present

## 2015-10-29 DIAGNOSIS — Z3202 Encounter for pregnancy test, result negative: Secondary | ICD-10-CM | POA: Insufficient documentation

## 2015-10-29 DIAGNOSIS — Z79899 Other long term (current) drug therapy: Secondary | ICD-10-CM | POA: Diagnosis not present

## 2015-10-29 DIAGNOSIS — N390 Urinary tract infection, site not specified: Secondary | ICD-10-CM | POA: Diagnosis not present

## 2015-10-29 DIAGNOSIS — S069X4S Unspecified intracranial injury with loss of consciousness of 6 hours to 24 hours, sequela: Secondary | ICD-10-CM | POA: Diagnosis not present

## 2015-10-29 DIAGNOSIS — Z8709 Personal history of other diseases of the respiratory system: Secondary | ICD-10-CM | POA: Insufficient documentation

## 2015-10-29 DIAGNOSIS — R531 Weakness: Secondary | ICD-10-CM | POA: Insufficient documentation

## 2015-10-29 DIAGNOSIS — W3400XS Accidental discharge from unspecified firearms or gun, sequela: Secondary | ICD-10-CM | POA: Diagnosis not present

## 2015-10-29 LAB — URINALYSIS, ROUTINE W REFLEX MICROSCOPIC
BILIRUBIN URINE: NEGATIVE
Glucose, UA: NEGATIVE mg/dL
KETONES UR: 15 mg/dL — AB
NITRITE: POSITIVE — AB
Protein, ur: 30 mg/dL — AB
Specific Gravity, Urine: 1.03 (ref 1.005–1.030)
pH: 5.5 (ref 5.0–8.0)

## 2015-10-29 LAB — COMPREHENSIVE METABOLIC PANEL
ALBUMIN: 3.9 g/dL (ref 3.5–5.0)
ALT: 10 U/L — ABNORMAL LOW (ref 14–54)
AST: 17 U/L (ref 15–41)
Alkaline Phosphatase: 49 U/L (ref 38–126)
Anion gap: 12 (ref 5–15)
BILIRUBIN TOTAL: 0.4 mg/dL (ref 0.3–1.2)
BUN: 18 mg/dL (ref 6–20)
CO2: 24 mmol/L (ref 22–32)
Calcium: 9.2 mg/dL (ref 8.9–10.3)
Chloride: 104 mmol/L (ref 101–111)
Creatinine, Ser: 0.7 mg/dL (ref 0.44–1.00)
GFR calc Af Amer: >60 mL/min (ref >60)
GFR calc non Af Amer: >60 mL/min (ref >60)
GLUCOSE: 145 mg/dL — AB (ref 65–99)
POTASSIUM: 3.8 mmol/L (ref 3.5–5.1)
SODIUM: 140 mmol/L (ref 135–145)
TOTAL PROTEIN: 7.3 g/dL (ref 6.5–8.1)

## 2015-10-29 LAB — CBC
HEMATOCRIT: 37 % (ref 36.0–46.0)
Hemoglobin: 12.5 g/dL (ref 12.0–15.0)
MCH: 31.5 pg (ref 26.0–34.0)
MCHC: 33.8 g/dL (ref 30.0–36.0)
MCV: 93.2 fL (ref 78.0–100.0)
Platelets: 311 10*3/uL (ref 150–400)
RBC: 3.97 MIL/uL (ref 3.87–5.11)
RDW: 12.9 % (ref 11.5–15.5)
WBC: 6.8 10*3/uL (ref 4.0–10.5)

## 2015-10-29 LAB — URINE MICROSCOPIC-ADD ON

## 2015-10-29 LAB — LIPASE, BLOOD: Lipase: 37 U/L (ref 11–51)

## 2015-10-29 LAB — POC URINE PREG, ED: Preg Test, Ur: NEGATIVE

## 2015-10-29 MED ORDER — DEXTROSE 5 % IV SOLN
1.0000 g | Freq: Once | INTRAVENOUS | Status: AC
  Administered 2015-10-29: 1 g via INTRAVENOUS
  Filled 2015-10-29: qty 10

## 2015-10-29 MED ORDER — ONDANSETRON HCL 4 MG PO TABS: 4.0000 mg | ORAL_TABLET | Freq: Three times a day (TID) | ORAL | Status: DC | PRN

## 2015-10-29 MED ORDER — SODIUM CHLORIDE 0.9 % IV BOLUS (SEPSIS)
1000.0000 mL | Freq: Once | INTRAVENOUS | Status: AC
  Administered 2015-10-29: 1000 mL via INTRAVENOUS

## 2015-10-29 MED ORDER — ONDANSETRON HCL 4 MG/2ML IJ SOLN
4.0000 mg | Freq: Once | INTRAMUSCULAR | Status: AC
  Administered 2015-10-29: 4 mg via INTRAVENOUS
  Filled 2015-10-29: qty 2

## 2015-10-29 MED ORDER — CEPHALEXIN 500 MG PO CAPS: 500.0000 mg | ORAL_CAPSULE | Freq: Two times a day (BID) | ORAL | Status: DC

## 2015-10-29 NOTE — Discharge Instructions (Signed)
Read the information below.  Use the prescribed medication as directed.  Please discuss all new medications with your pharmacist.  You may return to the Emergency Department at any time for worsening condition or any new symptoms that concern you.    If you develop high fevers, abdominal pain, severe headache, worsening of your speech or gait, uncontrolled vomiting, or are unable to tolerate fluids by mouth, return to the ER for a recheck.     Nausea and Vomiting Nausea is a sick feeling that often comes before throwing up (vomiting). Vomiting is a reflex where stomach contents come out of your mouth. Vomiting can cause severe loss of body fluids (dehydration). Children and elderly adults can become dehydrated quickly, especially if they also have diarrhea. Nausea and vomiting are symptoms of a condition or disease. It is important to find the cause of your symptoms. CAUSES   Direct irritation of the stomach lining. This irritation can result from increased acid production (gastroesophageal reflux disease), infection, food poisoning, taking certain medicines (such as nonsteroidal anti-inflammatory drugs), alcohol use, or tobacco use.  Signals from the brain.These signals could be caused by a headache, heat exposure, an inner ear disturbance, increased pressure in the brain from injury, infection, a tumor, or a concussion, pain, emotional stimulus, or metabolic problems.  An obstruction in the gastrointestinal tract (bowel obstruction).  Illnesses such as diabetes, hepatitis, gallbladder problems, appendicitis, kidney problems, cancer, sepsis, atypical symptoms of a heart attack, or eating disorders.  Medical treatments such as chemotherapy and radiation.  Receiving medicine that makes you sleep (general anesthetic) during surgery. DIAGNOSIS Your caregiver may ask for tests to be done if the problems do not improve after a few days. Tests may also be done if symptoms are severe or if the reason  for the nausea and vomiting is not clear. Tests may include:  Urine tests.  Blood tests.  Stool tests.  Cultures (to look for evidence of infection).  X-rays or other imaging studies. Test results can help your caregiver make decisions about treatment or the need for additional tests. TREATMENT You need to stay well hydrated. Drink frequently but in small amounts.You may wish to drink water, sports drinks, clear broth, or eat frozen ice pops or gelatin dessert to help stay hydrated.When you eat, eating slowly may help prevent nausea.There are also some antinausea medicines that may help prevent nausea. HOME CARE INSTRUCTIONS   Take all medicine as directed by your caregiver.  If you do not have an appetite, do not force yourself to eat. However, you must continue to drink fluids.  If you have an appetite, eat a normal diet unless your caregiver tells you differently.  Eat a variety of complex carbohydrates (rice, wheat, potatoes, bread), lean meats, yogurt, fruits, and vegetables.  Avoid high-fat foods because they are more difficult to digest.  Drink enough water and fluids to keep your urine clear or pale yellow.  If you are dehydrated, ask your caregiver for specific rehydration instructions. Signs of dehydration may include:  Severe thirst.  Dry lips and mouth.  Dizziness.  Dark urine.  Decreasing urine frequency and amount.  Confusion.  Rapid breathing or pulse. SEEK IMMEDIATE MEDICAL CARE IF:   You have blood or brown flecks (like coffee grounds) in your vomit.  You have black or bloody stools.  You have a severe headache or stiff neck.  You are confused.  You have severe abdominal pain.  You have chest pain or trouble breathing.  You do  not urinate at least once every 8 hours.  You develop cold or clammy skin.  You continue to vomit for longer than 24 to 48 hours.  You have a fever. MAKE SURE YOU:   Understand these instructions.  Will  watch your condition.  Will get help right away if you are not doing well or get worse.   This information is not intended to replace advice given to you by your health care provider. Make sure you discuss any questions you have with your health care provider.   Document Released: 09/12/2005 Document Revised: 12/05/2011 Document Reviewed: 02/09/2011 Elsevier Interactive Patient Education 2016 [employer].  Fatigue Fatigue is feeling tired all of the time, a lack of energy, or a lack of motivation. Occasional or mild fatigue is often a normal response to activity or life in general. However, long-lasting (chronic) or extreme fatigue may indicate an underlying medical condition. HOME CARE INSTRUCTIONS  Watch your fatigue for any changes. The following actions may help to lessen any discomfort you are feeling:  Talk to your health care provider about how much sleep you need each night. Try to get the required amount every night.  Take medicines only as directed by your health care provider.  Eat a healthy and nutritious diet. Ask your health care provider if you need help changing your diet.  Drink enough fluid to keep your urine clear or pale yellow.  Practice ways of relaxing, such as yoga, meditation, massage therapy, or acupuncture.  Exercise regularly.   Change situations that cause you stress. Try to keep your work and personal routine reasonable.  Do not abuse illegal drugs.  Limit alcohol intake to no more than 1 drink per day for nonpregnant women and 2 drinks per day for men. One drink equals 12 ounces of beer, 5 ounces of wine, or 1 ounces of hard liquor.  Take a multivitamin, if directed by your health care provider. SEEK MEDICAL CARE IF:   Your fatigue does not get better.  You have a fever.   You have unintentional weight loss or gain.  You have headaches.   You have difficulty:   Falling asleep.  Sleeping throughout the night.  You feel angry,  guilty, anxious, or sad.   You are unable to have a bowel movement (constipation).   You skin is dry.   Your legs or another part of your body is swollen.  SEEK IMMEDIATE MEDICAL CARE IF:   You feel confused.   Your vision is blurry.  You feel faint or pass out.   You have a severe headache.   You have severe abdominal, pelvic, or back pain.   You have chest pain, shortness of breath, or an irregular or fast heartbeat.   You are unable to urinate or you urinate less than normal.   You develop abnormal bleeding, such as bleeding from the rectum, vagina, nose, lungs, or nipples.  You vomit blood.   You have thoughts about harming yourself or committing suicide.   You are worried that you might harm someone else.    This information is not intended to replace advice given to you by your health care provider. Make sure you discuss any questions you have with your health care provider.   Document Released: 07/10/2007 Document Revised: 10/03/2014 Document Reviewed: 01/14/2014 Elsevier Interactive Patient Education 2016 [employer]  Urinary Tract Infection Urinary tract infections (UTIs) can develop anywhere along your urinary tract. Your urinary tract is your body's drainage system for  removing wastes and extra water. Your urinary tract includes two kidneys, two ureters, a bladder, and a urethra. Your kidneys are a pair of bean-shaped organs. Each kidney is about the size of your fist. They are located below your ribs, one on each side of your spine. CAUSES Infections are caused by microbes, which are microscopic organisms, including fungi, viruses, and bacteria. These organisms are so small that they can only be seen through a microscope. Bacteria are the microbes that most commonly cause UTIs. SYMPTOMS  Symptoms of UTIs may vary by age and gender of the patient and by the location of the infection. Symptoms in young women typically include a frequent and  intense urge to urinate and a painful, burning feeling in the bladder or urethra during urination. Older women and men are more likely to be tired, shaky, and weak and have muscle aches and abdominal pain. A fever may mean the infection is in your kidneys. Other symptoms of a kidney infection include pain in your back or sides below the ribs, nausea, and vomiting. DIAGNOSIS To diagnose a UTI, your caregiver will ask you about your symptoms. Your caregiver will also ask you to provide a urine sample. The urine sample will be tested for bacteria and white blood cells. White blood cells are made by your body to help fight infection. TREATMENT  Typically, UTIs can be treated with medication. Because most UTIs are caused by a bacterial infection, they usually can be treated with the use of antibiotics. The choice of antibiotic and length of treatment depend on your symptoms and the type of bacteria causing your infection. HOME CARE INSTRUCTIONS  If you were prescribed antibiotics, take them exactly as your caregiver instructs you. Finish the medication even if you feel better after you have only taken some of the medication.  Drink enough water and fluids to keep your urine clear or pale yellow.  Avoid caffeine, tea, and carbonated beverages. They tend to irritate your bladder.  Empty your bladder often. Avoid holding urine for long periods of time.  Empty your bladder before and after sexual intercourse.  After a bowel movement, women should cleanse from front to back. Use each tissue only once. SEEK MEDICAL CARE IF:   You have back pain.  You develop a fever.  Your symptoms do not begin to resolve within 3 days. SEEK IMMEDIATE MEDICAL CARE IF:   You have severe back pain or lower abdominal pain.  You develop chills.  You have nausea or vomiting.  You have continued burning or discomfort with urination. MAKE SURE YOU:   Understand these instructions.  Will watch your  condition.  Will get help right away if you are not doing well or get worse.   This information is not intended to replace advice given to you by your health care provider. Make sure you discuss any questions you have with your health care provider.   Document Released: 06/22/2005 Document Revised: 06/03/2015 Document Reviewed: 10/21/2011 Elsevier Interactive Patient Education [employer]
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
## 2015-10-31 LAB — URINE CULTURE: Culture: >=100000

## 2015-11-01 ENCOUNTER — Telehealth (HOSPITAL_BASED_OUTPATIENT_CLINIC_OR_DEPARTMENT_OTHER): Payer: Self-pay | Admitting: Emergency Medicine

## 2015-11-01 NOTE — Telephone Encounter (Signed)
Post ED Visit - Positive Culture Follow-up  Culture report reviewed by antimicrobial stewardship pharmacist:   Enzo Bi, Pharm.D.  Celedonio Miyamoto, [address].D., BCPS  Garvin Fila, Pharm.D.  Georgina Pillion, Pharm.D., BCPS  [place], [address].D., BCPS, AAHIVP  Estella Husk, Pharm.D., BCPS, AAHIVP  Cassie Stewart, Pharm.D.  Rob Oswaldo Done, [address].D.  Positive urine culture E. coli Treated with cephalexin, organism sensitive to the same and no further patient follow-up is required at this time.  Berle Mull 11/01/2015, 1:12 PM

## 2015-11-13 ENCOUNTER — Ambulatory Visit: Payer: Medicaid Other | Attending: Psychology | Admitting: Psychology

## 2015-11-13 ENCOUNTER — Encounter: Payer: Self-pay | Admitting: Psychology

## 2015-11-13 DIAGNOSIS — F070 Personality change due to known physiological condition: Secondary | ICD-10-CM | POA: Diagnosis not present

## 2015-11-13 DIAGNOSIS — S0990XS Unspecified injury of head, sequela: Secondary | ICD-10-CM | POA: Diagnosis not present

## 2015-11-13 DIAGNOSIS — IMO0001 Reserved for inherently not codable concepts without codable children: Secondary | ICD-10-CM

## 2015-11-13 DIAGNOSIS — F4321 Adjustment disorder with depressed mood: Secondary | ICD-10-CM
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
## 2015-11-25 ENCOUNTER — Ambulatory Visit: Payer: Medicaid Other | Attending: Psychology | Admitting: Psychology

## 2015-11-25 ENCOUNTER — Encounter: Payer: Self-pay | Admitting: Psychology

## 2015-11-25 DIAGNOSIS — H8111 Benign paroxysmal vertigo, right ear: Secondary | ICD-10-CM | POA: Insufficient documentation

## 2015-11-25 DIAGNOSIS — IMO0001 Reserved for inherently not codable concepts without codable children: Secondary | ICD-10-CM

## 2015-11-25 DIAGNOSIS — F070 Personality change due to known physiological condition: Secondary | ICD-10-CM | POA: Insufficient documentation

## 2015-11-25 DIAGNOSIS — S0990XS Unspecified injury of head, sequela: Secondary | ICD-10-CM

## 2015-11-25 DIAGNOSIS — R42 Dizziness and giddiness: Secondary | ICD-10-CM | POA: Insufficient documentation

## 2015-11-25 DIAGNOSIS — R269 Unspecified abnormalities of gait and mobility: Secondary | ICD-10-CM | POA: Insufficient documentation

## 2015-11-25 DIAGNOSIS — F4321 Adjustment disorder with depressed mood: Secondary | ICD-10-CM | POA: Insufficient documentation

## 2015-11-25 DIAGNOSIS — X58XXXS Exposure to other specified factors, sequela: Secondary | ICD-10-CM | POA: Insufficient documentation

## 2015-11-25 NOTE — Progress Notes (Addendum)
[facility]  [address]   Telephone [phone] [address] Fax [phone] [place], [place] [address]   Psychology Progress Note   Name:  Leah Rojas Date of Birth:  [birth year] MRN:  [id number]  Date:11/25/2015 (64m) psychotherapy [name] was able to identify reduced control over anger as something she would like to work on. She reported that her anger control has been lower since her TBI. She reported primarily expressing anger verbally though has "shoved" some adult family members on occasion. She identified triggers for anger as when somebody disputes something she says or restricts her activity out of cautiousness.She stated that she often later regrets having become angry. She did not report any other signs of behavioral or mood dyscontrol or disinhibition. She reported some fleeting dysphoria over her loss of independence in life but denied   symptoms of anxiety or depression. She reports fatigue to physical activity as a major limiting factor. She remains hopeful of someday resuming driving and employment but has heard her physician tell her that she is "pushing too fast". Family continues to be involved and supportive per [name].  She presented as an alert, friendly woman with slighlty pressured dysarthric speech and mostly positive affect that was at times incongruent.   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 Discussed the benefits of regular exercise in improving physical stamina. She is considering doing water aerobics, which she reported was recommended to her by Dr. Riley Kill. She might benefit from wearing an activity tracker as a way to motivate her to walk more. She agreed to invite her mother and sister to our next session at my suggestion.   Return in two  weeks.   Gladstone Pih, Ph.D Licensed Psychologist

## 2015-12-02 ENCOUNTER — Encounter: Payer: Medicaid Other | Attending: Physical Medicine & Rehabilitation | Admitting: Physical Medicine & Rehabilitation

## 2015-12-02 ENCOUNTER — Encounter: Payer: Self-pay | Admitting: Physical Medicine & Rehabilitation

## 2015-12-02 VITALS — BP 120/63 | HR 72 | Resp 14

## 2015-12-02 DIAGNOSIS — S069X5S Unspecified intracranial injury with loss of consciousness greater than 24 hours with return to pre-existing conscious level, sequela: Secondary | ICD-10-CM | POA: Diagnosis present

## 2015-12-02 DIAGNOSIS — H8111 Benign paroxysmal vertigo, right ear: Secondary | ICD-10-CM | POA: Diagnosis not present

## 2015-12-02 DIAGNOSIS — S06306S Unspecified focal traumatic brain injury with loss of consciousness greater than 24 hours without return to pre-existing conscious level with patient surviving, sequela: Secondary | ICD-10-CM | POA: Diagnosis not present

## 2015-12-02 DIAGNOSIS — F09 Unspecified mental disorder due to known physiological condition: Secondary | ICD-10-CM | POA: Insufficient documentation

## 2015-12-02 DIAGNOSIS — F918 Other conduct disorders: Secondary | ICD-10-CM | POA: Diagnosis not present

## 2015-12-02 DIAGNOSIS — T148 Other injury of unspecified body region: Secondary | ICD-10-CM | POA: Insufficient documentation

## 2015-12-02 DIAGNOSIS — S069X0S Unspecified intracranial injury without loss of consciousness, sequela: Secondary | ICD-10-CM

## 2015-12-02 DIAGNOSIS — R4689 Other symptoms and signs involving appearance and behavior: Secondary | ICD-10-CM

## 2015-12-02 DIAGNOSIS — R21 Rash and other nonspecific skin eruption: Secondary | ICD-10-CM | POA: Diagnosis not present

## 2015-12-02 DIAGNOSIS — S069XAS Other symptoms and signs involving appearance and behavior: Secondary | ICD-10-CM

## 2015-12-02 DIAGNOSIS — H8110 Benign paroxysmal vertigo, unspecified ear: Secondary | ICD-10-CM | POA: Insufficient documentation
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
## 2015-12-02 NOTE — Patient Instructions (Signed)
  PLEASE CALL ME WITH ANY PROBLEMS OR QUESTIONS (#[phone]).      

## 2015-12-02 NOTE — Progress Notes (Deleted)
   Subjective:    Patient ID: Leah Rojas, female    DOB: 01/20/1991, 25 y.o.   MRN: [id number]  HPI    Review of Systems     Objective:   Physical Exam        Assessment & Plan:

## 2015-12-10 ENCOUNTER — Ambulatory Visit (INDEPENDENT_AMBULATORY_CARE_PROVIDER_SITE_OTHER): Payer: Medicaid Other | Admitting: Gastroenterology

## 2015-12-10 ENCOUNTER — Ambulatory Visit (INDEPENDENT_AMBULATORY_CARE_PROVIDER_SITE_OTHER): Payer: Medicaid Other | Admitting: Psychology

## 2015-12-10 ENCOUNTER — Encounter: Payer: Self-pay | Admitting: Gastroenterology

## 2015-12-10 VITALS — BP 112/82 | HR 84 | Ht 64.0 in | Wt 155.6 lb

## 2015-12-10 DIAGNOSIS — IMO0001 Reserved for inherently not codable concepts without codable children: Secondary | ICD-10-CM

## 2015-12-10 DIAGNOSIS — G43A0 Cyclical vomiting, not intractable: Secondary | ICD-10-CM | POA: Diagnosis not present

## 2015-12-10 DIAGNOSIS — F4321 Adjustment disorder with depressed mood: Secondary | ICD-10-CM

## 2015-12-10 DIAGNOSIS — R1115 Cyclical vomiting syndrome unrelated to migraine: Secondary | ICD-10-CM
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
## 2015-12-10 NOTE — Patient Instructions (Signed)
Please call us if you have further gastrointestinal problems.

## 2015-12-11 NOTE — Progress Notes (Addendum)
[facility]  [address]   Telephone [phone] [address] Fax [phone] [place], [place] [address]   Psychology Progress Note   Name:  Ocie Stanzione Date of Birth:  1990/12/18 MRN:  [id number]  12/10/15 (48m psychotherapy Met with SRocco Sereneand her mother, sister and godmother as planned.   All family members agreed that [facility] control has steadily improved over the past three months. According to family, she has tended to react with irritability ("she gets mean") lasting up to thirty minutes when someone tries to step in and help her with a physical task, when her belief about something is disputed, when she has to wait longer than planned or in reaction to becoming fatigued about two to three hours into an activity or outing. There was no report of her displaying aggressive, socially inappropriate or unsafe behavior. She has seemed sad at times though not persistently so. They agreed that she has shown good parenting skills with her daughter. They have not observed her to have displayed any cognitive difficulties.  SJaiyaacknowledged irritable reactions to family, which she attributed to feeling discouraged about her loss of independence in life. She expressed a desire to someday resume employment, driving and possibly go back to school but stated "I know I have to take it slow".   She presented as typical with mildly unsteady gait, slighlty pressured dysarthric speech and mostly positive affect that was at times incongruent. She expressed appreciation to her famly for their help and support since her TBI, which she had not directly expressed to them before.  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 I would like her to undergo neuropsychological testing to get  a better handle on her cognitive functioning almost one year post-injury. I think she has vocational potential. I wonder when she will be ready to be assessed for driving. I already discussed with SShirleymaeincreasing her physical activity towards developing improved physical stamina (asked her to discuss this with the physical therapist he will be seeing shortly). Will discuss her case with Dr. SNaaman Plummerin the near future.  Return in two weeks.   MJamey Ripa Ph.D Licensed Psychologist

## 2015-12-14 ENCOUNTER — Ambulatory Visit: Payer: Medicaid Other | Admitting: Rehabilitative and Restorative Service Providers"

## 2015-12-14 ENCOUNTER — Ambulatory Visit: Payer: Medicaid Other | Admitting: Physical Therapy

## 2015-12-14 DIAGNOSIS — R42 Dizziness and giddiness: Secondary | ICD-10-CM

## 2015-12-14 DIAGNOSIS — H8111 Benign paroxysmal vertigo, right ear: Secondary | ICD-10-CM

## 2015-12-14 DIAGNOSIS — F4321 Adjustment disorder with depressed mood: Secondary | ICD-10-CM | POA: Diagnosis present

## 2015-12-14 DIAGNOSIS — S0990XS Unspecified injury of head, sequela: Secondary | ICD-10-CM | POA: Diagnosis not present

## 2015-12-14 DIAGNOSIS — F070 Personality change due to known physiological condition: Secondary | ICD-10-CM | POA: Diagnosis present

## 2015-12-14 DIAGNOSIS — X58XXXS Exposure to other specified factors, sequela: Secondary | ICD-10-CM | POA: Diagnosis not present

## 2015-12-14 DIAGNOSIS — R269 Unspecified abnormalities of gait and mobility: Secondary | ICD-10-CM

## 2015-12-14 NOTE — Patient Instructions (Signed)
Tip Card 1.The goal of habituation training is to assist in decreasing symptoms of vertigo, dizziness, or nausea provoked by specific head and body motions. 2.These exercises may initially increase symptoms; however, be persistent and work through symptoms. With repetition and time, the exercises will assist in reducing or eliminating symptoms. 3.Exercises should be stopped and discussed with the therapist if you experience any of the following: - Sudden change or fluctuation in hearing - New onset of ringing in the ears, or increase in current intensity - Any fluid discharge from the ear - Severe pain in neck or back - Extreme nausea  Copyright  VHI. All rights reserved.  Sit to Side-Lying   Sit on edge of bed. Lie down onto the right side *while looking up towards the ceiling* and hold until dizziness stops, plus 20 seconds.  Return to sitting and wait until dizziness stops, plus 20 seconds.   Repeat sequence 5 times per session. Do 2 sessions per day.  Copyright  VHI. All rights reserved.  Gaze Stabilization: Tip Card 1.Target must remain in focus, not blurry, and appear stationary while head is in motion. 2.Perform exercises with small head movements (45 to either side of midline). 3.Increase speed of head motion so long as target is in focus. 4.If you wear eyeglasses, be sure you can see target through lens (therapist will give specific instructions for bifocal / progressive lenses). 5.These exercises may provoke dizziness or nausea. Work through these symptoms. If too dizzy, slow head movement slightly. Rest between each exercise. 6.Exercises demand concentration; avoid distractions. 7.For safety, perform standing exercises close to a counter, wall, corner, or next to someone.  Copyright  VHI. All rights reserved.  Gaze Stabilization: Standing Feet Apart   *BEGIN IN A SITTING POSITION. Keep eyes on target on wall 3 feet away, tilt head down slightly and move head side to side for  30 seconds with glasses on.  Remove glasses and repeat x 30 seconds without glasses.  Do 2 sessions per day.   Copyright  VHI. All rights reserved.   Feet Together, Head Motion - Eyes Open    With eyes open, feet together, move head slowly: up and down x 5 reps.  Let it settle.  Then move head side to side x 5 reps. Repeat __5__ times per session. Do __2__ sessions per day.  Copyright  VHI. All rights reserved.   Feet Together, Varied Arm Positions - Eyes Closed    Stand with feet together and arms at your side. Close eyes and visualize upright position. Hold _30___ seconds. Repeat _3___ times per session. Do __2__ sessions per day.  Copyright  VHI. All rights reserved.
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
## 2015-12-18 ENCOUNTER — Encounter (HOSPITAL_COMMUNITY): Payer: Self-pay | Admitting: *Deleted

## 2015-12-18 ENCOUNTER — Emergency Department (HOSPITAL_COMMUNITY)
Admission: EM | Admit: 2015-12-18 | Discharge: 2015-12-18 | Disposition: A | Discharge status: Home / Self Care | Payer: Medicaid Other | Attending: Emergency Medicine | Admitting: Emergency Medicine

## 2015-12-18 DIAGNOSIS — R300 Dysuria: Secondary | ICD-10-CM | POA: Diagnosis present

## 2015-12-18 DIAGNOSIS — Z87820 Personal history of traumatic brain injury: Secondary | ICD-10-CM | POA: Insufficient documentation

## 2015-12-18 DIAGNOSIS — Z8709 Personal history of other diseases of the respiratory system: Secondary | ICD-10-CM | POA: Insufficient documentation

## 2015-12-18 DIAGNOSIS — N390 Urinary tract infection, site not specified: Secondary | ICD-10-CM | POA: Insufficient documentation

## 2015-12-18 DIAGNOSIS — Z3202 Encounter for pregnancy test, result negative: Secondary | ICD-10-CM | POA: Insufficient documentation

## 2015-12-18 DIAGNOSIS — Z87891 Personal history of nicotine dependence: Secondary | ICD-10-CM | POA: Diagnosis not present

## 2015-12-18 DIAGNOSIS — Z79899 Other long term (current) drug therapy: Secondary | ICD-10-CM | POA: Insufficient documentation

## 2015-12-18 LAB — POC URINE PREG, ED: PREG TEST UR: NEGATIVE

## 2015-12-18 LAB — URINE MICROSCOPIC-ADD ON

## 2015-12-18 LAB — URINALYSIS, ROUTINE W REFLEX MICROSCOPIC
BILIRUBIN URINE: NEGATIVE
Glucose, UA: NEGATIVE mg/dL
Ketones, ur: NEGATIVE mg/dL
Nitrite: NEGATIVE
PH: 6 (ref 5.0–8.0)
Protein, ur: 100 mg/dL — AB
SPECIFIC GRAVITY, URINE: 1.024 (ref 1.005–1.030)

## 2015-12-18 MED ORDER — PHENAZOPYRIDINE HCL 200 MG PO TABS: 200.0000 mg | ORAL_TABLET | Freq: Three times a day (TID) | ORAL | Status: DC

## 2015-12-18 MED ORDER — CEPHALEXIN 500 MG PO CAPS: 500.0000 mg | ORAL_CAPSULE | Freq: Two times a day (BID) | ORAL | Status: DC

## 2015-12-18 NOTE — Discharge Instructions (Signed)
You were diagnosed with a UTI or urinary tract infection. You will be treated for this with antibiotics. Take all of your antibiotics as prescribed. Do not save or share them. You may also take your Azo to help with the burning sensation. Follow-up with your doctor next week. Return to ED for new or worsening symptoms as we discussed.  Urinary Tract Infection Urinary tract infections (UTIs) can develop anywhere along your urinary tract. Your urinary tract is your body's drainage system for removing wastes and extra water. Your urinary tract includes two kidneys, two ureters, a bladder, and a urethra. Your kidneys are a pair of bean-shaped organs. Each kidney is about the size of your fist. They are located below your ribs, one on each side of your spine. CAUSES Infections are caused by microbes, which are microscopic organisms, including fungi, viruses, and bacteria. These organisms are so small that they can only be seen through a microscope. Bacteria are the microbes that most commonly cause UTIs. SYMPTOMS  Symptoms of UTIs may vary by age and gender of the patient and by the location of the infection. Symptoms in young women typically include a frequent and intense urge to urinate and a painful, burning feeling in the bladder or urethra during urination. Older women and men are more likely to be tired, shaky, and weak and have muscle aches and abdominal pain. A fever may mean the infection is in your kidneys. Other symptoms of a kidney infection include pain in your back or sides below the ribs, nausea, and vomiting. DIAGNOSIS To diagnose a UTI, your caregiver will ask you about your symptoms. Your caregiver will also ask you to provide a urine sample. The urine sample will be tested for bacteria and white blood cells. White blood cells are made by your body to help fight infection. TREATMENT  Typically, UTIs can be treated with medication. Because most UTIs are caused by a bacterial infection, they  usually can be treated with the use of antibiotics. The choice of antibiotic and length of treatment depend on your symptoms and the type of bacteria causing your infection. HOME CARE INSTRUCTIONS  If you were prescribed antibiotics, take them exactly as your caregiver instructs you. Finish the medication even if you feel better after you have only taken some of the medication.  Drink enough water and fluids to keep your urine clear or pale yellow.  Avoid caffeine, tea, and carbonated beverages. They tend to irritate your bladder.  Empty your bladder often. Avoid holding urine for long periods of time.  Empty your bladder before and after sexual intercourse.  After a bowel movement, women should cleanse from front to back. Use each tissue only once. SEEK MEDICAL CARE IF:   You have back pain.  You develop a fever.  Your symptoms do not begin to resolve within 3 days. SEEK IMMEDIATE MEDICAL CARE IF:   You have severe back pain or lower abdominal pain.  You develop chills.  You have nausea or vomiting.  You have continued burning or discomfort with urination. MAKE SURE YOU:   Understand these instructions.  Will watch your condition.  Will get help right away if you are not doing well or get worse.   This information is not intended to replace advice given to you by your health care provider. Make sure you discuss any questions you have with your health care provider.   Document Released: 06/22/2005 Document Revised: 06/03/2015 Document Reviewed: 10/21/2011 Elsevier Interactive Patient Education [employer]2016 [employer] Inc.

## 2015-12-18 NOTE — ED Notes (Signed)
Pt reports urinary frequency and pain with urination x 2 days. Denies vaginal symptoms.

## 2015-12-18 NOTE — ED Provider Notes (Signed)
CSN: [id number]     Arrival date & time 12/18/15  1347 History  By signing my name below, I, Freida Busmaniana Omoyeni, attest that this documentation has been prepared under the direction and in the presence of non-physician practitioner, Joycie PeekBenjamin Romanita Fager, PA-C. Electronically Signed: Freida Busmaniana Omoyeni, Scribe. 12/18/2015. 2:36 PM.  Chief Complaint  Patient presents with  . Urinary Tract Infection    The history is provided by the patient. No language interpreter was used.   HPI Comments:  [name] is a 25 y.o. female who presents to the Emergency Department complaining of urinary frequency with associated dysuria x 2 days. Pt reports h/o UTI with similar symptoms.  She denies fever, chills, vaginal discharge/bleeding, nausea, vomiting, flank pain and abdominal pain. Tried drinking water and cranberry juice without relief. No other worsening factors.   Past Medical History  Diagnosis Date  . Bronchitis     rescue inhaler prn  . Traumatic brain injury [facility](HCC)    Past Surgical History  Procedure Laterality Date  . No past surgeries     Family History  Problem Relation Age of Onset  . Other Neg Hx   . Mental retardation Cousin   . Heart Problems Mother   . Colon cancer Neg Hx   . Esophageal cancer Neg Hx   . Stomach cancer Neg Hx   . Pancreatic cancer Neg Hx   . Colon polyps Neg Hx   . Diabetes Neg Hx   . Kidney disease Neg Hx   . Liver disease Neg Hx    Social History  Substance Use Topics  . Smoking status: Former Smoker -- 0.25 packs/day    Types: Cigarettes    Quit date: 12/18/2014  . Smokeless tobacco: Never Used  . Alcohol Use: No   OB History    Gravida Para Term Preterm AB TAB SAB Ectopic Multiple Living   2 1 1  0 1 0 1 0 0 1     Review of Systems  10 systems reviewed and all are negative for acute change except as noted in the HPI.  Allergies  Review of patient's allergies indicates no known allergies.  Home Medications   Prior to Admission medications    Medication Sig Start Date End Date Taking? Authorizing Provider  acetaminophen-codeine (TYLENOL #3) 300-30 MG per tablet Take 1 tablet by mouth daily as needed for moderate pain. 05/27/15   Ranelle OysterZachary T Swartz, MD  cephALEXin (KEFLEX) 500 MG capsule Take 1 capsule (500 mg total) by mouth 2 (two) times daily. 12/18/15   Joycie PeekBenjamin Daniell Paradise, PA-C  citalopram (CELEXA) 10 MG tablet Take 1 tablet (10 mg total) by mouth at bedtime. 08/10/15   Ranelle OysterZachary T Swartz, MD  divalproex (DEPAKOTE) 500 MG DR tablet Take 1 tablet (500 mg total) by mouth 2 (two) times daily. Patient taking differently: Take 500 mg by mouth at bedtime.  03/25/15   Ranelle OysterZachary T Swartz, MD  Multiple Vitamin (MULTIVITAMIN WITH MINERALS) TABS tablet Place 1 tablet into feeding tube daily. 01/14/15   Almond LintFaera Byerly, MD  ondansetron (ZOFRAN) 4 MG tablet Take 1 tablet (4 mg total) by mouth every 8 (eight) hours as needed for nausea or vomiting. 10/29/15   Trixie DredgeEmily West, PA-C  phenazopyridine (PYRIDIUM) 200 MG tablet Take 1 tablet (200 mg total) by mouth 3 (three) times daily. 12/18/15   Joycie PeekBenjamin Jhonny Calixto, PA-C   BP 114/80 mmHg  Pulse 80  Temp(Src) 99 F (37.2 C) (Oral)  Resp 18  SpO2 100%  LMP 11/29/2015 Physical Exam  Constitutional:  She is oriented to person, place, and time. She appears well-developed and well-nourished. No distress.  HENT:  Head: Normocephalic and atraumatic.  Mouth/Throat: Oropharynx is clear and moist.  Eyes: Conjunctivae are normal. Pupils are equal, round, and reactive to light. Right eye exhibits no discharge. Left eye exhibits no discharge. No scleral icterus.  Neck: Neck supple.  Cardiovascular: Normal rate, regular rhythm and normal heart sounds.   Pulmonary/Chest: Effort normal and breath sounds normal. No respiratory distress. She has no wheezes. She has no rales.  Abdominal: Soft. She exhibits no distension. There is no tenderness.  Musculoskeletal: She exhibits no tenderness.  Neurological: She is alert and oriented to  person, place, and time.  Cranial Nerves II-XII grossly intact  Skin: Skin is warm and dry. No rash noted.  Psychiatric: She has a normal mood and affect.  Nursing note and vitals reviewed.   ED Course  Procedures   DIAGNOSTIC STUDIES:  Oxygen Saturation is 100% on RA, normal by my interpretation.    COORDINATION OF CARE:  2:30 PM Discussed treatment plan with pt at bedside and pt agreed to plan.  Labs Review Labs Reviewed  URINALYSIS, ROUTINE W REFLEX MICROSCOPIC (NOT AT [facility]) - Abnormal; Notable for the following:    APPearance CLOUDY (*)    Hgb urine dipstick LARGE (*)    Protein, ur 100 (*)    Leukocytes, UA LARGE (*)    All other components within normal limits  URINE MICROSCOPIC-ADD ON - Abnormal; Notable for the following:    Squamous Epithelial / LPF 6-30 (*)    Bacteria, UA FEW (*)    All other components within normal limits  POC URINE PREG, ED   I have personally reviewed and evaluated these images and lab results as part of my medical decision-making.  Meds given in ED:  Medications - No data to display  New Prescriptions   CEPHALEXIN (KEFLEX) 500 MG CAPSULE    Take 1 capsule (500 mg total) by mouth 2 (two) times daily.   PHENAZOPYRIDINE (PYRIDIUM) 200 MG TABLET    Take 1 tablet (200 mg total) by mouth 3 (three) times daily.    Filed Vitals:   12/18/15 1355  BP: 114/80  Pulse: 80  Temp: 99 F (37.2 C)  TempSrc: Oral  Resp: 18  SpO2: 100%     MDM  Pt diagnosed with a UTI. Pt is afebrile, without tachycardia, hypotension, or other signs of serious infection. No nausea, vomiting, back pain or other signs/symptoms concerning for pyelonephritis. Pregnancy negative. Pt to be discharged home with antibiotics and instructions to follow up with PCP if symptoms persist. Discussed return precautions. Pt appears safe for discharge.  Final diagnoses:  UTI (lower urinary tract infection)   I personally performed the services described in this documentation,  which was scribed in my presence. The recorded information has been reviewed and is accurate.   Joycie Peek, PA-C 12/18/15 1449  Eber Hong, MD 12/19/15 [phone]

## 2015-12-24 ENCOUNTER — Ambulatory Visit: Payer: Medicaid Other | Admitting: Psychology

## 2015-12-28 ENCOUNTER — Ambulatory Visit: Payer: Medicaid Other | Attending: Psychology | Admitting: Psychology

## 2015-12-28 ENCOUNTER — Encounter: Payer: Self-pay | Admitting: Psychology

## 2015-12-28 DIAGNOSIS — IMO0001 Reserved for inherently not codable concepts without codable children: Secondary | ICD-10-CM

## 2015-12-28 DIAGNOSIS — F4321 Adjustment disorder with depressed mood: Secondary | ICD-10-CM | POA: Insufficient documentation

## 2015-12-28 DIAGNOSIS — S0990XS Unspecified injury of head, sequela: Secondary | ICD-10-CM | POA: Insufficient documentation

## 2015-12-28 DIAGNOSIS — F070 Personality change due to known physiological condition: Secondary | ICD-10-CM | POA: Insufficient documentation
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
## 2016-01-13 ENCOUNTER — Telehealth: Payer: Self-pay | Admitting: Physical Medicine & Rehabilitation

## 2016-01-13 NOTE — Telephone Encounter (Signed)
Please call and advise on how to take meds or what she should do for nausea

## 2016-01-14 NOTE — Telephone Encounter (Signed)
Leah Rojas' mother called and said that Leah Rojas reported every time she took her seizure medicine and mood medicine, she would throw up.  Rose tells me that Leah Rojas stopped the medication on her own for a week and was ok. She told her yesterday to try to take the seizure medication and Leah Rojas took it and did ok.  They wonder if it was the Celexa. Rose says that she will definitely need something for mood it the Celexa remains stopped. Please advise.

## 2016-01-15 MED ORDER — SERTRALINE HCL 25 MG PO TABS: 25.0000 mg | ORAL_TABLET | Freq: Every day | ORAL | Status: DC

## 2016-01-15 NOTE — Addendum Note (Signed)
Addended by: Doreene ElandSHUMAKER, Kember Boch W on: 01/15/2016 03:08 PM   Modules accepted: Orders, Medications

## 2016-01-15 NOTE — Telephone Encounter (Signed)
Can dc celexa and begin zoloft 25mg  qd #30, 1RF

## 2016-01-15 NOTE — Telephone Encounter (Signed)
Order placed and her mother notified

## 2016-01-18 ENCOUNTER — Ambulatory Visit (INDEPENDENT_AMBULATORY_CARE_PROVIDER_SITE_OTHER): Payer: Medicaid Other | Admitting: Psychology

## 2016-01-18 DIAGNOSIS — F070 Personality change due to known physiological condition: Secondary | ICD-10-CM

## 2016-01-18 DIAGNOSIS — IMO0001 Reserved for inherently not codable concepts without codable children: Secondary | ICD-10-CM

## 2016-01-18 DIAGNOSIS — F4321 Adjustment disorder with depressed mood: Secondary | ICD-10-CM | POA: Diagnosis not present

## 2016-01-18 DIAGNOSIS — S0990XS Unspecified injury of head, sequela: Secondary | ICD-10-CM
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
## 2016-01-20 ENCOUNTER — Telehealth: Payer: Self-pay | Admitting: Physical Medicine & Rehabilitation

## 2016-01-20 NOTE — Telephone Encounter (Signed)
Left message for Mrs Leah Rojas to call back to discuss.

## 2016-01-20 NOTE — Telephone Encounter (Signed)
Mother states that patient needs the medication and can tell when patient is taking it because she get sick - but is unsure which med is causeing the issue The only medication she is taking currently in the seizure medication Has not taken the new "mood" medication that was perscribed

## 2016-02-02 ENCOUNTER — Telehealth: Payer: Self-pay | Admitting: Physical Medicine & Rehabilitation

## 2016-02-02 NOTE — Telephone Encounter (Signed)
Needed clarification on a letter stated that was written for pt. [facility] that she will be bringing paperwork by for [facility] to fill out.

## 2016-02-05 ENCOUNTER — Ambulatory Visit: Payer: Medicaid Other | Admitting: Psychology

## 2016-02-06 ENCOUNTER — Other Ambulatory Visit: Payer: Self-pay | Admitting: Physical Medicine & Rehabilitation

## 2016-02-08 NOTE — Telephone Encounter (Signed)
rec'd electronic refill request for depakote and seroquel.....given the communication chain of telephone calls that state patient becoming ill after taking medications, I am deferring medication request to Dr. Riley KillSwartz for approval

## 2016-02-10 ENCOUNTER — Other Ambulatory Visit: Payer: Self-pay

## 2016-02-10 DIAGNOSIS — S069X5S Unspecified intracranial injury with loss of consciousness greater than 24 hours with return to pre-existing conscious level, sequela: Secondary | ICD-10-CM

## 2016-02-10 DIAGNOSIS — F09 Unspecified mental disorder due to known physiological condition: Secondary | ICD-10-CM

## 2016-02-10 DIAGNOSIS — W3400XA Accidental discharge from unspecified firearms or gun, initial encounter: Secondary | ICD-10-CM

## 2016-02-10 DIAGNOSIS — S069X0S Unspecified intracranial injury without loss of consciousness, sequela: Secondary | ICD-10-CM

## 2016-02-10 DIAGNOSIS — S0990XS Unspecified injury of head, sequela: Secondary | ICD-10-CM

## 2016-02-10 MED ORDER — DIVALPROEX SODIUM 500 MG PO DR TAB: 500.0000 mg | DELAYED_RELEASE_TABLET | Freq: Two times a day (BID) | ORAL | Status: DC

## 2016-02-10 NOTE — Telephone Encounter (Signed)
Pharmacy Advised. Patient has appointment on 03/01/2016.

## 2016-02-10 NOTE — Telephone Encounter (Signed)
Received a call from [facility] on [address]. Pharmacy states that they are requesting Seroquel but, this looks like it was discontinued on 10/07/2015. Please Advise.

## 2016-02-10 NOTE — Telephone Encounter (Signed)
No refills without f/u OV. we had stopped this medication in January

## 2016-02-12 ENCOUNTER — Encounter: Payer: Self-pay | Admitting: Psychology

## 2016-02-12 ENCOUNTER — Ambulatory Visit: Payer: Medicaid Other | Attending: Psychology | Admitting: Psychology

## 2016-02-12 DIAGNOSIS — F4321 Adjustment disorder with depressed mood: Secondary | ICD-10-CM | POA: Insufficient documentation

## 2016-02-12 DIAGNOSIS — Z87820 Personal history of traumatic brain injury: Secondary | ICD-10-CM | POA: Insufficient documentation
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
## 2016-02-24 ENCOUNTER — Encounter: Payer: Self-pay | Admitting: Physical Medicine & Rehabilitation

## 2016-03-02 ENCOUNTER — Encounter: Payer: Medicaid Other | Attending: Physical Medicine & Rehabilitation | Admitting: Physical Medicine & Rehabilitation

## 2016-03-02 ENCOUNTER — Encounter: Payer: Self-pay | Admitting: Physical Medicine & Rehabilitation

## 2016-03-02 VITALS — BP 120/62 | HR 81

## 2016-03-02 DIAGNOSIS — S069X5S Unspecified intracranial injury with loss of consciousness greater than 24 hours with return to pre-existing conscious level, sequela: Secondary | ICD-10-CM | POA: Diagnosis present

## 2016-03-02 DIAGNOSIS — S062X9D Diffuse traumatic brain injury with loss of consciousness of unspecified duration, subsequent encounter: Secondary | ICD-10-CM | POA: Diagnosis not present

## 2016-03-02 DIAGNOSIS — H8113 Benign paroxysmal vertigo, bilateral: Secondary | ICD-10-CM | POA: Diagnosis not present

## 2016-03-02 DIAGNOSIS — S06309D Unspecified focal traumatic brain injury with loss of consciousness of unspecified duration, subsequent encounter: Secondary | ICD-10-CM

## 2016-03-02 DIAGNOSIS — R21 Rash and other nonspecific skin eruption: Secondary | ICD-10-CM | POA: Diagnosis not present

## 2016-03-02 DIAGNOSIS — F09 Unspecified mental disorder due to known physiological condition: Secondary | ICD-10-CM | POA: Diagnosis not present

## 2016-03-02 DIAGNOSIS — T148 Other injury of unspecified body region: Secondary | ICD-10-CM | POA: Insufficient documentation

## 2016-03-02 DIAGNOSIS — S0291XK Unspecified fracture of skull, subsequent encounter for fracture with nonunion: Secondary | ICD-10-CM | POA: Diagnosis not present

## 2016-03-02 DIAGNOSIS — S0990XS Unspecified injury of head, sequela: Secondary | ICD-10-CM

## 2016-03-02 MED ORDER — TRAZODONE HCL 50 MG PO TABS: 25.0000 mg | ORAL_TABLET | Freq: Every day | ORAL | Status: DC

## 2016-03-02 MED ORDER — PROPRANOLOL HCL 20 MG PO TABS: 20.0000 mg | ORAL_TABLET | Freq: Three times a day (TID) | ORAL | Status: DC

## 2016-03-02 NOTE — Progress Notes (Deleted)
   Subjective:    Patient ID: Leah Rojas, female    DOB: 01/15/1991, 25 y.o.   MRN: [id number]  HPI    Review of Systems     Objective:   Physical Exam        Assessment & Plan:

## 2016-03-02 NOTE — Patient Instructions (Signed)
YOU NEED TO GET TO BED AT A REGULAR TIME EACH NIGHT---AROUND 9-11PM EACH NIGHT  START PROPRANOLOL 20MG  TWICE DAILY FOR ONE WEEK THEN INCREASE TO THREE X DAILY.    PLEASE CALL ME WITH ANY PROBLEMS OR QUESTIONS [phone]).
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
## 2016-03-03 ENCOUNTER — Ambulatory Visit: Payer: Medicaid Other | Admitting: Psychology

## 2016-03-04 ENCOUNTER — Ambulatory Visit: Payer: Medicaid Other

## 2016-03-04 ENCOUNTER — Ambulatory Visit: Payer: Medicaid Other | Attending: Psychology | Admitting: Psychology

## 2016-03-04 DIAGNOSIS — F4321 Adjustment disorder with depressed mood: Secondary | ICD-10-CM

## 2016-03-04 NOTE — Progress Notes (Signed)
[facility]  [address]   Telephone [phone] [address] Fax [phone] [place], [place] [address]   Psychology Progress Note   Name:  Leah BlitzShanese Mccarrick Date of Birth:  10/24/1990 MRN:  [id number]  Date:03/04/2016 (33108m) psychotherapy She states she is doing well. Mostly in good spirits with occasional short (1 - 2 hour) periods of low mood. Aware of some decrease in emotional control when tired but reported no significant problems with anger or impulse control. She stated her belief that sometimes she is misperceived as being angry due to the way her speech sounds. She reported that she underwent neurocognitive testing a week or so ago, which gave her a headache.  She has been busy with moving from her  apartment.   Diagnostic Impression Adjustment disorder with depressed mood [F43.21]  Encouraged her that when she believes others perceive her as angry to purposely speak more slowly and with less volume.   Return in 4 weeks (or sooner if necesssary).   Gladstone PihMichael F. Mayu Ronk, Ph.D Licensed Psychologist

## 2016-03-30 ENCOUNTER — Ambulatory Visit: Payer: Medicaid Other | Admitting: Psychology

## 2016-04-05 ENCOUNTER — Ambulatory Visit: Payer: Medicaid Other | Admitting: Psychology

## 2016-04-07 ENCOUNTER — Ambulatory Visit: Payer: Medicaid Other | Attending: Psychology | Admitting: Psychology

## 2016-04-07 DIAGNOSIS — H8111 Benign paroxysmal vertigo, right ear: Secondary | ICD-10-CM | POA: Insufficient documentation

## 2016-04-07 DIAGNOSIS — R42 Dizziness and giddiness: Secondary | ICD-10-CM | POA: Insufficient documentation

## 2016-04-07 DIAGNOSIS — S069X9S Unspecified intracranial injury with loss of consciousness of unspecified duration, sequela: Secondary | ICD-10-CM | POA: Insufficient documentation

## 2016-04-07 DIAGNOSIS — F4321 Adjustment disorder with depressed mood: Secondary | ICD-10-CM | POA: Insufficient documentation

## 2016-04-07 DIAGNOSIS — F0280 Dementia in other diseases classified elsewhere without behavioral disturbance: Secondary | ICD-10-CM | POA: Insufficient documentation

## 2016-04-07 DIAGNOSIS — R2689 Other abnormalities of gait and mobility: Secondary | ICD-10-CM | POA: Insufficient documentation

## 2016-04-15 ENCOUNTER — Ambulatory Visit: Payer: Medicaid Other | Admitting: Rehabilitative and Restorative Service Providers"

## 2016-04-21 ENCOUNTER — Ambulatory Visit (INDEPENDENT_AMBULATORY_CARE_PROVIDER_SITE_OTHER): Payer: Medicaid Other | Admitting: Psychology

## 2016-04-21 DIAGNOSIS — S069X0S Unspecified intracranial injury without loss of consciousness, sequela: Secondary | ICD-10-CM

## 2016-04-21 DIAGNOSIS — F09 Unspecified mental disorder due to known physiological condition: Secondary | ICD-10-CM

## 2016-04-21 DIAGNOSIS — F4321 Adjustment disorder with depressed mood: Secondary | ICD-10-CM

## 2016-04-21 DIAGNOSIS — F0280 Dementia in other diseases classified elsewhere without behavioral disturbance: Secondary | ICD-10-CM

## 2016-04-21 DIAGNOSIS — S069XAS Unspecified intracranial injury with loss of consciousness status unknown, sequela: Secondary | ICD-10-CM

## 2016-04-21 DIAGNOSIS — S069X9S Unspecified intracranial injury with loss of consciousness of unspecified duration, sequela: Secondary | ICD-10-CM

## 2016-04-21 NOTE — Progress Notes (Signed)
[facility]  [address]   Telephone [phone] [address] Fax [phone] [place], [place] [address]   Psychology Progress Note   Name:  Leah Rojas Date of Birth:  1991-06-13 MRN:  [id number]  Date:04/21/2016 (83m) psychotherapy  Leah Rojas reported usually being in good spirits though sometimes feeling "sour" when she compares herself to her pre-injury self. She denied feeling hopeless, or having passive or active thoughts of suicide. She reported that she has been getting along well with her family.She no longer considers anger control a problem. She did volunteer that she has begun to spend too much money on herself and would like to save some money. She did not report engaging in any other possibly impulsive behavior.She also cited sleepiness during the day as an ongoing problem.  I received Dr. Georjean Mode neuropsychological report today and reviewed it. In very general terms I told [name] that she demonstrated some significant cognitive problems  that make returning to work not a viable goal at this time. She stated that she was not surprised by this and expressed mild disappointment.She has a pending appointment with Dr. Wylene Simmer to discuss the evaluation results in depth.   I informed her that I will be leaving my position with [facility] as of 06/10/16. When I brought up the idea of trying to find another provider for her, she stated "I don't want to start over with someone new". I reviewed the benefits of ongoing psychological support. She agreed that she will think about this, discuss with her family and discuss again with me next session.   Observations were as typical with pleasant demeanor, slightly slurred and pressured speech, mildly decreased thought organization and occasional incongruent affect (smiling).  Diagnostic Impressions: Major neurocognitive disorder due to TBI [S06.9X9S, F02.80] Adjustment disorder with depressed mood [F43.21]  We  discussed the importance of her finding meaningful activities to engage in  Her increased spending is at least in part due to having much unstructured time and getting enjoyment from buying stuff. She might want to consider a volunteer position at some point though lack of transportation is an impediment.     Return in one month.   Gladstone Pih, Ph.D Licensed Psychologist

## 2016-04-22 ENCOUNTER — Ambulatory Visit: Payer: Medicaid Other | Admitting: Rehabilitative and Restorative Service Providers"

## 2016-04-22 DIAGNOSIS — F4321 Adjustment disorder with depressed mood: Secondary | ICD-10-CM | POA: Diagnosis present

## 2016-04-22 DIAGNOSIS — H8111 Benign paroxysmal vertigo, right ear: Secondary | ICD-10-CM | POA: Diagnosis not present

## 2016-04-22 DIAGNOSIS — R42 Dizziness and giddiness: Secondary | ICD-10-CM | POA: Diagnosis present

## 2016-04-22 DIAGNOSIS — R2689 Other abnormalities of gait and mobility: Secondary | ICD-10-CM | POA: Diagnosis present

## 2016-04-22 DIAGNOSIS — S069X9S Unspecified intracranial injury with loss of consciousness of unspecified duration, sequela: Secondary | ICD-10-CM | POA: Diagnosis present

## 2016-04-22 DIAGNOSIS — F0280 Dementia in other diseases classified elsewhere without behavioral disturbance: Secondary | ICD-10-CM | POA: Diagnosis present
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
## 2016-04-22 NOTE — Patient Instructions (Signed)
Tip Card 1.The goal of habituation training is to assist in decreasing symptoms of vertigo, dizziness, or nausea provoked by specific head and body motions. 2.These exercises may initially increase symptoms; however, be persistent and work through symptoms. With repetition and time, the exercises will assist in reducing or eliminating symptoms. 3.Exercises should be stopped and discussed with the therapist if you experience any of the following: - Sudden change or fluctuation in hearing - New onset of ringing in the ears, or increase in current intensity - Any fluid discharge from the ear - Severe pain in neck or back - Extreme nausea  Copyright  VHI. All rights reserved.  Sit to Side-Lying   Sit on edge of bed. Lie down onto the right side *while looking up towards the ceiling* and hold until dizziness stops, plus 20 seconds.  Return to sitting and wait until dizziness stops, plus 20 seconds.   Repeat sequence 5 times per session. Do 2 sessions per day.  Copyright  VHI. All rights reserved.  Gaze Stabilization: Tip Card 1.Target must remain in focus, not blurry, and appear stationary while head is in motion. 2.Perform exercises with small head movements (45 to either side of midline). 3.Increase speed of head motion so long as target is in focus. 4.If you wear eyeglasses, be sure you can see target through lens (therapist will give specific instructions for bifocal / progressive lenses). 5.These exercises may provoke dizziness or nausea. Work through these symptoms. If too dizzy, slow head movement slightly. Rest between each exercise. 6.Exercises demand concentration; avoid distractions. 7.For safety, perform standing exercises close to a counter, wall, corner, or next to someone.  Copyright  VHI. All rights reserved.  Gaze Stabilization: Standing Feet Apart   *BEGIN IN A SITTING POSITION. Keep eyes on target on wall 3 feet away, tilt head down slightly and move head side to side for  30 seconds with glasses on.  Remove glasses and repeat x 30 seconds without glasses.  Do 2 sessions per day.   Copyright  VHI. All rights reserved.   Feet Together (Compliant Surface) Varied Arm Positions - Eyes Closed    Stand on compliant surface: _pillow_______ with feet together and arms out. Close eyes and visualize upright position. Hold__20__ seconds. Repeat __3__ times per session. Do __2__ sessions per day.  Copyright  VHI. All rights reserved.   Feet Together (Compliant Surface) Head Motion - Eyes Open    With eyes open, standing on compliant surface: __pillow______, feet together, move head slowly: up and down x 5 reps.  Side to side x 5 reps. Do __2__ sessions per day.  Copyright  VHI. All rights reserved.   Throwing / Tossing (Active)    Toss a Nerf ball or crumpled piece of paper. Use both underhand and overhead motions. Can be done either standing or seated. CAN USE A TENNIS BALL DOING SMALL TOSSES IN RIGHT HAND X 10, walk with doing toss back and forth right to left hands.  Repeat _10___ times. Do ___2_ sessions per day. Activity: Use these motions to throw your junk mail into the recycle bin.  Copyright  VHI. All rights reserved.

## 2016-04-29 ENCOUNTER — Ambulatory Visit: Payer: Medicaid Other | Attending: Psychology | Admitting: Rehabilitative and Restorative Service Providers"

## 2016-04-29 ENCOUNTER — Encounter: Payer: Self-pay | Admitting: Rehabilitative and Restorative Service Providers"

## 2016-04-29 DIAGNOSIS — F0280 Dementia in other diseases classified elsewhere without behavioral disturbance: Secondary | ICD-10-CM | POA: Insufficient documentation

## 2016-04-29 DIAGNOSIS — S069X9S Unspecified intracranial injury with loss of consciousness of unspecified duration, sequela: Secondary | ICD-10-CM | POA: Insufficient documentation

## 2016-04-29 DIAGNOSIS — R2689 Other abnormalities of gait and mobility: Secondary | ICD-10-CM | POA: Diagnosis present

## 2016-04-29 DIAGNOSIS — F4321 Adjustment disorder with depressed mood: Secondary | ICD-10-CM | POA: Insufficient documentation

## 2016-04-29 DIAGNOSIS — R42 Dizziness and giddiness: Secondary | ICD-10-CM | POA: Diagnosis present
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
## 2016-04-29 NOTE — Patient Instructions (Signed)
Oculomotor: Smooth Pursuits    Holding a target, keep eyes on target and slowly move target side to side with head still. Perform sitting. Repeat _5___ times per session. Do __2__ sessions per day. Repeat using target on pattern background.  Copyright  VHI. All rights reserved.  Oculomotor: Saccades    Holding two targets positioned side by side __8__ inches apart, move eyes quickly from target to target as head stays still. Move _2___ seconds each direction. Perform sitting. Repeat _5___ times per session. Do __2__ sessions per day.  Copyright  VHI. All rights reserved.

## 2016-05-02 NOTE — Addendum Note (Signed)
Addended by: Margretta DittyWEAVER, Raphaella Larkin M on: 05/02/2016 08:40 AM   Modules accepted: Orders

## 2016-05-11 ENCOUNTER — Encounter: Payer: Medicaid Other | Attending: Physical Medicine & Rehabilitation | Admitting: Physical Medicine & Rehabilitation

## 2016-05-11 ENCOUNTER — Encounter: Payer: Self-pay | Admitting: Physical Medicine & Rehabilitation

## 2016-05-11 VITALS — BP 122/75 | HR 75 | Resp 14

## 2016-05-11 DIAGNOSIS — S06306S Unspecified focal traumatic brain injury with loss of consciousness greater than 24 hours without return to pre-existing conscious level with patient surviving, sequela: Secondary | ICD-10-CM

## 2016-05-11 DIAGNOSIS — S062X9D Diffuse traumatic brain injury with loss of consciousness of unspecified duration, subsequent encounter: Secondary | ICD-10-CM

## 2016-05-11 DIAGNOSIS — S069X5S Unspecified intracranial injury with loss of consciousness greater than 24 hours with return to pre-existing conscious level, sequela: Secondary | ICD-10-CM | POA: Insufficient documentation

## 2016-05-11 DIAGNOSIS — S069X0S Unspecified intracranial injury without loss of consciousness, sequela: Secondary | ICD-10-CM

## 2016-05-11 DIAGNOSIS — S0990XS Unspecified injury of head, sequela: Secondary | ICD-10-CM

## 2016-05-11 DIAGNOSIS — T148 Other injury of unspecified body region: Secondary | ICD-10-CM | POA: Insufficient documentation

## 2016-05-11 DIAGNOSIS — H8110 Benign paroxysmal vertigo, unspecified ear: Secondary | ICD-10-CM

## 2016-05-11 DIAGNOSIS — S0291XK Unspecified fracture of skull, subsequent encounter for fracture with nonunion: Secondary | ICD-10-CM

## 2016-05-11 DIAGNOSIS — H8113 Benign paroxysmal vertigo, bilateral: Secondary | ICD-10-CM

## 2016-05-11 DIAGNOSIS — F09 Unspecified mental disorder due to known physiological condition: Secondary | ICD-10-CM

## 2016-05-11 DIAGNOSIS — R21 Rash and other nonspecific skin eruption: Secondary | ICD-10-CM | POA: Diagnosis not present

## 2016-05-11 DIAGNOSIS — S06309D Unspecified focal traumatic brain injury with loss of consciousness of unspecified duration, subsequent encounter: Secondary | ICD-10-CM

## 2016-05-11 MED ORDER — TRAZODONE HCL 50 MG PO TABS: 25.0000 mg | ORAL_TABLET | Freq: Every evening | ORAL | 2 refills | Status: DC | PRN

## 2016-05-11 MED ORDER — ACETAMINOPHEN-CODEINE #3 300-30 MG PO TABS: 1.0000 | ORAL_TABLET | Freq: Every day | ORAL | 1 refills | Status: DC | PRN
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
## 2016-05-11 NOTE — Patient Instructions (Signed)
CONTINUE TO WORK ON ROUTINE AND SCHEDULE EACH DAY.   ?VOLUNTEER WORK?   TAKE YOUR TIME WITH WALKING/EXERCISE/PHYSICAL ACTIVITY   WEAR TENNIS SHOES FOR EVERY DAY WALKING   .CALLMD

## 2016-05-16 ENCOUNTER — Encounter: Payer: Self-pay | Admitting: Psychology

## 2016-05-16 ENCOUNTER — Ambulatory Visit (INDEPENDENT_AMBULATORY_CARE_PROVIDER_SITE_OTHER): Payer: Medicaid Other | Admitting: Psychology

## 2016-05-16 DIAGNOSIS — F4321 Adjustment disorder with depressed mood: Secondary | ICD-10-CM | POA: Diagnosis not present

## 2016-05-16 DIAGNOSIS — S069X9S Unspecified intracranial injury with loss of consciousness of unspecified duration, sequela: Secondary | ICD-10-CM

## 2016-05-16 DIAGNOSIS — F0280 Dementia in other diseases classified elsewhere without behavioral disturbance: Secondary | ICD-10-CM

## 2016-05-16 NOTE — Progress Notes (Signed)
[facility]  [address]   Telephone [phone] [address] Fax [phone] [place], [place] [id number]   Psychology Progress Note   Name:  Sharyn BlitzShanese Wardrop Date of Birth:  02/10/1991 MRN:  [id number]  Date:05/16/2016 (4981m) psychotherapy-termination note Final session due to my job change. Reviewed her progress since her TBI and future goals. She expressed distress about the results of her recent neuropsychological evaluation though accepts the conclusions. She is mostly accepting of her limitations and optimistic about her future. She derives self-esteem from being a mother to her three year old daughter. She denied suicidal thoughts, serous mood dysregulation,  substance abuse and engaging in any high risk behavior.  She stated her willingness to continue counseling services with a new provider. She stated that Dr. Riley KillSwartz will arrange for her to see someone. We agreed that it's okay if she has to wait a few weeks to be seen.    Diagnostic Impressions Major neurocognitive disorder due to TBI [S06.9X9S; F02.80] Adjustment disorder with depressed mood [F43.21]   Gladstone PihMichael F. Brinsley Wence, Ph.D Licensed Psychologist

## 2016-05-27 ENCOUNTER — Ambulatory Visit: Payer: Medicaid Other | Attending: Psychology | Admitting: Rehabilitative and Restorative Service Providers"

## 2016-05-27 ENCOUNTER — Encounter: Payer: Self-pay | Admitting: Rehabilitative and Restorative Service Providers"

## 2016-05-27 DIAGNOSIS — R2689 Other abnormalities of gait and mobility: Secondary | ICD-10-CM

## 2016-05-27 DIAGNOSIS — R42 Dizziness and giddiness: Secondary | ICD-10-CM | POA: Insufficient documentation

## 2016-05-27 NOTE — Therapy (Signed)
[facility] [address] [place], [place], [address] Phone: [phone]   Fax:  [phone]  Patient Details  Name: Leah Rojas MRN: [id number] Date of Birth: [birth year] Referring Provider:  No ref. provider found  Encounter Date: 05/27/2016  PHYSICAL THERAPY DISCHARGE SUMMARY  Visits from Start of Care: 4  Current functional level related to goals / functional outcomes:     PT Long Term Goals - 05/27/16 1029      PT LONG TERM GOAL #1   Title The patient will return demo HEP for multi-sensory balance challenges and gaze activities to improve overall mobility. Target date: 06/07/2016   Baseline met on 05/27/2016   Time 4   Period Weeks   Status Achieved     PT LONG TERM GOAL #2   Title The patient will perform static standing with feet together and eyes closed to demo improved balance/postural control x 30 seconds. Target date: 06/07/2016   Baseline Met on 04/29/2016   Time 4   Period Weeks   Status Achieved     PT LONG TERM GOAL #3   Title The patient will perform sit>right sidelying without reports of dizziness or "head not right" to demo improved motion tolerance and reduced dizziness.  Target date: 06/07/2016   Baseline Patient reports no dizziness.  She still notes a mild sensation of tingling.   Time 4   Period Weeks   Status Achieved     PT LONG TERM GOAL #4   Title The patient will improve gait speed from 2.85 ft/sec to > or equal to 3.2 ft/sec for improved community moiblity. Target date: 06/07/2016   Baseline 3.33 ft/sec on 05/27/2016   Time 4   Period Weeks   Status Achieved     PT LONG TERM GOAL #5   Title The patient will improve neuro quality of life for LE score by 14%. Target date: 02/12/2016   Baseline due to length of time since initial survey   Time 4   Period Weeks   Status Deferred        Remaining deficits: Chronic deficits with improving balance, reduction in dizziness and education for  post d/c program.   Education / Equipment: HEP, community wellness, voc rehab, fall safety.  Plan: Patient agrees to discharge.  Patient goals were partially met. Patient is being discharged due to meeting the stated rehab goals.  ?????        Thank you for the referral of this patient. Rudell Cobb, MPT   Taeshawn Helfman 05/27/2016, 11:03 AM  [facility] [address] [place], [place], [address] Phone: [phone]   Fax:  [phone]
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
## 2016-05-27 NOTE — Patient Instructions (Signed)
        ASK ABOUT THEIR SCHOLARSHIP PROGRAM AT THE [employer]

## 2016-06-02 ENCOUNTER — Other Ambulatory Visit: Payer: Self-pay | Admitting: *Deleted

## 2016-06-02 MED ORDER — SERTRALINE HCL 25 MG PO TABS
25.0000 mg | ORAL_TABLET | Freq: Every day | ORAL | 1 refills | Status: DC
Start: 2016-06-02 — End: 2016-08-10

## 2016-07-25 ENCOUNTER — Emergency Department (HOSPITAL_COMMUNITY)
Admission: EM | Admit: 2016-07-25 | Discharge: 2016-07-25 | Disposition: A | Discharge status: Home / Self Care | Payer: Medicaid Other | Attending: Emergency Medicine | Admitting: Emergency Medicine

## 2016-07-25 ENCOUNTER — Encounter (HOSPITAL_COMMUNITY): Payer: Self-pay

## 2016-07-25 DIAGNOSIS — Z87891 Personal history of nicotine dependence: Secondary | ICD-10-CM | POA: Diagnosis not present

## 2016-07-25 DIAGNOSIS — N760 Acute vaginitis: Secondary | ICD-10-CM | POA: Insufficient documentation

## 2016-07-25 DIAGNOSIS — B9689 Other specified bacterial agents as the cause of diseases classified elsewhere: Secondary | ICD-10-CM | POA: Insufficient documentation

## 2016-07-25 DIAGNOSIS — N3001 Acute cystitis with hematuria: Secondary | ICD-10-CM | POA: Diagnosis not present

## 2016-07-25 DIAGNOSIS — N898 Other specified noninflammatory disorders of vagina: Secondary | ICD-10-CM | POA: Diagnosis present

## 2016-07-25 LAB — URINALYSIS, ROUTINE W REFLEX MICROSCOPIC
BILIRUBIN URINE: NEGATIVE
GLUCOSE, UA: NEGATIVE mg/dL
Ketones, ur: NEGATIVE mg/dL
Nitrite: POSITIVE — AB
Protein, ur: NEGATIVE mg/dL
Specific Gravity, Urine: 1.019 (ref 1.005–1.030)
pH: 6.5 (ref 5.0–8.0)

## 2016-07-25 LAB — URINE MICROSCOPIC-ADD ON

## 2016-07-25 LAB — WET PREP, GENITAL
Sperm: NONE SEEN
Trich, Wet Prep: NONE SEEN
Yeast Wet Prep HPF POC: NONE SEEN

## 2016-07-25 LAB — POC URINE PREG, ED: PREG TEST UR: NEGATIVE

## 2016-07-25 MED ORDER — AZITHROMYCIN 250 MG PO TABS
1000.0000 mg | ORAL_TABLET | Freq: Once | ORAL | Status: AC
  Administered 2016-07-25: 1000 mg via ORAL
  Filled 2016-07-25: qty 4

## 2016-07-25 MED ORDER — LIDOCAINE HCL (PF) 1 % IJ SOLN
INTRAMUSCULAR | Status: AC
  Filled 2016-07-25: qty 5

## 2016-07-25 MED ORDER — CEFTRIAXONE SODIUM 250 MG IJ SOLR
250.0000 mg | Freq: Once | INTRAMUSCULAR | Status: AC
  Administered 2016-07-25: 250 mg via INTRAMUSCULAR
  Filled 2016-07-25: qty 250

## 2016-07-25 MED ORDER — LIDOCAINE HCL (PF) 1 % IJ SOLN
INTRAMUSCULAR | Status: AC
  Administered 2016-07-25: 5 mL
  Filled 2016-07-25: qty 5

## 2016-07-25 MED ORDER — CEPHALEXIN 500 MG PO CAPS: 500.0000 mg | ORAL_CAPSULE | Freq: Two times a day (BID) | ORAL | 0 refills | Status: DC

## 2016-07-25 MED ORDER — METRONIDAZOLE 500 MG PO TABS: 500.0000 mg | ORAL_TABLET | Freq: Two times a day (BID) | ORAL | 0 refills | Status: DC

## 2016-07-25 NOTE — ED Triage Notes (Addendum)
Per Pt, Pt is coming from home with complaints of vaginal discharge with a strong odor along with some urinary burning. Pt reports last date of intercourse was about a month ago. Pt has lost twenty pounds in the last three months unintentionally.
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
## 2016-07-25 NOTE — Discharge Instructions (Signed)
Take the prescribed medication as directed.  Do not drink alcohol while taking flagyl, it will make you very sick. If your culture results are abnormal, we will call you. Follow-up with your primary care doctor. Return to the ED for new or worsening symptoms.

## 2016-07-26 LAB — HIV ANTIBODY (ROUTINE TESTING W REFLEX): HIV Screen 4th Generation wRfx: NONREACTIVE

## 2016-07-26 LAB — GC/CHLAMYDIA PROBE AMP (~~LOC~~) NOT AT ARMC
Chlamydia: NEGATIVE
NEISSERIA GONORRHEA: NEGATIVE

## 2016-07-26 LAB — RPR: RPR Ser Ql: NONREACTIVE

## 2016-08-10 ENCOUNTER — Encounter: Payer: Medicaid Other | Attending: Physical Medicine & Rehabilitation | Admitting: Physical Medicine & Rehabilitation

## 2016-08-10 ENCOUNTER — Encounter: Payer: Self-pay | Admitting: Physical Medicine & Rehabilitation

## 2016-08-10 DIAGNOSIS — F09 Unspecified mental disorder due to known physiological condition: Secondary | ICD-10-CM | POA: Insufficient documentation

## 2016-08-10 DIAGNOSIS — S069X5S Unspecified intracranial injury with loss of consciousness greater than 24 hours with return to pre-existing conscious level, sequela: Secondary | ICD-10-CM | POA: Diagnosis not present

## 2016-08-10 DIAGNOSIS — X58XXXS Exposure to other specified factors, sequela: Secondary | ICD-10-CM | POA: Diagnosis not present

## 2016-08-10 DIAGNOSIS — H8113 Benign paroxysmal vertigo, bilateral: Secondary | ICD-10-CM | POA: Diagnosis not present

## 2016-08-10 DIAGNOSIS — S0291XK Unspecified fracture of skull, subsequent encounter for fracture with nonunion: Secondary | ICD-10-CM

## 2016-08-10 DIAGNOSIS — S06309D Unspecified focal traumatic brain injury with loss of consciousness of unspecified duration, subsequent encounter: Secondary | ICD-10-CM

## 2016-08-10 DIAGNOSIS — R21 Rash and other nonspecific skin eruption: Secondary | ICD-10-CM | POA: Diagnosis not present

## 2016-08-10 DIAGNOSIS — S0990XS Unspecified injury of head, sequela: Secondary | ICD-10-CM

## 2016-08-10 MED ORDER — SERTRALINE HCL 25 MG PO TABS: 25.0000 mg | ORAL_TABLET | Freq: Every day | ORAL | 3 refills | Status: DC

## 2016-08-10 MED ORDER — PROPRANOLOL HCL 20 MG PO TABS: 20.0000 mg | ORAL_TABLET | Freq: Three times a day (TID) | ORAL | 3 refills | Status: DC

## 2016-08-10 NOTE — Patient Instructions (Signed)
WORK ON KEEPING A SCHEDULE  LIMIT DISTRACTIONS. DO ONE THING AT A TIME.   PLEASE CALL ME WITH ANY PROBLEMS OR QUESTIONS [phone])
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
## 2016-08-15 IMAGING — CR DG CHEST 1V PORT
1 series · 1 of 1 positions shown · non-contrast
Comparison: January 04, 2015.

CLINICAL DATA: Acute respiratory failure.

EXAM:
PORTABLE CHEST - 1 VIEW

[AP]
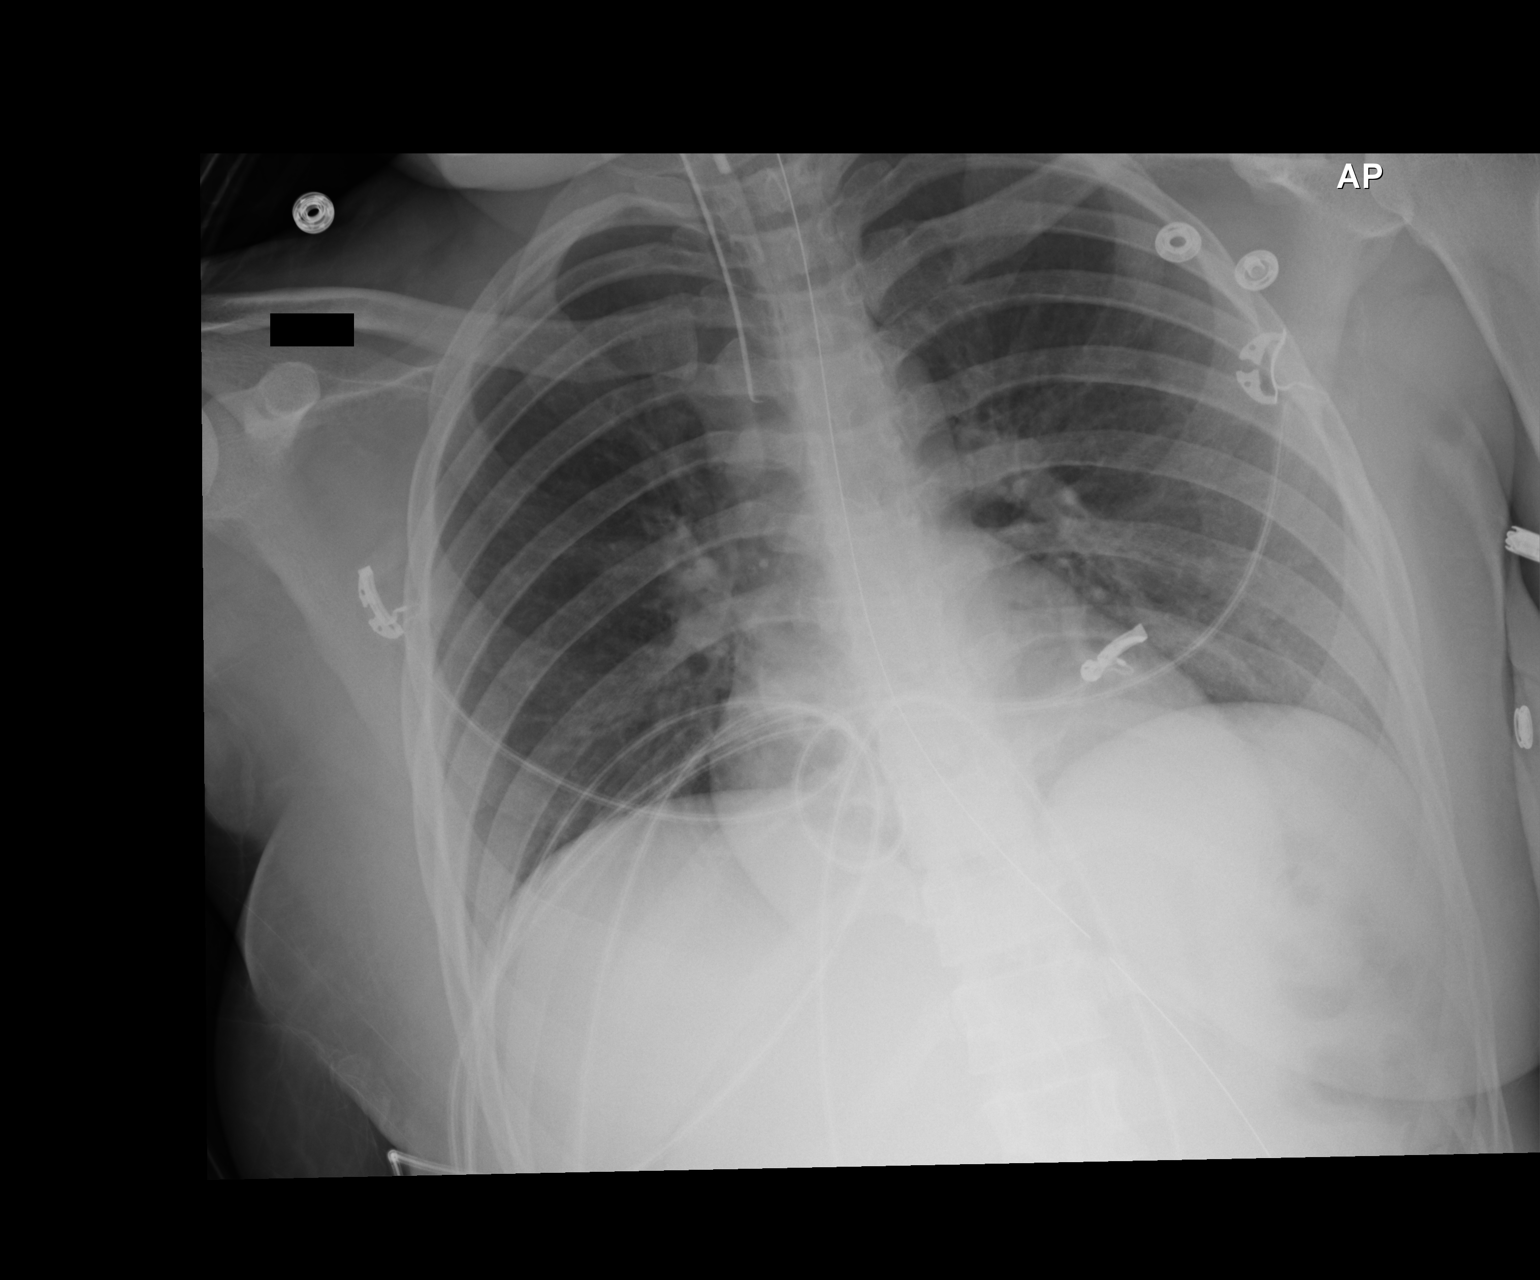

[1 of 1 positions shown; findings below may reference images not displayed]

FINDINGS: The heart size and mediastinal contours are within normal limits.
Both lungs are clear. No pneumothorax or pleural effusion is noted.
Endotracheal tube tip is 3 cm above the carina. The visualized
skeletal structures are unremarkable.
IMPRESSION: Endotracheal tube in grossly good position. No acute cardiopulmonary
abnormality seen.

## 2016-08-21 IMAGING — RF DG SWALLOWING FUNCTION - NRPT [facility]
1 series · 18 of 24 positions shown · non-contrast
Comparison: none

[Series 1: run · 28 acquisitions, 18 frames shown]
[im 1/28]
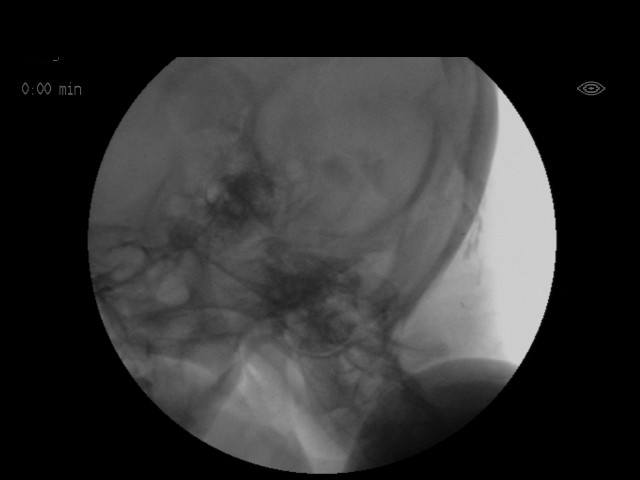
[im 3/28]
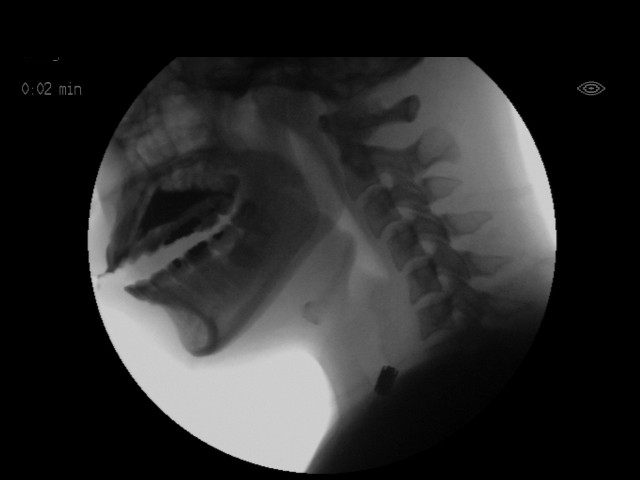
[im 4/28]
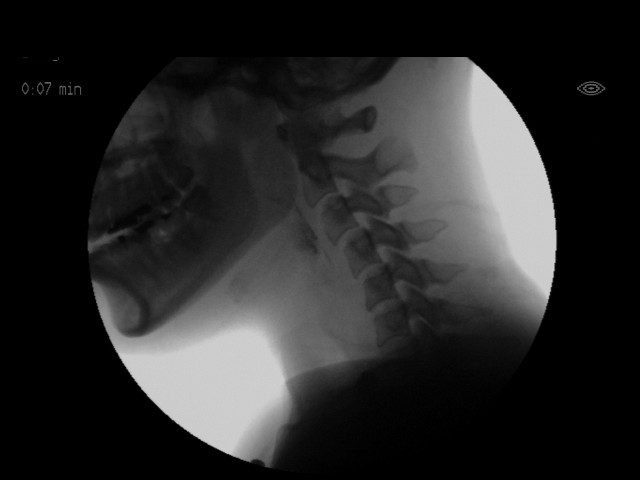
[im 5/28]
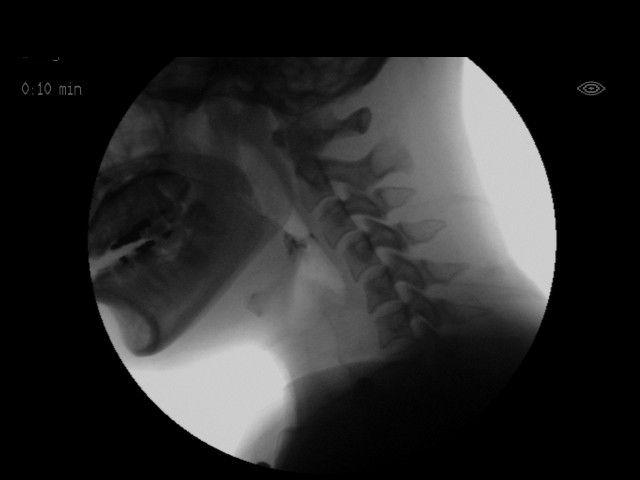
[im 8/28]
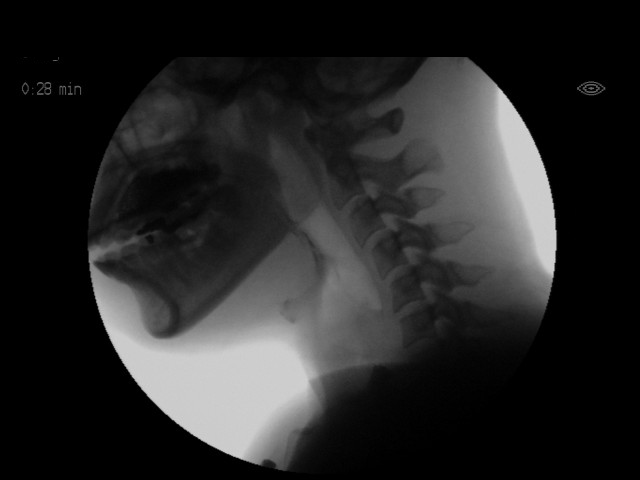
[im 9/28]
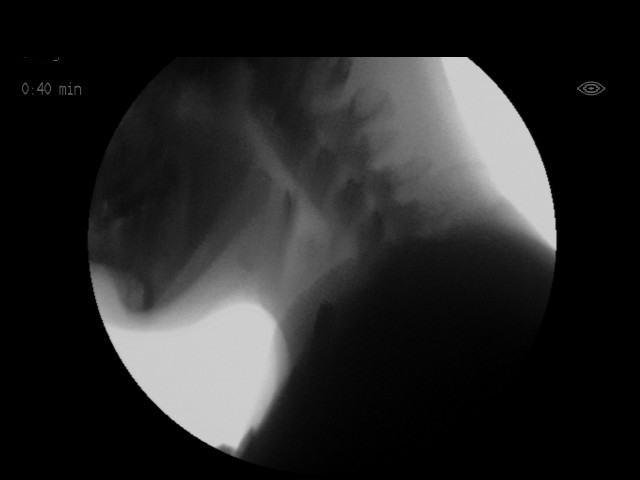
[im 10/28]
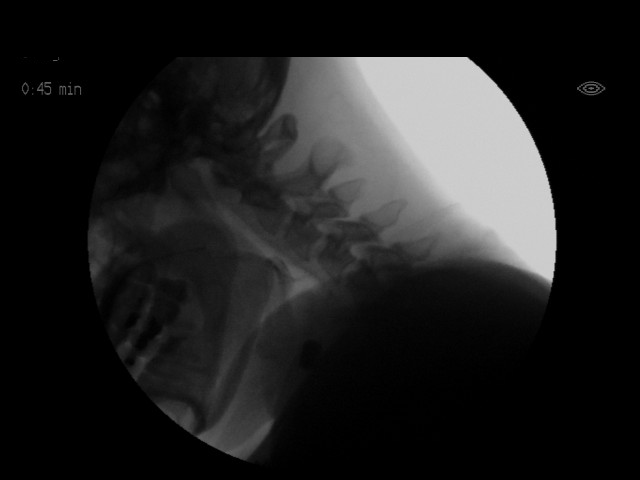
[im 12/28]
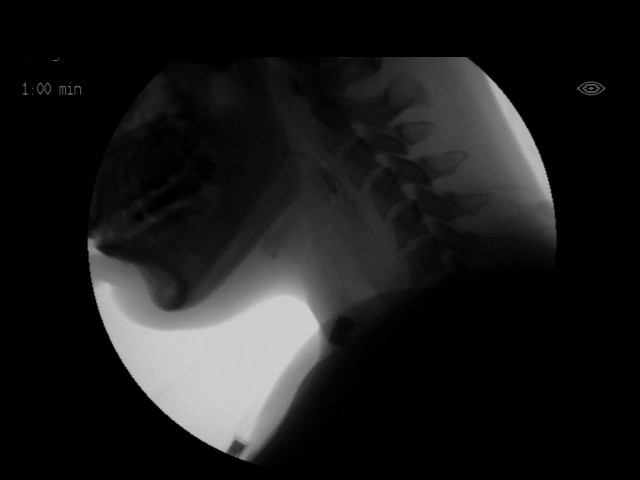
[im 13/28]
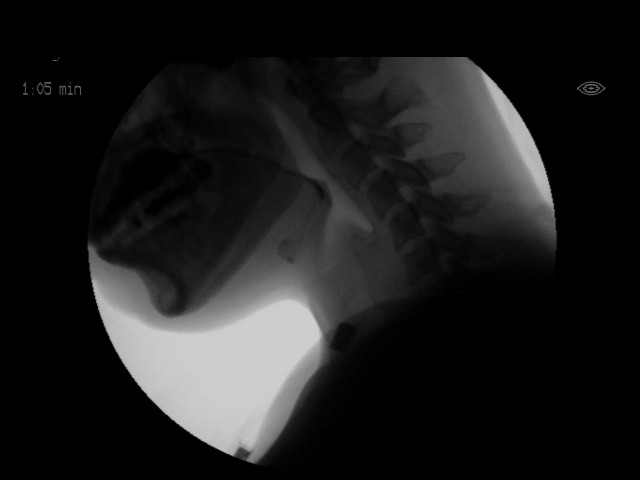
[im 15/28]
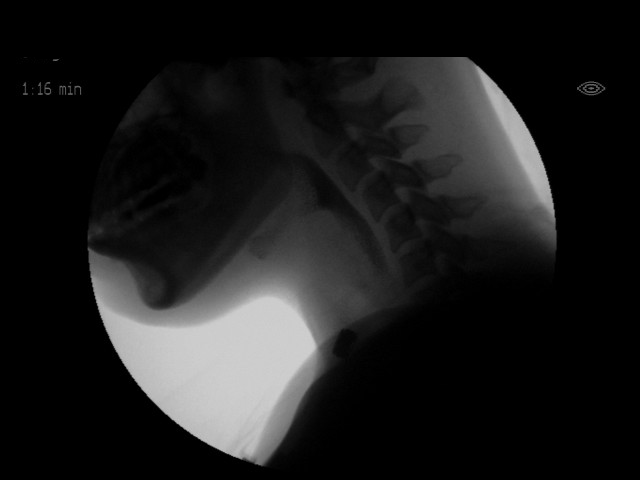
[im 17/28]
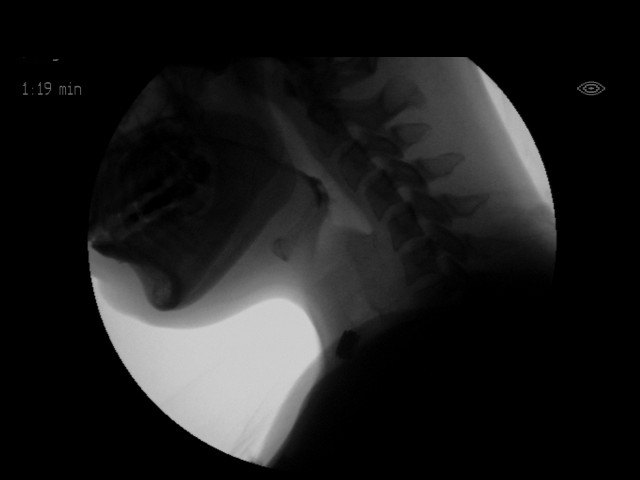
[im 18/28]
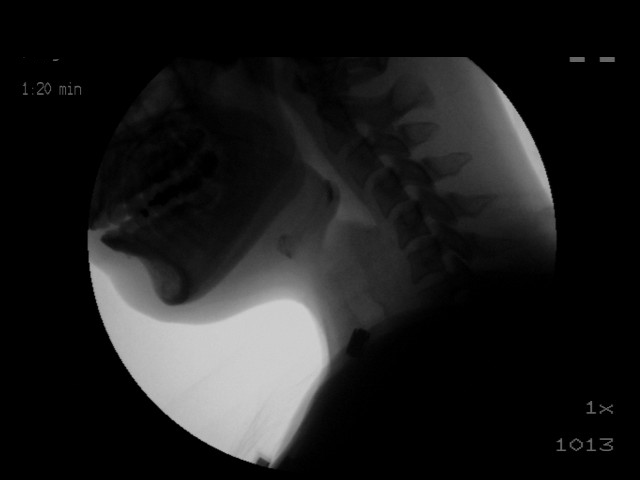
[im 19/28]
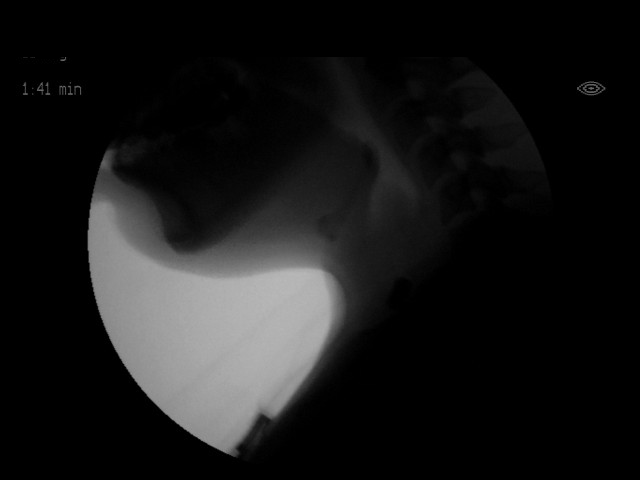
[im 22/28]
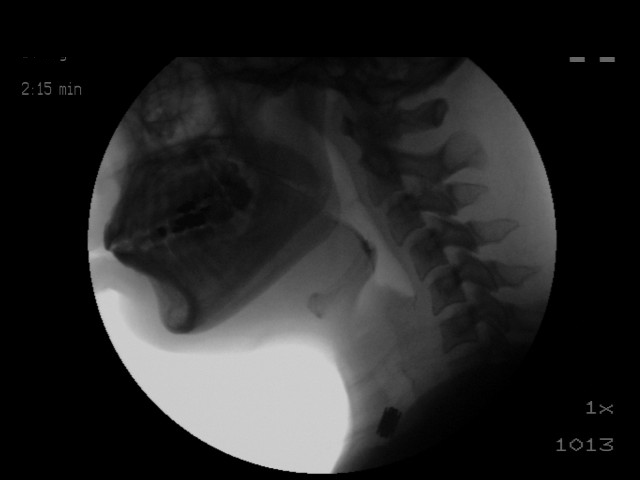
[im 23/28]
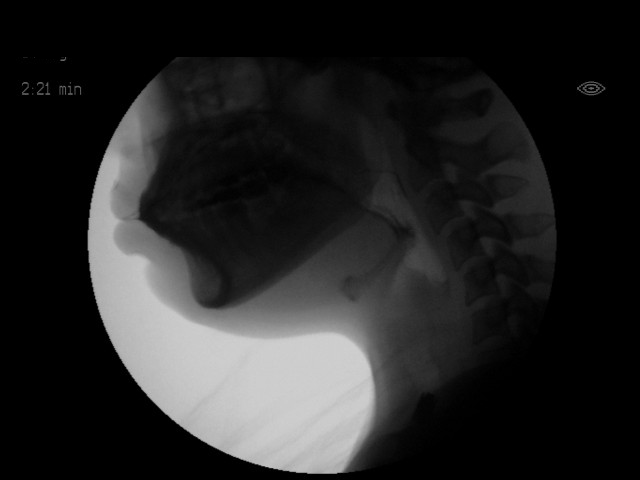
[im 24/28]
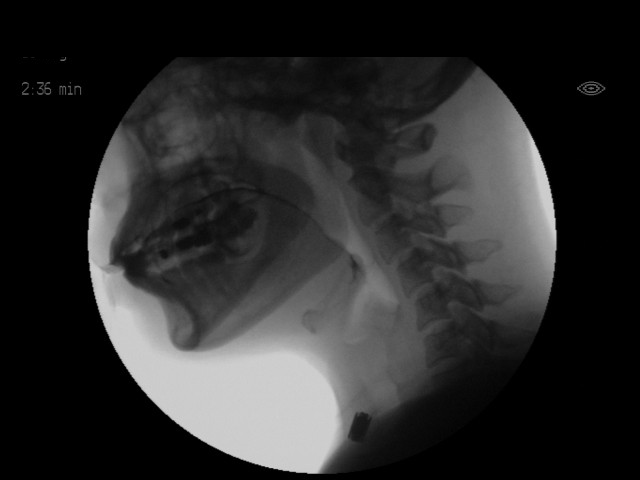
[im 26/28]
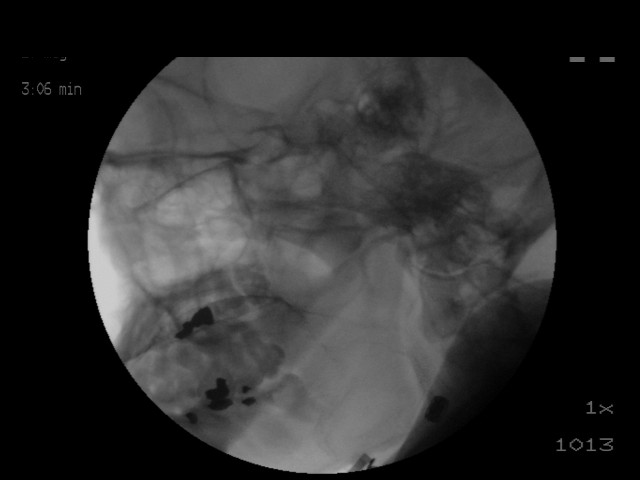
[im 28/28]
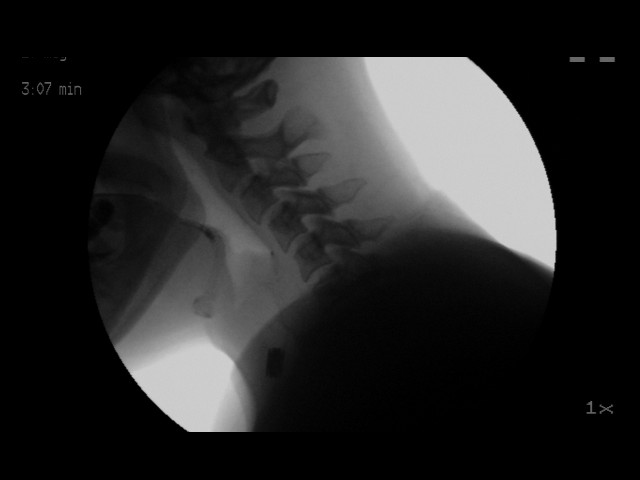

[18 of 24 positions shown; findings below may reference images not displayed]

Canned report from images found in remote index.

Refer to host system for actual result text.

## 2016-10-05 ENCOUNTER — Telehealth: Payer: Self-pay | Admitting: Physical Medicine & Rehabilitation

## 2016-10-05 NOTE — Telephone Encounter (Signed)
There is no reason she couldn't drive to [place] (or fly)
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
## 2016-10-06 NOTE — Telephone Encounter (Signed)
[facility] message with [facility]

## 2016-11-07 ENCOUNTER — Encounter: Payer: Self-pay | Admitting: Physical Medicine & Rehabilitation

## 2016-11-07 ENCOUNTER — Encounter: Payer: Medicaid Other | Attending: Physical Medicine & Rehabilitation | Admitting: Physical Medicine & Rehabilitation

## 2016-11-07 VITALS — BP 127/79 | HR 74 | Resp 14

## 2016-11-07 DIAGNOSIS — F09 Unspecified mental disorder due to known physiological condition: Secondary | ICD-10-CM | POA: Insufficient documentation

## 2016-11-07 DIAGNOSIS — R4689 Other symptoms and signs involving appearance and behavior: Secondary | ICD-10-CM

## 2016-11-07 DIAGNOSIS — H8113 Benign paroxysmal vertigo, bilateral: Secondary | ICD-10-CM

## 2016-11-07 DIAGNOSIS — S0291XK Unspecified fracture of skull, subsequent encounter for fracture with nonunion: Secondary | ICD-10-CM | POA: Diagnosis not present

## 2016-11-07 DIAGNOSIS — S069X5S Unspecified intracranial injury with loss of consciousness greater than 24 hours with return to pre-existing conscious level, sequela: Secondary | ICD-10-CM | POA: Insufficient documentation

## 2016-11-07 DIAGNOSIS — S06309D Unspecified focal traumatic brain injury with loss of consciousness of unspecified duration, subsequent encounter: Secondary | ICD-10-CM

## 2016-11-07 DIAGNOSIS — S0990XS Unspecified injury of head, sequela: Secondary | ICD-10-CM

## 2016-11-07 DIAGNOSIS — R21 Rash and other nonspecific skin eruption: Secondary | ICD-10-CM | POA: Insufficient documentation

## 2016-11-07 DIAGNOSIS — S069X0S Unspecified intracranial injury without loss of consciousness, sequela: Secondary | ICD-10-CM

## 2016-11-07 MED ORDER — PROPRANOLOL HCL 20 MG PO TABS: 20.0000 mg | ORAL_TABLET | Freq: Three times a day (TID) | ORAL | 3 refills | Status: DC

## 2016-11-07 MED ORDER — SERTRALINE HCL 25 MG PO TABS: 25.0000 mg | ORAL_TABLET | Freq: Every day | ORAL | 3 refills | Status: DC
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
## 2016-11-07 NOTE — Patient Instructions (Signed)
[facility] [facility]-----[phone] IS OPERATOR ABOUT PART TIME [employer] ON INPATIENT [facility]

## 2016-11-21 ENCOUNTER — Telehealth: Payer: Self-pay

## 2016-11-21 NOTE — Telephone Encounter (Signed)
Patient mom has called requesting a call back From Dr.Swartz. I mention to mom that Dr. Riley KillSwartz was out of the office this week and if it was an emergency would she like to speak with our RN. Mom states there is no emergency and that she would rather speak with the Provider regarding a personal matter with patient Leah Rojas. Mom can be reached at [phone].

## 2016-11-29 ENCOUNTER — Encounter (HOSPITAL_COMMUNITY): Payer: Self-pay | Admitting: Emergency Medicine

## 2016-11-29 ENCOUNTER — Ambulatory Visit (HOSPITAL_COMMUNITY)
Admission: EM | Admit: 2016-11-29 | Discharge: 2016-11-29 | Disposition: A | Discharge status: Home / Self Care | Payer: Medicaid Other | Attending: Family Medicine | Admitting: Family Medicine

## 2016-11-29 DIAGNOSIS — Z3202 Encounter for pregnancy test, result negative: Secondary | ICD-10-CM

## 2016-11-29 DIAGNOSIS — Z1389 Encounter for screening for other disorder: Secondary | ICD-10-CM

## 2016-11-29 DIAGNOSIS — R109 Unspecified abdominal pain: Secondary | ICD-10-CM | POA: Insufficient documentation

## 2016-11-29 DIAGNOSIS — R300 Dysuria: Secondary | ICD-10-CM | POA: Insufficient documentation

## 2016-11-29 DIAGNOSIS — R319 Hematuria, unspecified: Secondary | ICD-10-CM | POA: Diagnosis present

## 2016-11-29 DIAGNOSIS — Z87891 Personal history of nicotine dependence: Secondary | ICD-10-CM | POA: Diagnosis not present

## 2016-11-29 DIAGNOSIS — R3915 Urgency of urination: Secondary | ICD-10-CM | POA: Insufficient documentation

## 2016-11-29 DIAGNOSIS — R110 Nausea: Secondary | ICD-10-CM | POA: Insufficient documentation

## 2016-11-29 DIAGNOSIS — N3091 Cystitis, unspecified with hematuria: Secondary | ICD-10-CM | POA: Insufficient documentation

## 2016-11-29 LAB — POCT URINALYSIS DIP (DEVICE)
BILIRUBIN URINE: NEGATIVE
Glucose, UA: NEGATIVE mg/dL
KETONES UR: NEGATIVE mg/dL
Nitrite: POSITIVE — AB
Protein, ur: >=300 mg/dL — AB
Specific Gravity, Urine: >=1.03 (ref 1.005–1.030)
Urobilinogen, UA: 0.2 mg/dL (ref 0.0–1.0)
pH: 6.5 (ref 5.0–8.0)

## 2016-11-29 LAB — POCT PREGNANCY, URINE: PREG TEST UR: NEGATIVE

## 2016-11-29 MED ORDER — PHENAZOPYRIDINE HCL 200 MG PO TABS: 200.0000 mg | ORAL_TABLET | Freq: Three times a day (TID) | ORAL | 0 refills | Status: DC | PRN

## 2016-11-29 MED ORDER — ONDANSETRON 4 MG PO TBDP: 4.0000 mg | ORAL_TABLET | Freq: Three times a day (TID) | ORAL | 0 refills | Status: DC | PRN

## 2016-11-29 MED ORDER — CEPHALEXIN 500 MG PO CAPS: 500.0000 mg | ORAL_CAPSULE | Freq: Four times a day (QID) | ORAL | 0 refills | Status: DC

## 2016-11-29 NOTE — Discharge Instructions (Addendum)
You are being treated today for a urinary tract infection. I have prescribed Keflex, take 1 tablet 4 times a day for 5 days. I have also prescribed Pyridium. Take 1 tablet a day 3 times a day for 2 days. Your urine will be sent for culture and you will be notified should any change in therapy be needed. Drink plenty of fluids and rest. For Nausea, I have prescribed Zofran, take 1 tablet under the tongue every 8 hours as needed. Should your symptoms fail to resolve, follow up with your primary care provider or return to clinic.  °

## 2016-11-29 NOTE — ED Provider Notes (Signed)
CSN: [id number]     Arrival date & time 11/29/16  1038 History   None    Chief Complaint  Patient presents with  . Abdominal Pain  . Hematuria   (Consider location/radiation/quality/duration/timing/severity/associated sxs/prior Treatment) 26 year old female presents to clinic with a 24-hour history of increased frequency urgency, dysuria, and chills, along with some nausea. She denies any fever, she denies flank pain, she denies vaginal itching, irritation, she denies discharge, she has no abdominal pain, states she does have pressure in her pelvis. She is not currently on her period, denies pregnancy.   The history is provided by the patient.  Abdominal Pain  Associated symptoms: hematuria   Hematuria  Associated symptoms include abdominal pain.    Past Medical History:  Diagnosis Date  . Bronchitis    rescue inhaler prn  . Traumatic brain injury [facility](HCC)    Past Surgical History:  Procedure Laterality Date  . NO PAST SURGERIES     Family History  Problem Relation Age of Onset  . Heart Problems Mother   . Mental retardation Cousin   . Other Neg Hx   . Colon cancer Neg Hx   . Esophageal cancer Neg Hx   . Stomach cancer Neg Hx   . Pancreatic cancer Neg Hx   . Colon polyps Neg Hx   . Diabetes Neg Hx   . Kidney disease Neg Hx   . Liver disease Neg Hx    Social History  Substance Use Topics  . Smoking status: Former Smoker    Packs/day: 0.25    Types: Cigarettes    Quit date: 12/18/2014  . Smokeless tobacco: Never Used  . Alcohol use No   OB History    Gravida Para Term Preterm AB Living   2 1 1  0 1 1   SAB TAB Ectopic Multiple Live Births   1 0 0 0 1     Review of Systems  Reason unable to perform ROS: As covered in history of present illness.  Gastrointestinal: Positive for abdominal pain.  Genitourinary: Positive for hematuria.  All other systems reviewed and are negative.   Allergies  Patient has no known allergies.  Home Medications   Prior to  Admission medications   Medication Sig Start Date End Date Taking? Authorizing Provider  propranolol (INDERAL) 20 MG tablet Take 1 tablet (20 mg total) by mouth 3 (three) times daily. 11/07/16  Yes Ranelle OysterZachary T Swartz, MD  sertraline (ZOLOFT) 25 MG tablet Take 1 tablet (25 mg total) by mouth daily. 11/07/16  Yes Ranelle OysterZachary T Swartz, MD  cephALEXin (KEFLEX) 500 MG capsule Take 1 capsule (500 mg total) by mouth 4 (four) times daily. 11/29/16   Dorena BodoLawrence Keysi Oelkers, NP  ondansetron (ZOFRAN ODT) 4 MG disintegrating tablet Take 1 tablet (4 mg total) by mouth every 8 (eight) hours as needed for nausea or vomiting. 11/29/16   Dorena BodoLawrence Mesiah Manzo, NP  phenazopyridine (PYRIDIUM) 200 MG tablet Take 1 tablet (200 mg total) by mouth 3 (three) times daily as needed for pain. 11/29/16   Dorena BodoLawrence Carlis Burnsworth, NP   Meds Ordered and Administered this Visit  Medications - No data to display  BP 119/77 (BP Location: Right Arm)   Pulse 73   Temp 97.9 F (36.6 C) (Oral)   Resp 16   LMP 11/09/2016 (Exact Date)   SpO2 100%  No data found.   Physical Exam  Constitutional: She is oriented to person, place, and time. She appears well-developed and well-nourished. No distress.  HENT:  Head:  Normocephalic and atraumatic.  Cardiovascular: Normal rate and regular rhythm.   Pulmonary/Chest: Effort normal and breath sounds normal.  Abdominal: Soft. Bowel sounds are normal. She exhibits no distension and no mass. There is no tenderness. There is no guarding and no CVA tenderness.  Neurological: She is alert and oriented to person, place, and time.  Skin: Skin is warm and dry. Capillary refill takes less than 2 seconds. She is not diaphoretic.  Psychiatric: She has a normal mood and affect.  Nursing note and vitals reviewed.   Urgent Care Course     Procedures (including critical care time)  Labs Review Labs Reviewed  POCT URINALYSIS DIP (DEVICE) - Abnormal; Notable for the following:       Result Value   Hgb urine dipstick LARGE  (*)    Protein, ur >=300 (*)    Nitrite POSITIVE (*)    Leukocytes, UA MODERATE (*)    All other components within normal limits  URINE CULTURE  POCT PREGNANCY, URINE    Imaging Review No results found.      MDM   1. Hemorrhagic cystitis   You are being treated today for a urinary tract infection. I have prescribed Keflex, take 1 tablet 4 times a day for 5 days. I have also prescribed Pyridium. Take 1 tablet a day 3 times a day for 2 days. Your urine will be sent for culture and you will be notified should any change in therapy be needed. Drink plenty of fluids and rest. For Nausea, I have prescribed Zofran, take 1 tablet under the tongue every 8 hours as needed. Should your symptoms fail to resolve, follow up with your primary care provider or return to clinic.      Dorena Bodo, NP 11/29/16 [phone]

## 2016-11-29 NOTE — ED Triage Notes (Signed)
[facility] a [facility] of lower abdominal pain, urinary frequency an urgency and pressure that started yesterday.

## 2016-12-01 ENCOUNTER — Telehealth: Payer: Self-pay | Admitting: Internal Medicine

## 2016-12-01 LAB — URINE CULTURE

## 2016-12-01 MED ORDER — SULFAMETHOXAZOLE-TRIMETHOPRIM 800-160 MG PO TABS: 1.0000 | ORAL_TABLET | Freq: Two times a day (BID) | ORAL | 0 refills | Status: AC

## 2016-12-01 NOTE — Telephone Encounter (Signed)
Clinical staff, please let patient/mother know that urine culture was positive for MRSA germ, resistant to cephalexin but sensitive to trimethoprim/sulfa.  Rx trimethoprim/sulfa sent to pharmacy of record, [facility] [address] [facility] [address].  Stop cephalexin and start trimethoprim/sulfa.  Recheck or followup with PCP for further evaluation if symptoms are not improving.  LM

## 2016-12-14 ENCOUNTER — Telehealth: Payer: Self-pay

## 2016-12-14 NOTE — Telephone Encounter (Signed)
Patients mom would like a call back from Dr. Riley KillSwartz. She states it is a Personnel officerpersonal matter. She can be reached at [phone]

## 2016-12-16 ENCOUNTER — Ambulatory Visit (INDEPENDENT_AMBULATORY_CARE_PROVIDER_SITE_OTHER): Payer: Medicaid Other | Admitting: Physician Assistant

## 2016-12-16 ENCOUNTER — Encounter (INDEPENDENT_AMBULATORY_CARE_PROVIDER_SITE_OTHER): Payer: Self-pay | Admitting: Physician Assistant

## 2016-12-16 VITALS — BP 118/79 | HR 94 | Temp 98.0°F | Ht 65.0 in | Wt 135.8 lb

## 2016-12-16 DIAGNOSIS — Z79899 Other long term (current) drug therapy: Secondary | ICD-10-CM | POA: Diagnosis not present

## 2016-12-16 DIAGNOSIS — N390 Urinary tract infection, site not specified: Secondary | ICD-10-CM | POA: Diagnosis not present

## 2016-12-16 DIAGNOSIS — R319 Hematuria, unspecified: Secondary | ICD-10-CM | POA: Diagnosis not present

## 2016-12-16 LAB — POCT URINALYSIS DIPSTICK
BILIRUBIN UA: NEGATIVE
Blood, UA: NEGATIVE
GLUCOSE UA: NEGATIVE
Ketones, UA: NEGATIVE
NITRITE UA: NEGATIVE
PH UA: 6 (ref 5.0–8.0)
Spec Grav, UA: 1.03 (ref 1.030–1.035)
Urobilinogen, UA: 0.2 (ref ?–2.0)

## 2016-12-16 LAB — POCT URINE PREGNANCY: PREG TEST UR: NEGATIVE

## 2016-12-16 MED ORDER — SULFAMETHOXAZOLE-TRIMETHOPRIM 800-160 MG PO TABS: 1.0000 | ORAL_TABLET | Freq: Two times a day (BID) | ORAL | 0 refills | Status: DC

## 2016-12-16 NOTE — Patient Instructions (Signed)

## 2016-12-16 NOTE — Telephone Encounter (Signed)
I called and talked to mom. She wants me to write a letter regarding Leah Rojas' condition to help get her out of [place]. The lawyer will fax us information next week.

## 2016-12-16 NOTE — Progress Notes (Signed)
Subjective:  Patient ID: Leah Rojas, female    DOB: August 28, 1991  Age: 26 y.o. MRN: [id number]  CC: UTI   HPI Vennessa Affinito is a 26 y.o. female with a PMH of TBI from [place] presents for ED f/u of UTI. Originally rx'ed cephalexin but was found to have MRSA on urine cx susceptible to TMP/Sulfa. Patient has since taken all her medication and is currently asymptomatic.     Outpatient Medications Prior to Visit  Medication Sig Dispense Refill  . propranolol (INDERAL) 20 MG tablet Take 1 tablet (20 mg total) by mouth 3 (three) times daily. 90 tablet 3  . sertraline (ZOLOFT) 25 MG tablet Take 1 tablet (25 mg total) by mouth daily. 30 tablet 3  . ondansetron (ZOFRAN ODT) 4 MG disintegrating tablet Take 1 tablet (4 mg total) by mouth every 8 (eight) hours as needed for nausea or vomiting. (Patient not taking: Reported on 12/16/2016) 20 tablet 0  . cephALEXin (KEFLEX) 500 MG capsule Take 1 capsule (500 mg total) by mouth 4 (four) times daily. 20 capsule 0  . phenazopyridine (PYRIDIUM) 200 MG tablet Take 1 tablet (200 mg total) by mouth 3 (three) times daily as needed for pain. 10 tablet 0   No facility-administered medications prior to visit.      ROS Review of Systems  Constitutional: Negative for chills, fever and malaise/fatigue.  Eyes: Negative for blurred vision.  Respiratory: Negative for shortness of breath.   Cardiovascular: Negative for chest pain and palpitations.  Gastrointestinal: Negative for abdominal pain and nausea.  Genitourinary: Negative for dysuria and hematuria.  Musculoskeletal: Negative for joint pain and myalgias.  Skin: Negative for rash.  Neurological: Negative for tingling and headaches.  Psychiatric/Behavioral: Negative for depression. The patient is not nervous/anxious.     Objective:  BP 118/79 (BP Location: Right Arm, Patient Position: Sitting, Cuff Size: Normal)   Pulse 94   Temp 98 F (36.7 C) (Oral)   Ht 5\' 5"  (1.651 m)   Wt 135 lb 12.8 oz (61.6 kg)    LMP 11/29/2016   SpO2 98%   BMI 22.60 kg/m   BP/Weight 12/16/2016 11/29/2016 11/07/2016  Systolic BP 118 119 127  Diastolic BP 79 77 79  Wt. (Lbs) 135.8 - -  BMI 22.6 - -  Some encounter information is confidential and restricted. Go to Review Flowsheets activity to see all data.      Physical Exam  Constitutional: She is oriented to person, place, and time.  Well developed, well nourished, NAD, polite  HENT:  Head: Normocephalic and atraumatic.  Eyes: No scleral icterus.  Cardiovascular: Normal rate, regular rhythm and normal heart sounds.   Pulmonary/Chest: Effort normal and breath sounds normal.  Abdominal: Soft. Bowel sounds are normal. She exhibits no distension. There is no tenderness. There is no rebound and no guarding.  Musculoskeletal: She exhibits no edema.  Neurological: She is alert and oriented to person, place, and time. Coordination normal.  Mildly slurred speech which is baseline for pt due to her TBI from [place].  Skin: Skin is warm and dry. No rash noted. No erythema. No pallor.  Psychiatric: She has a normal mood and affect. Her behavior is normal. Thought content normal.  Vitals reviewed.    Assessment & Plan:   1. Urinary tract infection with hematuria, site unspecified - Recent MRSA UTI susceptible to Bactrim. - Urinalysis Dipstick with small Leukocytes today. - Urine culture  2. High risk medication use - POCT urine pregnancy negative   Meds ordered  this encounter  Medications  . sulfamethoxazole-trimethoprim (BACTRIM DS,SEPTRA DS) 800-160 MG tablet    Sig: Take 1 tablet by mouth 2 (two) times daily.    Dispense:  14 tablet    Refill:  0    Order Specific Question:   Supervising Provider    Answer:   Quentin AngstJEGEDE, OLUGBEMIGA E [[id number]]    Follow-up: Return in about 7 days (around 12/23/2016) for Nurse visit. UA only.   Loletta Specteroger David Tynika Luddy PA

## 2016-12-18 LAB — URINE CULTURE: Organism ID, Bacteria: NO GROWTH

## 2017-01-09 ENCOUNTER — Ambulatory Visit (INDEPENDENT_AMBULATORY_CARE_PROVIDER_SITE_OTHER): Payer: Self-pay | Admitting: Physician Assistant

## 2017-01-10 ENCOUNTER — Telehealth: Payer: Self-pay | Admitting: Physician Assistant

## 2017-01-10 NOTE — Telephone Encounter (Signed)
Pt states someone called her today from the clinic and they were disconnected. Pt would like someone to call her back about lab results. ep

## 2017-01-11 NOTE — Telephone Encounter (Signed)
Please call this patient and see what she may be talking about. Records show that she had a urine cx ordered on 12/16/16 and that you talked to pt on 12/19/16 about the results.

## 2017-01-13 NOTE — Telephone Encounter (Signed)
Patient had concerns of urinary symptoms returning, patient not having any symptoms currently, attempted to schedule appointment patient stated she would wait and call back to schedule. Maryjean Morn, CMA

## 2017-01-31 ENCOUNTER — Ambulatory Visit (INDEPENDENT_AMBULATORY_CARE_PROVIDER_SITE_OTHER): Payer: Self-pay | Admitting: Physician Assistant

## 2017-02-08 ENCOUNTER — Encounter (INDEPENDENT_AMBULATORY_CARE_PROVIDER_SITE_OTHER): Payer: Self-pay | Admitting: Physician Assistant

## 2017-02-08 ENCOUNTER — Other Ambulatory Visit (HOSPITAL_COMMUNITY)
Admission: RE | Admit: 2017-02-08 | Discharge: 2017-02-08 | Disposition: A | Discharge status: Home / Self Care | Payer: Medicaid Other | Source: Ambulatory Visit | Attending: Physician Assistant | Admitting: Physician Assistant

## 2017-02-08 ENCOUNTER — Ambulatory Visit (INDEPENDENT_AMBULATORY_CARE_PROVIDER_SITE_OTHER): Payer: Medicaid Other | Admitting: Physician Assistant

## 2017-02-08 VITALS — BP 112/76 | HR 78 | Temp 98.3°F | Wt 134.4 lb

## 2017-02-08 DIAGNOSIS — Z124 Encounter for screening for malignant neoplasm of cervix: Secondary | ICD-10-CM

## 2017-02-08 DIAGNOSIS — A5901 Trichomonal vulvovaginitis: Secondary | ICD-10-CM | POA: Diagnosis not present

## 2017-02-08 DIAGNOSIS — B9689 Other specified bacterial agents as the cause of diseases classified elsewhere: Secondary | ICD-10-CM | POA: Diagnosis not present

## 2017-02-08 DIAGNOSIS — N390 Urinary tract infection, site not specified: Secondary | ICD-10-CM | POA: Diagnosis not present

## 2017-02-08 LAB — POCT URINALYSIS DIPSTICK
Bilirubin, UA: NEGATIVE
Glucose, UA: NEGATIVE
KETONES UA: NEGATIVE
Leukocytes, UA: NEGATIVE
Nitrite, UA: NEGATIVE
RBC UA: NEGATIVE
SPEC GRAV UA: 1.02 (ref 1.010–1.025)
Urobilinogen, UA: 0.2 E.U./dL
pH, UA: 7 (ref 5.0–8.0)

## 2017-02-08 NOTE — Progress Notes (Signed)
  Subjective:  Patient ID: Leah Rojas, female    DOB: 03/27/1991  Age: 26 y.o. MRN: [id number]  CC: repeat UA  HPI Leah Rojas is a 26 y.o. female with a PMH of TBI presents to have a repeat UA to ensure UTI is gone. She is feeling much better in regards to symptoms. Thinks she may have noticed a genitourinary odor but is unsure. Has taken her medications as directed. Denies any other symptoms.      Outpatient Medications Prior to Visit  Medication Sig Dispense Refill  . propranolol (INDERAL) 20 MG tablet Take 1 tablet (20 mg total) by mouth 3 (three) times daily. 90 tablet 3  . sertraline (ZOLOFT) 25 MG tablet Take 1 tablet (25 mg total) by mouth daily. 30 tablet 3  . ondansetron (ZOFRAN ODT) 4 MG disintegrating tablet Take 1 tablet (4 mg total) by mouth every 8 (eight) hours as needed for nausea or vomiting. (Patient not taking: Reported on 12/16/2016) 20 tablet 0  . sulfamethoxazole-trimethoprim (BACTRIM DS,SEPTRA DS) 800-160 MG tablet Take 1 tablet by mouth 2 (two) times daily. 14 tablet 0   No facility-administered medications prior to visit.      ROS Review of Systems  Constitutional: Negative for chills, fever and malaise/fatigue.  Eyes: Negative for blurred vision.  Respiratory: Negative for shortness of breath.   Cardiovascular: Negative for chest pain and palpitations.  Gastrointestinal: Negative for abdominal pain and nausea.  Genitourinary: Negative for dysuria and hematuria.       Genitourinary odor  Musculoskeletal: Negative for joint pain and myalgias.  Skin: Negative for rash.  Neurological: Negative for tingling and headaches.  Psychiatric/Behavioral: Negative for depression. The patient is not nervous/anxious.     Objective:  BP 112/76 (BP Location: Right Arm, Patient Position: Sitting, Cuff Size: Normal)   Pulse 78   Temp 98.3 F (36.8 C) (Oral)   Wt 134 lb 6.4 oz (61 kg)   LMP 01/28/2017   SpO2 96%   BMI 22.37 kg/m   BP/Weight 02/08/2017 12/16/2016  11/29/2016  Systolic BP 112 118 119  Diastolic BP 76 79 77  Wt. (Lbs) 134.4 135.8 -  BMI 22.37 22.6 -  Some encounter information is confidential and restricted. Go to Review Flowsheets activity to see all data.      Physical Exam  Constitutional: She is oriented to person, place, and time.  Well developed, well nourished, NAD, polite  HENT:  Head: Normocephalic and atraumatic.  Eyes: No scleral icterus.  Neck: Normal range of motion.  Cardiovascular: Normal rate, regular rhythm and normal heart sounds.   Pulmonary/Chest: Effort normal and breath sounds normal.  Genitourinary:  Genitourinary Comments: Cervix is friable and erythematous with copious mucoid drainage from os. Mild left sided adnexal TTP, no right sided adnexal TTP. No uterine mass palpated.  Musculoskeletal: She exhibits no edema.  Neurological: She is alert and oriented to person, place, and time.  Skin: Skin is warm and dry. No rash noted. No erythema. No pallor.  No genital lesions  Psychiatric: She has a normal mood and affect. Her behavior is normal. Thought content normal.  Vitals reviewed.    Assessment & Plan:   1. Urinary tract infection without hematuria, site unspecified - Urinalysis Dipstick normal in clinic today. Pt asymptomatic.  2. Pap smear for cervical cancer screening - Cytology - PAP [place]     Follow-up: Return if symptoms worsen or fail to improve.   Loletta Specteroger David Gomez PA

## 2017-02-08 NOTE — Patient Instructions (Signed)

## 2017-02-09 LAB — CYTOLOGY - PAP
Bacterial vaginitis: POSITIVE — AB
Candida vaginitis: NEGATIVE
Chlamydia: NEGATIVE
Diagnosis: NEGATIVE
Neisseria Gonorrhea: NEGATIVE
Trichomonas: POSITIVE — AB

## 2017-02-10 ENCOUNTER — Other Ambulatory Visit (INDEPENDENT_AMBULATORY_CARE_PROVIDER_SITE_OTHER): Payer: Self-pay | Admitting: Physician Assistant

## 2017-02-10 DIAGNOSIS — N760 Acute vaginitis: Secondary | ICD-10-CM

## 2017-02-10 DIAGNOSIS — B9689 Other specified bacterial agents as the cause of diseases classified elsewhere: Secondary | ICD-10-CM

## 2017-02-10 DIAGNOSIS — A599 Trichomoniasis, unspecified: Secondary | ICD-10-CM

## 2017-02-10 MED ORDER — METRONIDAZOLE 500 MG PO TABS: 500.0000 mg | ORAL_TABLET | Freq: Two times a day (BID) | ORAL | 0 refills | Status: AC

## 2017-02-10 NOTE — Progress Notes (Signed)
Trichomonas, BV positive

## 2017-02-15 LAB — CERVICOVAGINAL ANCILLARY ONLY: Herpes: NEGATIVE

## 2017-02-22 ENCOUNTER — Encounter: Payer: Self-pay | Admitting: Physical Medicine & Rehabilitation

## 2017-03-06 ENCOUNTER — Encounter: Payer: Self-pay | Admitting: Physical Medicine & Rehabilitation

## 2017-03-06 ENCOUNTER — Ambulatory Visit: Payer: Self-pay | Admitting: Physical Medicine & Rehabilitation

## 2017-03-06 ENCOUNTER — Encounter: Payer: Medicaid Other | Attending: Physical Medicine & Rehabilitation | Admitting: Physical Medicine & Rehabilitation

## 2017-03-06 VITALS — BP 116/78 | HR 68

## 2017-03-06 DIAGNOSIS — R51 Headache: Secondary | ICD-10-CM | POA: Diagnosis not present

## 2017-03-06 DIAGNOSIS — S069X0S Unspecified intracranial injury without loss of consciousness, sequela: Secondary | ICD-10-CM | POA: Diagnosis not present

## 2017-03-06 DIAGNOSIS — Z87820 Personal history of traumatic brain injury: Secondary | ICD-10-CM | POA: Diagnosis not present

## 2017-03-06 DIAGNOSIS — Z5189 Encounter for other specified aftercare: Secondary | ICD-10-CM | POA: Diagnosis not present

## 2017-03-06 DIAGNOSIS — R4689 Other symptoms and signs involving appearance and behavior: Secondary | ICD-10-CM

## 2017-03-06 DIAGNOSIS — S06306S Unspecified focal traumatic brain injury with loss of consciousness greater than 24 hours without return to pre-existing conscious level with patient surviving, sequela: Secondary | ICD-10-CM | POA: Diagnosis not present

## 2017-03-06 DIAGNOSIS — Z87891 Personal history of nicotine dependence: Secondary | ICD-10-CM | POA: Insufficient documentation
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
## 2017-03-06 NOTE — Patient Instructions (Signed)
YOU MAY USE IBUPROFEN OR NAPROXEN FOR YOUR HEADACHES AS WELL AS IBUPROFEN.   GET IN THE POOL AND WORK ON SOME STRENGTHENING EXERCISES DAILY  THINK ABOUT YOUR HEAD POSITION WHEN YOU SLEEP    PLEASE FEEL FREE TO CALL OUR OFFICE WITH ANY PROBLEMS OR QUESTIONS [phone])

## 2017-06-17 ENCOUNTER — Other Ambulatory Visit: Payer: Self-pay | Admitting: Physical Medicine & Rehabilitation

## 2017-06-17 DIAGNOSIS — S0990XS Unspecified injury of head, sequela: Secondary | ICD-10-CM

## 2017-06-17 DIAGNOSIS — S0291XK Unspecified fracture of skull, subsequent encounter for fracture with nonunion: Secondary | ICD-10-CM

## 2017-06-17 DIAGNOSIS — S06309D Unspecified focal traumatic brain injury with loss of consciousness of unspecified duration, subsequent encounter: Secondary | ICD-10-CM

## 2017-06-17 DIAGNOSIS — F09 Unspecified mental disorder due to known physiological condition: Secondary | ICD-10-CM

## 2017-06-17 DIAGNOSIS — H8113 Benign paroxysmal vertigo, bilateral: Secondary | ICD-10-CM

## 2017-07-05 ENCOUNTER — Encounter: Payer: Medicare Other | Attending: Physical Medicine & Rehabilitation | Admitting: Physical Medicine & Rehabilitation

## 2017-07-05 ENCOUNTER — Encounter: Payer: Self-pay | Admitting: Physical Medicine & Rehabilitation

## 2017-07-05 VITALS — BP 121/75 | HR 62

## 2017-07-05 DIAGNOSIS — R51 Headache: Secondary | ICD-10-CM | POA: Diagnosis not present

## 2017-07-05 DIAGNOSIS — R4689 Other symptoms and signs involving appearance and behavior: Secondary | ICD-10-CM

## 2017-07-05 DIAGNOSIS — F09 Unspecified mental disorder due to known physiological condition: Secondary | ICD-10-CM | POA: Diagnosis not present

## 2017-07-05 DIAGNOSIS — H8110 Benign paroxysmal vertigo, unspecified ear: Secondary | ICD-10-CM | POA: Diagnosis not present

## 2017-07-05 DIAGNOSIS — Z5189 Encounter for other specified aftercare: Secondary | ICD-10-CM | POA: Insufficient documentation

## 2017-07-05 DIAGNOSIS — Z87820 Personal history of traumatic brain injury: Secondary | ICD-10-CM | POA: Diagnosis not present

## 2017-07-05 DIAGNOSIS — S069X0S Unspecified intracranial injury without loss of consciousness, sequela: Secondary | ICD-10-CM

## 2017-07-05 DIAGNOSIS — Z87891 Personal history of nicotine dependence: Secondary | ICD-10-CM | POA: Diagnosis not present

## 2017-07-05 DIAGNOSIS — S0990XS Unspecified injury of head, sequela: Secondary | ICD-10-CM | POA: Diagnosis not present

## 2017-07-05 NOTE — Patient Instructions (Signed)
CONTACT ROBERT BESSEY WITH [facility].  [phone]
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
## 2017-10-05 ENCOUNTER — Encounter (INDEPENDENT_AMBULATORY_CARE_PROVIDER_SITE_OTHER): Payer: Self-pay | Admitting: Physician Assistant

## 2017-10-05 ENCOUNTER — Other Ambulatory Visit: Payer: Self-pay

## 2017-10-05 ENCOUNTER — Ambulatory Visit (INDEPENDENT_AMBULATORY_CARE_PROVIDER_SITE_OTHER): Payer: Medicare Other | Admitting: Physician Assistant

## 2017-10-05 VITALS — BP 118/77 | HR 85 | Temp 98.1°F | Wt 129.4 lb

## 2017-10-05 DIAGNOSIS — Z30013 Encounter for initial prescription of injectable contraceptive: Secondary | ICD-10-CM | POA: Diagnosis not present

## 2017-10-05 LAB — POCT URINE PREGNANCY: Preg Test, Ur: NEGATIVE

## 2017-10-05 MED ORDER — MEDROXYPROGESTERONE ACETATE 150 MG/ML IM SUSP
150.0000 mg | Freq: Once | INTRAMUSCULAR | Status: AC
  Administered 2017-10-05: 150 mg via INTRAMUSCULAR

## 2017-10-05 NOTE — Progress Notes (Signed)
   Subjective:  Patient ID: Leah Rojas, female    DOB: 03/20/1991  Age: 27 y.o. MRN: [id number]  CC: contraception  HPI Leah BlitzShanese Dowda is a 27 y.o. female, G2P1 with a medical history of bronchitis and traumatic brain injury presents to discuss contraception. Would like to receive depo provera. Does not endorse history of coagulopathy, smoking, cancer, liver pathology, unusual vaginal bleeding, or adverse reaction to previous Depo-Provera use. Does not plan a pregnancy within the next year. Does not endorse any other symptoms or complaints.       Outpatient Medications Prior to Visit  Medication Sig Dispense Refill  . propranolol (INDERAL) 20 MG tablet TAKE 1 TABLET BY MOUTH 3 TIMES A DAY 90 tablet 3  . sertraline (ZOLOFT) 25 MG tablet Take 1 tablet (25 mg total) by mouth daily. 30 tablet 3   No facility-administered medications prior to visit.      ROS Review of Systems  Constitutional: Negative for chills, fever and malaise/fatigue.  Eyes: Negative for blurred vision.  Respiratory: Negative for shortness of breath.   Cardiovascular: Negative for chest pain and palpitations.  Gastrointestinal: Negative for abdominal pain and nausea.  Genitourinary: Negative for dysuria and hematuria.  Musculoskeletal: Negative for joint pain and myalgias.  Skin: Negative for rash.  Neurological: Negative for tingling and headaches.  Psychiatric/Behavioral: Negative for depression. The patient is not nervous/anxious.     Objective:  Wt 129 lb 6.4 oz (58.7 kg)   LMP 09/09/2017 (Exact Date)   BMI 21.53 kg/m   BP/Weight 10/05/2017 07/05/2017 03/06/2017  Systolic BP - 121 116  Diastolic BP - 75 78  Wt. (Lbs) 129.4 - -  BMI 21.53 - -  Some encounter information is confidential and restricted. Go to Review Flowsheets activity to see all data.      Physical Exam  Constitutional: She is oriented to person, place, and time.  Well developed, well nourished, NAD, polite  HENT:  Head:  Normocephalic and atraumatic.  Eyes: No scleral icterus.  Neck: Normal range of motion. Neck supple. No thyromegaly present.  Cardiovascular: Normal rate, regular rhythm and normal heart sounds.  Pulmonary/Chest: Effort normal and breath sounds normal.  Abdominal: Soft. Bowel sounds are normal. There is no tenderness.  No hepatomegaly  Musculoskeletal: She exhibits no edema.  Neurological: She is alert and oriented to person, place, and time.  Skin: Skin is warm and dry. No rash noted. No erythema. No pallor.  Psychiatric: She has a normal mood and affect. Her behavior is normal. Thought content normal.  Vitals reviewed.    Assessment & Plan:   1. Encounter for initial prescription of injectable contraceptive - POCT urine pregnancy negative in clinic today. - Administered medroxyPROGESTERone (DEPO-PROVERA) injection 150 mg   Meds ordered this encounter  Medications  . medroxyPROGESTERone (DEPO-PROVERA) injection 150 mg    Follow-up: Return in about 13 weeks (around 01/04/2018) for Depo Shot.   Loletta Specteroger David Jadyn Barge PA

## 2017-10-05 NOTE — Patient Instructions (Signed)

## 2017-11-26 ENCOUNTER — Other Ambulatory Visit: Payer: Self-pay | Admitting: Physical Medicine & Rehabilitation

## 2017-11-26 DIAGNOSIS — S0990XS Unspecified injury of head, sequela: Secondary | ICD-10-CM

## 2017-11-26 DIAGNOSIS — S0291XK Unspecified fracture of skull, subsequent encounter for fracture with nonunion: Secondary | ICD-10-CM

## 2017-11-26 DIAGNOSIS — H8113 Benign paroxysmal vertigo, bilateral: Secondary | ICD-10-CM

## 2017-11-26 DIAGNOSIS — F09 Unspecified mental disorder due to known physiological condition: Secondary | ICD-10-CM

## 2017-11-26 DIAGNOSIS — S06309D Unspecified focal traumatic brain injury with loss of consciousness of unspecified duration, subsequent encounter: Secondary | ICD-10-CM

## 2018-01-03 ENCOUNTER — Encounter: Payer: Medicare Other | Admitting: Physical Medicine & Rehabilitation

## 2018-01-04 ENCOUNTER — Encounter (INDEPENDENT_AMBULATORY_CARE_PROVIDER_SITE_OTHER): Payer: Self-pay | Admitting: Physician Assistant

## 2018-01-04 ENCOUNTER — Other Ambulatory Visit: Payer: Self-pay

## 2018-01-04 ENCOUNTER — Ambulatory Visit (INDEPENDENT_AMBULATORY_CARE_PROVIDER_SITE_OTHER): Payer: Medicare Other | Admitting: Physician Assistant

## 2018-01-04 VITALS — BP 114/72 | HR 80 | Temp 98.6°F | Ht 65.0 in | Wt 126.6 lb

## 2018-01-04 DIAGNOSIS — Z3042 Encounter for surveillance of injectable contraceptive: Secondary | ICD-10-CM | POA: Diagnosis not present

## 2018-01-04 DIAGNOSIS — F17200 Nicotine dependence, unspecified, uncomplicated: Secondary | ICD-10-CM | POA: Diagnosis not present

## 2018-01-04 LAB — POCT URINE PREGNANCY: Preg Test, Ur: NEGATIVE

## 2018-01-04 MED ORDER — NICOTINE 7 MG/24HR TD PT24: 7.0000 mg | MEDICATED_PATCH | Freq: Every day | TRANSDERMAL | 0 refills | Status: DC

## 2018-01-04 MED ORDER — NICOTINE 14 MG/24HR TD PT24: 14.0000 mg | MEDICATED_PATCH | Freq: Every day | TRANSDERMAL | 0 refills | Status: DC

## 2018-01-04 MED ORDER — MEDROXYPROGESTERONE ACETATE 150 MG/ML IM SUSP: 150.0000 mg | Freq: Once | INTRAMUSCULAR | 0 refills | Status: DC

## 2018-01-04 MED ORDER — NICOTINE 21 MG/24HR TD PT24: 21.0000 mg | MEDICATED_PATCH | Freq: Every day | TRANSDERMAL | 0 refills | Status: DC

## 2018-01-04 MED ORDER — MEDROXYPROGESTERONE ACETATE 150 MG/ML IM SUSP
150.0000 mg | Freq: Once | INTRAMUSCULAR | Status: DC
  Administered 2018-01-04: 150 mg via INTRAMUSCULAR

## 2018-01-04 NOTE — Progress Notes (Signed)
Subjective:  Patient ID: Leah Rojas, female    DOB: [birth year]  Age: 27 y.o. MRN: [id number]  CC: depot shot  HPI Leah Rojas is a 27 y.o. female, G2P1 with a medical history of bronchitis and traumatic brain injury presents for second depo-provera shot. First injection three months ago. Denies swelling, rash, SOB, change in mood, f/c/n/v, abdominal pain, chest pain, HA, or breast tenderness. Still smoking cigarettes, maybe 10 per day. Has never tried any smoking cessation products.     Outpatient Medications Prior to Visit  Medication Sig Dispense Refill  . propranolol (INDERAL) 20 MG tablet TAKE 1 TABLET BY MOUTH 3 TIMES A DAY 90 tablet 3  . sertraline (ZOLOFT) 25 MG tablet TAKE 1 TABLET BY MOUTH EVERY DAY 30 tablet 2   No facility-administered medications prior to visit.      ROS Review of Systems  Constitutional: Negative for chills, fever and malaise/fatigue.  Eyes: Negative for blurred vision.  Respiratory: Negative for shortness of breath.   Cardiovascular: Negative for chest pain and palpitations.  Gastrointestinal: Negative for abdominal pain and nausea.  Genitourinary: Negative for dysuria and hematuria.  Musculoskeletal: Negative for joint pain and myalgias.  Skin: Negative for rash.  Neurological: Negative for tingling and headaches.  Psychiatric/Behavioral: Negative for depression. The patient is not nervous/anxious.     Objective:  BP 114/72 (BP Location: Left Arm, Patient Position: Sitting, Cuff Size: Normal)   Pulse 80   Temp 98.6 F (37 C) (Oral)   Ht 5\' 5"  (1.651 m)   Wt 126 lb 9.6 oz (57.4 kg)   LMP 01/01/2018 (Exact Date)   SpO2 100%   BMI 21.07 kg/m   BP/Weight 01/04/2018 10/05/2017 07/05/2017  Systolic BP 114 118 121  Diastolic BP 72 77 75  Wt. (Lbs) 126.6 129.4 -  BMI 21.07 21.53 -  Some encounter information is confidential and restricted. Go to Review Flowsheets activity to see all data.      Physical Exam  Constitutional: She is  oriented to person, place, and time.  Well developed, well nourished, NAD, polite  HENT:  Head: Normocephalic and atraumatic.  Eyes: No scleral icterus.  Neck: Normal range of motion. Neck supple. No thyromegaly present.  Cardiovascular: Normal rate, regular rhythm and normal heart sounds.  Pulmonary/Chest: Effort normal and breath sounds normal.  Abdominal: Soft. Bowel sounds are normal. There is no tenderness.  Musculoskeletal: She exhibits no edema.  Neurological: She is alert and oriented to person, place, and time.  Skin: Skin is warm and dry. No rash noted. No erythema. No pallor.  Psychiatric: She has a normal mood and affect. Her behavior is normal. Thought content normal.  Vitals reviewed.    Assessment & Plan:    1. Encounter for management and injection of depo-Provera - POCT urine pregnancy negative - medroxyPROGESTERone (DEPO-PROVERA) 150 MG/ML injection; Inject 1 mL (150 mg total) into the muscle once for 1 dose.  Dispense: 1 mL; Refill: 0  2. Tobacco use disorder - nicotine (NICODERM CQ - DOSED IN MG/24 HOURS) 21 mg/24hr patch; Place 1 patch (21 mg total) onto the skin daily.  Dispense: 28 patch; Refill: 0 - nicotine ([employer] NICOTINE TRANSDERMAL SYS) 14 mg/24hr patch; Place 1 patch (14 mg total) onto the skin daily.  Dispense: 28 patch; Refill: 0 - nicotine (NICODERM CQ - DOSED IN MG/24 HR) 7 mg/24hr patch; Place 1 patch (7 mg total) onto the skin daily.  Dispense: 28 patch; Refill: 0 - Pt instructed to use in order  from 21 mg to 14 mg and then to 7 mg with each dose being completed before starting the lower dose. Patient communicated understanding.    Meds ordered this encounter  Medications  . nicotine (NICODERM CQ - DOSED IN MG/24 HOURS) 21 mg/24hr patch    Sig: Place 1 patch (21 mg total) onto the skin daily.    Dispense:  28 patch    Refill:  0    Order Specific Question:   Supervising Provider    Answer:   Quentin AngstJEGEDE, OLUGBEMIGA E [id number]  . nicotine ([employer]  NICOTINE TRANSDERMAL SYS) 14 mg/24hr patch    Sig: Place 1 patch (14 mg total) onto the skin daily.    Dispense:  28 patch    Refill:  0    Order Specific Question:   Supervising Provider    Answer:   Quentin AngstJEGEDE, OLUGBEMIGA E [id number]  . nicotine (NICODERM CQ - DOSED IN MG/24 HR) 7 mg/24hr patch    Sig: Place 1 patch (7 mg total) onto the skin daily.    Dispense:  28 patch    Refill:  0    Order Specific Question:   Supervising Provider    Answer:   Quentin AngstJEGEDE, OLUGBEMIGA E [id number]]  . DISCONTD: medroxyPROGESTERone (DEPO-PROVERA) injection 150 mg  . medroxyPROGESTERone (DEPO-PROVERA) 150 MG/ML injection    Sig: Inject 1 mL (150 mg total) into the muscle once for 1 dose.    Dispense:  1 mL    Refill:  0    Order Specific Question:   Supervising Provider    Answer:   Quentin AngstJEGEDE, OLUGBEMIGA E [[id number]]    Follow-up: Return in about 3 months (around 04/05/2018) for Depo provera.   Loletta Specteroger David Mandee Pluta PA

## 2018-01-04 NOTE — Patient Instructions (Signed)

## 2018-01-22 ENCOUNTER — Encounter: Payer: Medicare Other | Admitting: Physical Medicine & Rehabilitation

## 2018-02-12 ENCOUNTER — Encounter: Payer: Medicare Other | Attending: Physical Medicine & Rehabilitation | Admitting: Physical Medicine & Rehabilitation

## 2018-02-12 ENCOUNTER — Encounter: Payer: Self-pay | Admitting: Physical Medicine & Rehabilitation

## 2018-02-12 VITALS — BP 118/77 | HR 81 | Ht 64.0 in | Wt 130.0 lb

## 2018-02-12 DIAGNOSIS — R5383 Other fatigue: Secondary | ICD-10-CM | POA: Diagnosis not present

## 2018-02-12 DIAGNOSIS — F09 Unspecified mental disorder due to known physiological condition: Secondary | ICD-10-CM

## 2018-02-12 DIAGNOSIS — H8110 Benign paroxysmal vertigo, unspecified ear: Secondary | ICD-10-CM

## 2018-02-12 DIAGNOSIS — R29818 Other symptoms and signs involving the nervous system: Secondary | ICD-10-CM | POA: Insufficient documentation

## 2018-02-12 DIAGNOSIS — S0193XD Puncture wound without foreign body of unspecified part of head, subsequent encounter: Secondary | ICD-10-CM | POA: Diagnosis not present

## 2018-02-12 DIAGNOSIS — Z87891 Personal history of nicotine dependence: Secondary | ICD-10-CM | POA: Insufficient documentation

## 2018-02-12 DIAGNOSIS — R51 Headache: Secondary | ICD-10-CM | POA: Diagnosis not present

## 2018-02-12 DIAGNOSIS — S0990XS Unspecified injury of head, sequela: Secondary | ICD-10-CM | POA: Diagnosis not present

## 2018-02-12 DIAGNOSIS — W3400XD Accidental discharge from unspecified firearms or gun, subsequent encounter: Secondary | ICD-10-CM | POA: Diagnosis not present

## 2018-02-12 DIAGNOSIS — F5102 Adjustment insomnia: Secondary | ICD-10-CM | POA: Diagnosis not present

## 2018-02-12 MED ORDER — TRAZODONE HCL 50 MG PO TABS: 25.0000 mg | ORAL_TABLET | Freq: Every day | ORAL | 2 refills | Status: DC
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
## 2018-02-12 NOTE — Patient Instructions (Signed)
YOU NEED TO PRACTICE BETTER SLEEP HYGIENE:  1. GOING TO BED AT A CONSISTENT TIME 2. GOING TO BED AT A REASONABLE TIME (IE BEFORE MIDNIGHT) 3. HAVING A ROOM WHICH IS QUITE AND WITHOUT DISTRACTIONS 4. TRY READING OR PUTTING ON SOME RELAXING MUSICS OR SOUNDS TO HELP FALL ASLEEP

## 2018-02-28 ENCOUNTER — Emergency Department (HOSPITAL_COMMUNITY)
Admission: EM | Admit: 2018-02-28 | Discharge: 2018-03-01 | Disposition: A | Discharge status: Home / Self Care | Payer: Medicare Other | Attending: Emergency Medicine | Admitting: Emergency Medicine

## 2018-02-28 ENCOUNTER — Encounter (HOSPITAL_COMMUNITY): Payer: Self-pay

## 2018-02-28 DIAGNOSIS — N3001 Acute cystitis with hematuria: Secondary | ICD-10-CM | POA: Diagnosis not present

## 2018-02-28 DIAGNOSIS — R112 Nausea with vomiting, unspecified: Secondary | ICD-10-CM

## 2018-02-28 DIAGNOSIS — Z87891 Personal history of nicotine dependence: Secondary | ICD-10-CM | POA: Diagnosis not present

## 2018-02-28 DIAGNOSIS — Z79899 Other long term (current) drug therapy: Secondary | ICD-10-CM | POA: Diagnosis not present

## 2018-02-28 DIAGNOSIS — R51 Headache: Secondary | ICD-10-CM | POA: Diagnosis not present

## 2018-02-28 LAB — URINALYSIS, ROUTINE W REFLEX MICROSCOPIC
BILIRUBIN URINE: NEGATIVE
GLUCOSE, UA: NEGATIVE mg/dL
KETONES UR: NEGATIVE mg/dL
Nitrite: POSITIVE — AB
Protein, ur: 30 mg/dL — AB
Specific Gravity, Urine: 1.027 (ref 1.005–1.030)
pH: 5 (ref 5.0–8.0)

## 2018-02-28 LAB — CBC WITH DIFFERENTIAL/PLATELET
ABS IMMATURE GRANULOCYTES: 0 10*3/uL (ref 0.0–0.1)
BASOS ABS: 0 10*3/uL (ref 0.0–0.1)
Basophils Relative: 0 %
Eosinophils Absolute: 0.2 10*3/uL (ref 0.0–0.7)
Eosinophils Relative: 2 %
HCT: 38 % (ref 36.0–46.0)
HEMOGLOBIN: 12.4 g/dL (ref 12.0–15.0)
Immature Granulocytes: 0 %
Lymphocytes Relative: 16 %
Lymphs Abs: 1.1 10*3/uL (ref 0.7–4.0)
MCH: 31.3 pg (ref 26.0–34.0)
MCHC: 32.6 g/dL (ref 30.0–36.0)
MCV: 96 fL (ref 78.0–100.0)
MONO ABS: 0.5 10*3/uL (ref 0.1–1.0)
Monocytes Relative: 7 %
NEUTROS ABS: 5.2 10*3/uL (ref 1.7–7.7)
Neutrophils Relative %: 75 %
Platelets: 267 10*3/uL (ref 150–400)
RBC: 3.96 MIL/uL (ref 3.87–5.11)
RDW: 13.9 % (ref 11.5–15.5)
WBC: 7.1 10*3/uL (ref 4.0–10.5)

## 2018-02-28 LAB — COMPREHENSIVE METABOLIC PANEL
ALT: 18 U/L (ref 14–54)
AST: 16 U/L (ref 15–41)
Albumin: 4 g/dL (ref 3.5–5.0)
Alkaline Phosphatase: 40 U/L (ref 38–126)
Anion gap: 7 (ref 5–15)
BUN: 17 mg/dL (ref 6–20)
CO2: 25 mmol/L (ref 22–32)
Calcium: 8.9 mg/dL (ref 8.9–10.3)
Chloride: 109 mmol/L (ref 101–111)
Creatinine, Ser: 0.62 mg/dL (ref 0.44–1.00)
GFR calc Af Amer: >60 mL/min (ref >60)
GFR calc non Af Amer: >60 mL/min (ref >60)
GLUCOSE: 98 mg/dL (ref 65–99)
POTASSIUM: 3.9 mmol/L (ref 3.5–5.1)
Sodium: 141 mmol/L (ref 135–145)
Total Bilirubin: 0.7 mg/dL (ref 0.3–1.2)
Total Protein: 6.9 g/dL (ref 6.5–8.1)

## 2018-02-28 LAB — I-STAT BETA HCG BLOOD, ED (MC, WL, AP ONLY): I-stat hCG, quantitative: <5 m[IU]/mL (ref <5)

## 2018-02-28 LAB — LIPASE, BLOOD: LIPASE: 31 U/L (ref 11–51)
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
## 2018-03-01 ENCOUNTER — Other Ambulatory Visit: Payer: Self-pay

## 2018-03-01 DIAGNOSIS — N3001 Acute cystitis with hematuria: Secondary | ICD-10-CM | POA: Diagnosis not present

## 2018-03-01 MED ORDER — SODIUM CHLORIDE 0.9 % IV SOLN
1.0000 g | Freq: Once | INTRAVENOUS | Status: AC
  Administered 2018-03-01: 1 g via INTRAVENOUS
  Filled 2018-03-01: qty 10

## 2018-03-01 MED ORDER — SODIUM CHLORIDE 0.9 % IV BOLUS
1000.0000 mL | Freq: Once | INTRAVENOUS | Status: AC
  Administered 2018-03-01: 1000 mL via INTRAVENOUS

## 2018-03-01 MED ORDER — ONDANSETRON HCL 4 MG/2ML IJ SOLN
4.0000 mg | Freq: Once | INTRAMUSCULAR | Status: AC
  Administered 2018-03-01: 4 mg via INTRAVENOUS
  Filled 2018-03-01: qty 2

## 2018-03-01 MED ORDER — ONDANSETRON 4 MG PO TBDP: 4.0000 mg | ORAL_TABLET | Freq: Three times a day (TID) | ORAL | 0 refills | Status: DC | PRN

## 2018-03-01 MED ORDER — CEPHALEXIN 500 MG PO CAPS: 500.0000 mg | ORAL_CAPSULE | Freq: Three times a day (TID) | ORAL | 0 refills | Status: DC
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
## 2018-03-01 NOTE — Discharge Instructions (Addendum)
Take the prescribed medication as directed.  Make sure to drink lots of fluids to stay hydrated. Follow-up with your primary care doctor. Return to the ED for new or worsening symptoms. 

## 2018-03-20 ENCOUNTER — Telehealth: Payer: Self-pay | Admitting: Physical Medicine & Rehabilitation
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
## 2018-03-26 ENCOUNTER — Telehealth: Payer: Self-pay | Admitting: Physical Medicine & Rehabilitation

## 2018-03-26 ENCOUNTER — Encounter: Payer: Self-pay | Admitting: Physical Medicine & Rehabilitation

## 2018-03-26 NOTE — Telephone Encounter (Signed)
Patients Mom called back and needs letter urgently for her daughter - just as it was written last year  --Original request was 06.25.19 by Sigmund HazelLisa Miller

## 2018-04-02 NOTE — Telephone Encounter (Signed)
Letter written

## 2018-04-03 NOTE — Telephone Encounter (Signed)
Mother notified and she will call me with fax number to send the letter to before 1:30 today.

## 2018-04-03 NOTE — Telephone Encounter (Signed)
Letter faxed with confirmation to [phone] @ 12:20 pm

## 2018-04-05 ENCOUNTER — Other Ambulatory Visit: Payer: Self-pay

## 2018-04-05 ENCOUNTER — Ambulatory Visit (INDEPENDENT_AMBULATORY_CARE_PROVIDER_SITE_OTHER): Payer: Medicare Other

## 2018-04-05 DIAGNOSIS — Z3042 Encounter for surveillance of injectable contraceptive: Secondary | ICD-10-CM

## 2018-04-05 MED ORDER — MEDROXYPROGESTERONE ACETATE 150 MG/ML IM SUSP
150.0000 mg | Freq: Once | INTRAMUSCULAR | Status: AC
  Administered 2018-04-05: 150 mg via INTRAMUSCULAR

## 2018-04-10 ENCOUNTER — Telehealth: Payer: Self-pay | Admitting: *Deleted
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
## 2018-04-12 NOTE — Telephone Encounter (Signed)
Mother called to speak with Gillis EndsSybil about this matter.  I did explain to her that the message was sent to Dr. Riley KillSwartz and as soon as he responds to it, she would call her back.  She stated if she doesn't hear anything back by tomorrow, she would need to speak to [facility] about this.

## 2018-04-13 NOTE — Telephone Encounter (Signed)
As requested, I wrote the  letter just like the last one.  I wrote another letter with details she is now requesting. Should we be charging for this letter????

## 2018-04-13 NOTE — Telephone Encounter (Signed)
Faxed to number provided with the original request and her mother Kennis CarinaYulita was notified.

## 2018-05-16 ENCOUNTER — Encounter: Payer: Medicare Other | Attending: Physical Medicine & Rehabilitation | Admitting: Physical Medicine & Rehabilitation

## 2018-05-16 ENCOUNTER — Other Ambulatory Visit: Payer: Self-pay

## 2018-05-16 ENCOUNTER — Encounter: Payer: Self-pay | Admitting: Physical Medicine & Rehabilitation

## 2018-05-16 VITALS — BP 121/81 | HR 68 | Ht 64.0 in | Wt 131.2 lb

## 2018-05-16 DIAGNOSIS — R5383 Other fatigue: Secondary | ICD-10-CM | POA: Diagnosis not present

## 2018-05-16 DIAGNOSIS — F09 Unspecified mental disorder due to known physiological condition: Secondary | ICD-10-CM | POA: Diagnosis not present

## 2018-05-16 DIAGNOSIS — F5102 Adjustment insomnia: Secondary | ICD-10-CM | POA: Diagnosis not present

## 2018-05-16 DIAGNOSIS — S0193XD Puncture wound without foreign body of unspecified part of head, subsequent encounter: Secondary | ICD-10-CM | POA: Insufficient documentation

## 2018-05-16 DIAGNOSIS — R51 Headache: Secondary | ICD-10-CM | POA: Insufficient documentation

## 2018-05-16 DIAGNOSIS — R29818 Other symptoms and signs involving the nervous system: Secondary | ICD-10-CM | POA: Insufficient documentation

## 2018-05-16 DIAGNOSIS — S06306S Unspecified focal traumatic brain injury with loss of consciousness greater than 24 hours without return to pre-existing conscious level with patient surviving, sequela: Secondary | ICD-10-CM | POA: Diagnosis not present

## 2018-05-16 DIAGNOSIS — S0990XS Unspecified injury of head, sequela: Secondary | ICD-10-CM | POA: Diagnosis not present

## 2018-05-16 DIAGNOSIS — Z87891 Personal history of nicotine dependence: Secondary | ICD-10-CM | POA: Diagnosis not present

## 2018-05-16 NOTE — Patient Instructions (Signed)
PLEASE FEEL FREE TO CALL OUR OFFICE WITH ANY PROBLEMS OR QUESTIONS ([phone])      

## 2018-05-16 NOTE — Progress Notes (Signed)
  Subjective:     Patient ID: Leah BlitzShanese Rojas,
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
## 2018-07-02 ENCOUNTER — Other Ambulatory Visit: Payer: Self-pay

## 2018-07-02 ENCOUNTER — Encounter (INDEPENDENT_AMBULATORY_CARE_PROVIDER_SITE_OTHER): Payer: Self-pay | Admitting: Physician Assistant

## 2018-07-02 ENCOUNTER — Ambulatory Visit (INDEPENDENT_AMBULATORY_CARE_PROVIDER_SITE_OTHER): Payer: Medicare Other | Admitting: Physician Assistant

## 2018-07-02 VITALS — BP 119/78 | HR 108 | Temp 98.7°F | Ht 64.0 in | Wt 130.4 lb

## 2018-07-02 DIAGNOSIS — Z3042 Encounter for surveillance of injectable contraceptive: Secondary | ICD-10-CM | POA: Diagnosis not present

## 2018-07-02 MED ORDER — MEDROXYPROGESTERONE ACETATE 150 MG/ML IM SUSP
150.0000 mg | Freq: Once | INTRAMUSCULAR | Status: AC
  Administered 2018-07-02: 150 mg via INTRAMUSCULAR

## 2018-07-02 NOTE — Progress Notes (Signed)
   Subjective:  Patient ID: Leah Rojas, female    DOB: 03-30-1991  Age: 27 y.o. MRN: [id number]  CC:   HPI Leah Rojas a 27 y.o.female, G2P1with a medical history of bronchitis and traumatic brain injury presents for fourth depo-provera shot. Denies swelling, bruising, rash, SOB, change in mood, f/c/n/v, abdominal pain, chest pain, HA, or breast tenderness. Stopped smoking cigarettes.     Outpatient Medications Prior to Visit  Medication Sig Dispense Refill  . propranolol (INDERAL) 20 MG tablet TAKE 1 TABLET BY MOUTH 3 TIMES A DAY 90 tablet 3  . sertraline (ZOLOFT) 25 MG tablet TAKE 1 TABLET BY MOUTH EVERY DAY 30 tablet 2  . traZODone (DESYREL) 50 MG tablet Take 0.5-1 tablets (25-50 mg total) by mouth at bedtime. 30 tablet 2  . medroxyPROGESTERone (DEPO-PROVERA) 150 MG/ML injection Inject 1 mL (150 mg total) into the muscle once for 1 dose. 1 mL 0  . ondansetron (ZOFRAN ODT) 4 MG disintegrating tablet Take 1 tablet (4 mg total) by mouth every 8 (eight) hours as needed for nausea. 10 tablet 0   No facility-administered medications prior to visit.      ROS Review of Systems  Constitutional: Negative for chills, fever and malaise/fatigue.  Eyes: Negative for blurred vision.  Respiratory: Negative for shortness of breath.   Cardiovascular: Negative for chest pain and palpitations.  Gastrointestinal: Negative for abdominal pain and nausea.  Genitourinary: Negative for dysuria and hematuria.  Musculoskeletal: Negative for joint pain and myalgias.  Skin: Negative for rash.  Neurological: Negative for tingling and headaches.  Psychiatric/Behavioral: Negative for depression. The patient is not nervous/anxious.     Objective:  BP 119/78 (BP Location: Right Arm, Patient Position: Sitting, Cuff Size: Normal)   Pulse (!) 108   Temp 98.7 F (37.1 C) (Oral)   Ht 5\' 4"  (1.626 m)   Wt 130 lb 6.4 oz (59.1 kg)   LMP 06/26/2018 (Exact Date)   SpO2 94%   BMI 22.38 kg/m   BP/Weight  07/02/2018 05/16/2018 03/01/2018  Systolic BP 119 121 106  Diastolic BP 78 81 57  Wt. (Lbs) 130.4 131.2 130  BMI 22.38 22.52 22.31  Some encounter information is confidential and restricted. Go to Review Flowsheets activity to see all data.      Physical Exam  Constitutional: She is oriented to person, place, and time.  Well developed, well nourished, NAD, polite  HENT:  Head: Normocephalic and atraumatic.  Eyes: No scleral icterus.  Neck: Normal range of motion. Neck supple. No thyromegaly present.  Cardiovascular: Normal rate, regular rhythm and normal heart sounds.  Pulmonary/Chest: Effort normal and breath sounds normal.  Abdominal: Soft. Bowel sounds are normal. There is no tenderness.  Musculoskeletal: She exhibits no edema.  Neurological: She is alert and oriented to person, place, and time.  Skin: Skin is warm and dry. No rash noted. No erythema. No pallor.  Psychiatric: She has a normal mood and affect. Her behavior is normal. Thought content normal.  Vitals reviewed.    Assessment & Plan:   1. Encounter for management and injection of depo-Provera - Administered medroxyPROGESTERone (DEPO-PROVERA) injection 150 mg   Meds ordered this encounter  Medications  . medroxyPROGESTERone (DEPO-PROVERA) injection 150 mg    Follow-up: Return in about 12 weeks (around 09/24/2018).   Loletta Specter PA

## 2018-07-02 NOTE — Patient Instructions (Signed)

## 2018-07-07 ENCOUNTER — Other Ambulatory Visit: Payer: Self-pay | Admitting: Physical Medicine & Rehabilitation

## 2018-07-07 DIAGNOSIS — S06309D Unspecified focal traumatic brain injury with loss of consciousness of unspecified duration, subsequent encounter: Secondary | ICD-10-CM

## 2018-07-07 DIAGNOSIS — H8113 Benign paroxysmal vertigo, bilateral: Secondary | ICD-10-CM

## 2018-07-07 DIAGNOSIS — S0291XK Unspecified fracture of skull, subsequent encounter for fracture with nonunion: Secondary | ICD-10-CM

## 2018-07-07 DIAGNOSIS — S0990XS Unspecified injury of head, sequela: Secondary | ICD-10-CM

## 2018-07-07 DIAGNOSIS — F09 Unspecified mental disorder due to known physiological condition: Secondary | ICD-10-CM

## 2018-09-06 ENCOUNTER — Encounter (HOSPITAL_COMMUNITY): Payer: Self-pay | Admitting: Emergency Medicine

## 2018-09-06 ENCOUNTER — Other Ambulatory Visit: Payer: Self-pay

## 2018-09-06 ENCOUNTER — Ambulatory Visit (HOSPITAL_COMMUNITY)
Admission: EM | Admit: 2018-09-06 | Discharge: 2018-09-06 | Disposition: A | Discharge status: Home / Self Care | Payer: Medicare Other | Attending: Family Medicine | Admitting: Family Medicine

## 2018-09-06 DIAGNOSIS — B356 Tinea cruris: Secondary | ICD-10-CM | POA: Diagnosis not present

## 2018-09-06 MED ORDER — TRIAMCINOLONE ACETONIDE 0.1 % EX CREA: 1.0000 "application " | TOPICAL_CREAM | Freq: Two times a day (BID) | CUTANEOUS | 0 refills | Status: DC

## 2018-09-06 MED ORDER — CLOTRIMAZOLE-BETAMETHASONE 1-0.05 % EX CREA: TOPICAL_CREAM | CUTANEOUS | 0 refills | Status: DC

## 2018-09-06 MED ORDER — KETOCONAZOLE 2 % EX CREA: 1.0000 "application " | TOPICAL_CREAM | Freq: Two times a day (BID) | CUTANEOUS | 0 refills | Status: DC
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
## 2018-09-06 NOTE — ED Triage Notes (Signed)
PT has itching around rectal area. PT reports skin is broken from scratching.

## 2018-09-06 NOTE — Discharge Instructions (Signed)
Mild fungal infection which should clear in the next week by using the cream.

## 2018-09-24 ENCOUNTER — Other Ambulatory Visit: Payer: Self-pay | Admitting: Physical Medicine & Rehabilitation

## 2018-09-24 DIAGNOSIS — H8113 Benign paroxysmal vertigo, bilateral: Secondary | ICD-10-CM

## 2018-09-24 DIAGNOSIS — F09 Unspecified mental disorder due to known physiological condition: Secondary | ICD-10-CM

## 2018-09-24 DIAGNOSIS — S0291XK Unspecified fracture of skull, subsequent encounter for fracture with nonunion: Secondary | ICD-10-CM

## 2018-09-24 DIAGNOSIS — S0990XS Unspecified injury of head, sequela: Secondary | ICD-10-CM

## 2018-09-24 DIAGNOSIS — S06309D Unspecified focal traumatic brain injury with loss of consciousness of unspecified duration, subsequent encounter: Secondary | ICD-10-CM

## 2018-10-01 ENCOUNTER — Encounter (HOSPITAL_COMMUNITY): Payer: Self-pay | Admitting: Emergency Medicine

## 2018-10-01 ENCOUNTER — Ambulatory Visit (HOSPITAL_COMMUNITY)
Admission: EM | Admit: 2018-10-01 | Discharge: 2018-10-01 | Disposition: A | Discharge status: Home / Self Care | Payer: Medicare Other | Attending: Internal Medicine | Admitting: Internal Medicine

## 2018-10-01 ENCOUNTER — Other Ambulatory Visit: Payer: Self-pay

## 2018-10-01 DIAGNOSIS — Z113 Encounter for screening for infections with a predominantly sexual mode of transmission: Secondary | ICD-10-CM | POA: Diagnosis not present

## 2018-10-01 DIAGNOSIS — N898 Other specified noninflammatory disorders of vagina: Secondary | ICD-10-CM | POA: Insufficient documentation

## 2018-10-01 DIAGNOSIS — Z3202 Encounter for pregnancy test, result negative: Secondary | ICD-10-CM

## 2018-10-01 DIAGNOSIS — Z202 Contact with and (suspected) exposure to infections with a predominantly sexual mode of transmission: Secondary | ICD-10-CM

## 2018-10-01 LAB — POCT URINALYSIS DIP (DEVICE)
Bilirubin Urine: NEGATIVE
Glucose, UA: NEGATIVE mg/dL
KETONES UR: NEGATIVE mg/dL
Nitrite: NEGATIVE
Protein, ur: NEGATIVE mg/dL
Specific Gravity, Urine: 1.025 (ref 1.005–1.030)
Urobilinogen, UA: 1 mg/dL (ref 0.0–1.0)
pH: 6.5 (ref 5.0–8.0)

## 2018-10-01 LAB — POCT PREGNANCY, URINE: Preg Test, Ur: NEGATIVE

## 2018-10-01 MED ORDER — CEFTRIAXONE SODIUM 250 MG IJ SOLR
INTRAMUSCULAR | Status: AC
  Filled 2018-10-01: qty 250

## 2018-10-01 MED ORDER — AZITHROMYCIN 250 MG PO TABS
1000.0000 mg | ORAL_TABLET | Freq: Once | ORAL | Status: AC
  Administered 2018-10-01: 1000 mg via ORAL

## 2018-10-01 MED ORDER — METRONIDAZOLE 500 MG PO TABS: 500.0000 mg | ORAL_TABLET | Freq: Two times a day (BID) | ORAL | 0 refills | Status: AC

## 2018-10-01 MED ORDER — AZITHROMYCIN 250 MG PO TABS
ORAL_TABLET | ORAL | Status: AC
  Filled 2018-10-01: qty 4

## 2018-10-01 MED ORDER — CEFTRIAXONE SODIUM 250 MG IJ SOLR
250.0000 mg | Freq: Once | INTRAMUSCULAR | Status: AC
  Administered 2018-10-01: 250 mg via INTRAMUSCULAR

## 2018-10-01 NOTE — ED Triage Notes (Signed)
Patient has noticed an odor and vaginal discharge.  Denies pain with urination.  Onset of symptoms one week ago

## 2018-10-01 NOTE — Discharge Instructions (Signed)
We have treated you today for gonorrhea and chlamydia, with Rocephin and azithromycin. Please refrain from sexual intercourse for 7 days while medicines eliminating infection.  Please begin taking metronidazole twice daily for the next week to cover for bacterial vaginosis as well as Trichomonas.  We will send your urine off for culture, we will call you if this shows signs concerning for UTI.  We are testing you for Gonorrhea, Chlamydia, Trichomonas, Yeast and Bacterial Vaginosis. We will call you if anything is positive and let you know if you require any further treatment. Please inform partners of any positive results.   Please return if symptoms not improving with treatment, development of fever, nausea, vomiting, abdominal pain.

## 2018-10-02 ENCOUNTER — Emergency Department (HOSPITAL_COMMUNITY)
Admission: EM | Admit: 2018-10-02 | Discharge: 2018-10-02 | Disposition: A | Discharge status: Home / Self Care | Payer: Medicare Other | Attending: Emergency Medicine | Admitting: Emergency Medicine

## 2018-10-02 ENCOUNTER — Encounter: Payer: Self-pay | Admitting: Emergency Medicine

## 2018-10-02 DIAGNOSIS — Z87820 Personal history of traumatic brain injury: Secondary | ICD-10-CM | POA: Insufficient documentation

## 2018-10-02 DIAGNOSIS — Z87891 Personal history of nicotine dependence: Secondary | ICD-10-CM | POA: Insufficient documentation

## 2018-10-02 DIAGNOSIS — R112 Nausea with vomiting, unspecified: Secondary | ICD-10-CM

## 2018-10-02 DIAGNOSIS — Z79899 Other long term (current) drug therapy: Secondary | ICD-10-CM | POA: Insufficient documentation

## 2018-10-02 DIAGNOSIS — N390 Urinary tract infection, site not specified: Secondary | ICD-10-CM | POA: Insufficient documentation

## 2018-10-02 LAB — COMPREHENSIVE METABOLIC PANEL
ALT: 16 U/L (ref 0–44)
AST: 14 U/L — ABNORMAL LOW (ref 15–41)
Albumin: 3.7 g/dL (ref 3.5–5.0)
Alkaline Phosphatase: 39 U/L (ref 38–126)
Anion gap: 7 (ref 5–15)
BUN: 17 mg/dL (ref 6–20)
CALCIUM: 8.9 mg/dL (ref 8.9–10.3)
CO2: 22 mmol/L (ref 22–32)
Chloride: 111 mmol/L (ref 98–111)
Creatinine, Ser: 0.64 mg/dL (ref 0.44–1.00)
GFR calc Af Amer: >60 mL/min (ref >60)
GFR calc non Af Amer: >60 mL/min (ref >60)
Glucose, Bld: 94 mg/dL (ref 70–99)
Potassium: 4 mmol/L (ref 3.5–5.1)
Sodium: 140 mmol/L (ref 135–145)
Total Bilirubin: 0.7 mg/dL (ref 0.3–1.2)
Total Protein: 6.8 g/dL (ref 6.5–8.1)

## 2018-10-02 LAB — CBC WITH DIFFERENTIAL/PLATELET
Abs Immature Granulocytes: 0.01 10*3/uL (ref 0.00–0.07)
Basophils Absolute: 0 10*3/uL (ref 0.0–0.1)
Basophils Relative: 1 %
EOS PCT: 2 %
Eosinophils Absolute: 0.1 10*3/uL (ref 0.0–0.5)
HCT: 36 % (ref 36.0–46.0)
Hemoglobin: 11.4 g/dL — ABNORMAL LOW (ref 12.0–15.0)
Immature Granulocytes: 0 %
Lymphocytes Relative: 29 %
Lymphs Abs: 2.1 10*3/uL (ref 0.7–4.0)
MCH: 30.9 pg (ref 26.0–34.0)
MCHC: 31.7 g/dL (ref 30.0–36.0)
MCV: 97.6 fL (ref 80.0–100.0)
Monocytes Absolute: 0.4 10*3/uL (ref 0.1–1.0)
Monocytes Relative: 5 %
Neutro Abs: 4.6 10*3/uL (ref 1.7–7.7)
Neutrophils Relative %: 63 %
Platelets: 291 10*3/uL (ref 150–400)
RBC: 3.69 MIL/uL — ABNORMAL LOW (ref 3.87–5.11)
RDW: 13.6 % (ref 11.5–15.5)
WBC: 7.3 10*3/uL (ref 4.0–10.5)
nRBC: 0 % (ref 0.0–0.2)

## 2018-10-02 LAB — URINALYSIS, ROUTINE W REFLEX MICROSCOPIC
Bilirubin Urine: NEGATIVE
Glucose, UA: NEGATIVE mg/dL
Hgb urine dipstick: NEGATIVE
Ketones, ur: NEGATIVE mg/dL
Nitrite: NEGATIVE
Protein, ur: NEGATIVE mg/dL
Specific Gravity, Urine: 1.023 (ref 1.005–1.030)
pH: 6 (ref 5.0–8.0)

## 2018-10-02 LAB — CERVICOVAGINAL ANCILLARY ONLY
Bacterial vaginitis: POSITIVE — AB
CANDIDA VAGINITIS: NEGATIVE
Chlamydia: POSITIVE — AB
NEISSERIA GONORRHEA: NEGATIVE
Trichomonas: NEGATIVE

## 2018-10-02 LAB — I-STAT BETA HCG BLOOD, ED (MC, WL, AP ONLY)

## 2018-10-02 LAB — LIPASE, BLOOD: Lipase: 31 U/L (ref 11–51)

## 2018-10-02 MED ORDER — FAMOTIDINE IN NACL 20-0.9 MG/50ML-% IV SOLN
20.0000 mg | Freq: Once | INTRAVENOUS | Status: AC
  Administered 2018-10-02: 20 mg via INTRAVENOUS
  Filled 2018-10-02: qty 50

## 2018-10-02 MED ORDER — ONDANSETRON HCL 4 MG/2ML IJ SOLN
4.0000 mg | Freq: Once | INTRAMUSCULAR | Status: AC
  Administered 2018-10-02: 4 mg via INTRAVENOUS
  Filled 2018-10-02: qty 2

## 2018-10-02 MED ORDER — FAMOTIDINE 20 MG PO TABS: 20.0000 mg | ORAL_TABLET | Freq: Two times a day (BID) | ORAL | 0 refills | Status: DC

## 2018-10-02 MED ORDER — ONDANSETRON 4 MG PO TBDP: ORAL_TABLET | ORAL | 0 refills | Status: DC

## 2018-10-02 MED ORDER — CEPHALEXIN 500 MG PO CAPS: 500.0000 mg | ORAL_CAPSULE | Freq: Three times a day (TID) | ORAL | 0 refills | Status: AC

## 2018-10-02 MED ORDER — SODIUM CHLORIDE 0.9 % IV BOLUS
1000.0000 mL | Freq: Once | INTRAVENOUS | Status: AC
  Administered 2018-10-02: 1000 mL via INTRAVENOUS

## 2018-10-02 NOTE — ED Notes (Signed)
Pt given gingerale for fluid challenge. Pt tolerating well. Will continue to monitor.   

## 2018-10-02 NOTE — ED Triage Notes (Signed)
Pt arrives by pov with vaginal discharge and emesis. Pt reports having pelvic exam yesterday and treated for STDs and also received prescriptions but has not been able to fill those yet.

## 2018-10-02 NOTE — ED Notes (Signed)
Urine culture tube sent down with urine sample.   

## 2018-10-02 NOTE — ED Notes (Signed)
Pt verbalizes understanding of d/c instructions. Prescriptions reviewed with patient. Pt ambulatory at d/c with all belongings.  
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
## 2018-10-02 NOTE — Discharge Instructions (Signed)
Your evaluation today is very reassuring, labs overall look good.  Urine does show some slight signs of infection and given your history of severe urinary tract infections with minimal symptoms we will go ahead and treat with Keflex, please take this 3 times daily.  I would also like for you to take the antibiotic that you were prescribed at urgent care yesterday to treat for BV.  You may use Zofran as needed for nausea and I would like for you to take Pepcid twice daily to help with any stomach irritation.  Make sure you are eating small frequent meals.  Follow-up with your regular doctor.  Return to the emergency department if you have persistent vomiting, fevers, abdominal pain or any other new or concerning symptoms.

## 2018-10-03 ENCOUNTER — Telehealth (HOSPITAL_COMMUNITY): Payer: Self-pay | Admitting: Emergency Medicine

## 2018-10-03 LAB — URINE CULTURE: Culture: NO GROWTH

## 2018-10-03 NOTE — Telephone Encounter (Signed)
Bacterial Vaginosis test is positive.  Prescription for metronidazole was given at the urgent care visit. Pt contacted regarding results. Answered all questions. Verbalized understanding.  Chlamydia is positive.  This was treated at the urgent care visit with po zithromax 1g.  Pt needs education to please refrain from sexual intercourse for 7 days to give the medicine time to work.  Sexual partners need to be notified and tested/treated.  Condoms may reduce risk of reinfection.  Recheck or followup with PCP for further evaluation if symptoms are not improving.  [facility] notified.  Patient contacted and made aware, all questions answered.  Pending urine culture.

## 2018-10-04 LAB — URINE CULTURE: Culture: >=100000 — AB

## 2018-10-07 ENCOUNTER — Telehealth (HOSPITAL_COMMUNITY): Payer: Self-pay | Admitting: Emergency Medicine

## 2018-10-07 NOTE — Telephone Encounter (Signed)
Urine culture was positive for CITROBACTER KOSERI and was given kefelx  at urgent care visit. Pt contacted and made aware, educated on completing antibiotic and to follow up if symptoms are persistent. Verbalized understanding.

## 2018-10-15 ENCOUNTER — Ambulatory Visit (HOSPITAL_COMMUNITY)
Admission: EM | Admit: 2018-10-15 | Discharge: 2018-10-15 | Disposition: A | Discharge status: Home / Self Care | Payer: Medicare Other | Attending: Family Medicine | Admitting: Family Medicine

## 2018-10-15 ENCOUNTER — Encounter (HOSPITAL_COMMUNITY): Payer: Self-pay

## 2018-10-15 ENCOUNTER — Other Ambulatory Visit: Payer: Self-pay

## 2018-10-15 DIAGNOSIS — N760 Acute vaginitis: Secondary | ICD-10-CM

## 2018-10-15 LAB — POCT URINALYSIS DIP (DEVICE)
Bilirubin Urine: NEGATIVE
Glucose, UA: NEGATIVE mg/dL
Nitrite: NEGATIVE
Protein, ur: NEGATIVE mg/dL
Specific Gravity, Urine: >=1.03 (ref 1.005–1.030)
Urobilinogen, UA: 0.2 mg/dL (ref 0.0–1.0)
pH: 5.5 (ref 5.0–8.0)

## 2018-10-15 LAB — POCT PREGNANCY, URINE: Preg Test, Ur: NEGATIVE

## 2018-10-15 MED ORDER — CLOTRIMAZOLE 1 % EX CREA: TOPICAL_CREAM | CUTANEOUS | 0 refills | Status: DC

## 2018-10-15 MED ORDER — FLUCONAZOLE 150 MG PO TABS: 150.0000 mg | ORAL_TABLET | Freq: Once | ORAL | 0 refills | Status: AC

## 2018-10-15 NOTE — Discharge Instructions (Signed)
Please take diflucan today to treat for yeast May apply clotrimazole twice daily  We will call with results of swab

## 2018-10-15 NOTE — ED Triage Notes (Signed)
Pt states she has a vaginal itch x 3 days.

## 2018-10-16 LAB — CERVICOVAGINAL ANCILLARY ONLY
Bacterial vaginitis: NEGATIVE
Candida vaginitis: POSITIVE — AB
Chlamydia: NEGATIVE
Neisseria Gonorrhea: NEGATIVE
Trichomonas: NEGATIVE
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
## 2018-10-17 LAB — URINE CULTURE

## 2018-10-26 ENCOUNTER — Other Ambulatory Visit: Payer: Self-pay | Admitting: Physical Medicine & Rehabilitation

## 2018-10-26 DIAGNOSIS — F09 Unspecified mental disorder due to known physiological condition: Secondary | ICD-10-CM

## 2018-10-26 DIAGNOSIS — S06309D Unspecified focal traumatic brain injury with loss of consciousness of unspecified duration, subsequent encounter: Secondary | ICD-10-CM

## 2018-10-26 DIAGNOSIS — S0291XK Unspecified fracture of skull, subsequent encounter for fracture with nonunion: Secondary | ICD-10-CM

## 2018-10-26 DIAGNOSIS — H8113 Benign paroxysmal vertigo, bilateral: Secondary | ICD-10-CM

## 2018-10-26 DIAGNOSIS — S0990XS Unspecified injury of head, sequela: Secondary | ICD-10-CM

## 2018-11-12 ENCOUNTER — Encounter: Payer: Self-pay | Admitting: Physical Medicine & Rehabilitation

## 2018-11-12 ENCOUNTER — Encounter: Payer: Medicare Other | Attending: Physical Medicine & Rehabilitation | Admitting: Physical Medicine & Rehabilitation

## 2018-11-12 VITALS — BP 125/80 | HR 83 | Resp 14 | Wt 139.0 lb

## 2018-11-12 DIAGNOSIS — R51 Headache: Secondary | ICD-10-CM | POA: Insufficient documentation

## 2018-11-12 DIAGNOSIS — G4700 Insomnia, unspecified: Secondary | ICD-10-CM | POA: Diagnosis not present

## 2018-11-12 DIAGNOSIS — R531 Weakness: Secondary | ICD-10-CM | POA: Diagnosis not present

## 2018-11-12 DIAGNOSIS — R5383 Other fatigue: Secondary | ICD-10-CM | POA: Diagnosis not present

## 2018-11-12 DIAGNOSIS — S0990XS Unspecified injury of head, sequela: Secondary | ICD-10-CM | POA: Diagnosis not present

## 2018-11-12 DIAGNOSIS — F09 Unspecified mental disorder due to known physiological condition: Secondary | ICD-10-CM | POA: Diagnosis not present

## 2018-11-12 DIAGNOSIS — F5102 Adjustment insomnia: Secondary | ICD-10-CM | POA: Insufficient documentation

## 2018-11-12 DIAGNOSIS — Z79899 Other long term (current) drug therapy: Secondary | ICD-10-CM | POA: Insufficient documentation

## 2018-11-12 DIAGNOSIS — Z87891 Personal history of nicotine dependence: Secondary | ICD-10-CM | POA: Insufficient documentation

## 2018-11-12 NOTE — Patient Instructions (Addendum)
PLEASE FEEL FREE TO CALL OUR OFFICE WITH ANY PROBLEMS OR QUESTIONS [phone])     STIMULANT TRIAL!!!!
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
## 2018-12-04 ENCOUNTER — Encounter (HOSPITAL_COMMUNITY): Payer: Self-pay | Admitting: Emergency Medicine

## 2018-12-04 ENCOUNTER — Other Ambulatory Visit: Payer: Self-pay

## 2018-12-04 ENCOUNTER — Emergency Department (HOSPITAL_COMMUNITY)
Admission: EM | Admit: 2018-12-04 | Discharge: 2018-12-04 | Disposition: A | Discharge status: Home / Self Care | Payer: Medicare Other | Attending: Emergency Medicine | Admitting: Emergency Medicine

## 2018-12-04 DIAGNOSIS — Z87891 Personal history of nicotine dependence: Secondary | ICD-10-CM | POA: Diagnosis not present

## 2018-12-04 DIAGNOSIS — Z79899 Other long term (current) drug therapy: Secondary | ICD-10-CM | POA: Insufficient documentation

## 2018-12-04 DIAGNOSIS — N760 Acute vaginitis: Secondary | ICD-10-CM | POA: Diagnosis not present

## 2018-12-04 DIAGNOSIS — A084 Viral intestinal infection, unspecified: Secondary | ICD-10-CM | POA: Diagnosis not present

## 2018-12-04 DIAGNOSIS — R112 Nausea with vomiting, unspecified: Secondary | ICD-10-CM | POA: Diagnosis present

## 2018-12-04 LAB — POC URINE PREG, ED: Preg Test, Ur: NEGATIVE

## 2018-12-04 MED ORDER — FLUCONAZOLE 150 MG PO TABS
150.0000 mg | ORAL_TABLET | Freq: Once | ORAL | Status: AC
  Administered 2018-12-04: 150 mg via ORAL
  Filled 2018-12-04: qty 1

## 2018-12-04 MED ORDER — CLOTRIMAZOLE 1 % EX CREA: TOPICAL_CREAM | CUTANEOUS | 0 refills | Status: DC

## 2018-12-04 MED ORDER — ONDANSETRON 4 MG PO TBDP
4.0000 mg | ORAL_TABLET | Freq: Once | ORAL | Status: AC
  Administered 2018-12-04: 4 mg via ORAL
  Filled 2018-12-04: qty 1

## 2018-12-04 MED ORDER — ONDANSETRON HCL 4 MG PO TABS: 4.0000 mg | ORAL_TABLET | Freq: Four times a day (QID) | ORAL | 0 refills | Status: DC

## 2018-12-04 NOTE — Discharge Instructions (Signed)
Please read attached information. If you experience any new or worsening signs or symptoms please return to the emergency room for evaluation. Please follow-up with your primary care provider or specialist as discussed. Please use medication prescribed only as directed and discontinue taking if you have any concerning signs or symptoms.   °

## 2018-12-04 NOTE — ED Triage Notes (Signed)
Onset 2 days ago developed n/v/d with vaginal discharge.
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
## 2018-12-07 ENCOUNTER — Ambulatory Visit (INDEPENDENT_AMBULATORY_CARE_PROVIDER_SITE_OTHER): Payer: Self-pay | Admitting: Primary Care

## 2018-12-12 ENCOUNTER — Ambulatory Visit (INDEPENDENT_AMBULATORY_CARE_PROVIDER_SITE_OTHER): Payer: Self-pay | Admitting: Primary Care

## 2019-03-20 ENCOUNTER — Telehealth: Payer: Self-pay | Admitting: Physical Medicine & Rehabilitation

## 2019-03-20 NOTE — Telephone Encounter (Signed)
Patient mom came in requesting a new letter. There was a letter issued on April 13, 2018. She needs a new letter with current dates. She stated she needed it by 04/07/19.

## 2019-03-20 NOTE — Telephone Encounter (Signed)
New updated letter printed and will be mailed.

## 2019-05-01 ENCOUNTER — Telehealth: Payer: Self-pay

## 2019-05-01 NOTE — Telephone Encounter (Signed)
Ptn's Mom Leah Rojas came to office and states childcare services for Leah Rojas is questioning your letter.  They state that the letter needs to state details of ongoing issue and that there is no end date.  They told her all you did was copy last year's letter and the letter needs to indicate that the patient currently to day is under care - ongoing issues may not improve and that her loss of skills due to "brain injury" improve.  Putting copy of note on your desk-- she also states she is sorry you are having to rewrite the note but the new administrative folks are insisting the letter you issue was not sufficient. [phone] if you need to call her

## 2019-05-05 ENCOUNTER — Other Ambulatory Visit: Payer: Self-pay | Admitting: Physical Medicine & Rehabilitation

## 2019-05-05 DIAGNOSIS — H8113 Benign paroxysmal vertigo, bilateral: Secondary | ICD-10-CM

## 2019-05-05 DIAGNOSIS — S0291XK Unspecified fracture of skull, subsequent encounter for fracture with nonunion: Secondary | ICD-10-CM

## 2019-05-05 DIAGNOSIS — S0990XS Unspecified injury of head, sequela: Secondary | ICD-10-CM

## 2019-05-05 DIAGNOSIS — F09 Unspecified mental disorder due to known physiological condition: Secondary | ICD-10-CM

## 2019-05-05 DIAGNOSIS — S06309D Unspecified focal traumatic brain injury with loss of consciousness of unspecified duration, subsequent encounter: Secondary | ICD-10-CM

## 2019-05-14 ENCOUNTER — Ambulatory Visit: Payer: Self-pay | Admitting: Physical Medicine & Rehabilitation

## 2019-06-05 ENCOUNTER — Encounter: Payer: Self-pay | Admitting: Physical Medicine & Rehabilitation

## 2019-06-05 ENCOUNTER — Encounter: Payer: Medicare Other | Attending: Physical Medicine & Rehabilitation | Admitting: Physical Medicine & Rehabilitation

## 2019-06-05 ENCOUNTER — Other Ambulatory Visit: Payer: Self-pay

## 2019-06-05 VITALS — BP 126/67 | HR 61 | Temp 97.7°F | Ht 64.0 in | Wt 156.4 lb

## 2019-06-05 DIAGNOSIS — R4689 Other symptoms and signs involving appearance and behavior: Secondary | ICD-10-CM | POA: Insufficient documentation

## 2019-06-05 DIAGNOSIS — S069X0S Unspecified intracranial injury without loss of consciousness, sequela: Secondary | ICD-10-CM

## 2019-06-05 DIAGNOSIS — S06306S Unspecified focal traumatic brain injury with loss of consciousness greater than 24 hours without return to pre-existing conscious level with patient surviving, sequela: Secondary | ICD-10-CM | POA: Diagnosis not present

## 2019-06-05 NOTE — Patient Instructions (Signed)
?  STIMULANT?   LET'S WORK ON SHORT AND LONG TERM GOALS. HOW ARE YOU GOING TO GET THERE. WRITE THEM OUT.   KEEP DAILY AND MONTHLY CALENDAR SOMEWHERE WHERE YOU CAN SEE IT EVERY DAY EASILY  USE THE CALENDAR DAILY TO KEEP TRACK OF THINGS YOU NEED TO DO , APPOINTMENTS, ETC
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
## 2019-06-13 ENCOUNTER — Emergency Department (HOSPITAL_COMMUNITY)
Admission: EM | Admit: 2019-06-13 | Discharge: 2019-06-13 | Disposition: A | Discharge status: Home / Self Care | Payer: Medicare Other | Attending: Emergency Medicine | Admitting: Emergency Medicine

## 2019-06-13 ENCOUNTER — Encounter (HOSPITAL_COMMUNITY): Payer: Self-pay | Admitting: Emergency Medicine

## 2019-06-13 ENCOUNTER — Other Ambulatory Visit: Payer: Self-pay | Admitting: Emergency Medicine

## 2019-06-13 ENCOUNTER — Other Ambulatory Visit: Payer: Self-pay

## 2019-06-13 DIAGNOSIS — L292 Pruritus vulvae: Secondary | ICD-10-CM | POA: Diagnosis present

## 2019-06-13 DIAGNOSIS — Z79899 Other long term (current) drug therapy: Secondary | ICD-10-CM | POA: Diagnosis not present

## 2019-06-13 DIAGNOSIS — R110 Nausea: Secondary | ICD-10-CM | POA: Diagnosis not present

## 2019-06-13 DIAGNOSIS — Z87820 Personal history of traumatic brain injury: Secondary | ICD-10-CM | POA: Insufficient documentation

## 2019-06-13 DIAGNOSIS — B9689 Other specified bacterial agents as the cause of diseases classified elsewhere: Secondary | ICD-10-CM | POA: Diagnosis not present

## 2019-06-13 DIAGNOSIS — N760 Acute vaginitis: Secondary | ICD-10-CM | POA: Insufficient documentation

## 2019-06-13 DIAGNOSIS — Z87891 Personal history of nicotine dependence: Secondary | ICD-10-CM | POA: Diagnosis not present

## 2019-06-13 LAB — URINALYSIS, ROUTINE W REFLEX MICROSCOPIC
Bacteria, UA: NONE SEEN
Bilirubin Urine: NEGATIVE
Glucose, UA: NEGATIVE mg/dL
Hgb urine dipstick: NEGATIVE
Ketones, ur: 20 mg/dL — AB
Nitrite: NEGATIVE
Protein, ur: 30 mg/dL — AB
Specific Gravity, Urine: 1.026 (ref 1.005–1.030)
pH: 5 (ref 5.0–8.0)

## 2019-06-13 LAB — PREGNANCY, URINE: Preg Test, Ur: NEGATIVE

## 2019-06-13 LAB — WET PREP, GENITAL
Sperm: NONE SEEN
Trich, Wet Prep: NONE SEEN
Yeast Wet Prep HPF POC: NONE SEEN

## 2019-06-13 MED ORDER — METRONIDAZOLE 500 MG PO TABS: 500.0000 mg | ORAL_TABLET | Freq: Two times a day (BID) | ORAL | 0 refills | Status: DC

## 2019-06-13 MED ORDER — ONDANSETRON 4 MG PO TBDP: 4.0000 mg | ORAL_TABLET | Freq: Three times a day (TID) | ORAL | 0 refills | Status: DC | PRN

## 2019-06-13 MED ORDER — ONDANSETRON 4 MG PO TBDP
8.0000 mg | ORAL_TABLET | Freq: Once | ORAL | Status: AC
  Administered 2019-06-13: 16:00:00 8 mg via ORAL
  Filled 2019-06-13: qty 2

## 2019-06-13 NOTE — ED Notes (Signed)
Patient verbalizes understanding of discharge instructions. Opportunity for questioning and answers were provided. Armband removed by staff, pt discharged from ED ambulatory.   
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
## 2019-06-13 NOTE — Discharge Instructions (Addendum)
Take Flagyl as prescribed and complete the full course.  Do not drink alcohol while taking Flagyl. Take Zofran as needed as prescribed for nausea and vomiting. Recheck with your provider if symptoms continue.

## 2019-06-13 NOTE — ED Triage Notes (Signed)
Pt states she has felt nauseous today, unable to eat any food. Pt did not vomit. Pt also states she feels like her "yeast is infected". Also a bad smell in her urine. Denies painful urination.

## 2019-06-15 LAB — CERVICOVAGINAL ANCILLARY ONLY
Chlamydia: NEGATIVE
Neisseria Gonorrhea: NEGATIVE

## 2019-08-12 ENCOUNTER — Other Ambulatory Visit: Payer: Self-pay

## 2019-08-12 ENCOUNTER — Encounter (HOSPITAL_COMMUNITY): Payer: Self-pay

## 2019-08-12 ENCOUNTER — Ambulatory Visit (HOSPITAL_COMMUNITY)
Admission: EM | Admit: 2019-08-12 | Discharge: 2019-08-12 | Disposition: A | Discharge status: Home / Self Care | Payer: Medicare Other | Attending: Internal Medicine | Admitting: Internal Medicine

## 2019-08-12 DIAGNOSIS — R110 Nausea: Secondary | ICD-10-CM | POA: Diagnosis not present

## 2019-08-12 DIAGNOSIS — Z3202 Encounter for pregnancy test, result negative: Secondary | ICD-10-CM

## 2019-08-12 DIAGNOSIS — N3000 Acute cystitis without hematuria: Secondary | ICD-10-CM | POA: Insufficient documentation

## 2019-08-12 DIAGNOSIS — N898 Other specified noninflammatory disorders of vagina: Secondary | ICD-10-CM | POA: Diagnosis not present

## 2019-08-12 LAB — POCT URINALYSIS DIP (DEVICE)
Bilirubin Urine: NEGATIVE
Glucose, UA: NEGATIVE mg/dL
Ketones, ur: 15 mg/dL — AB
Nitrite: POSITIVE — AB
Protein, ur: NEGATIVE mg/dL
Specific Gravity, Urine: >=1.03 (ref 1.005–1.030)
Urobilinogen, UA: 0.2 mg/dL (ref 0.0–1.0)
pH: 6 (ref 5.0–8.0)

## 2019-08-12 LAB — POCT PREGNANCY, URINE: Preg Test, Ur: NEGATIVE

## 2019-08-12 MED ORDER — NITROFURANTOIN MONOHYD MACRO 100 MG PO CAPS: 100.0000 mg | ORAL_CAPSULE | Freq: Two times a day (BID) | ORAL | 0 refills | Status: AC

## 2019-08-12 MED ORDER — ONDANSETRON 4 MG PO TBDP: 4.0000 mg | ORAL_TABLET | Freq: Three times a day (TID) | ORAL | 0 refills | Status: DC | PRN
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
## 2019-08-12 NOTE — ED Notes (Signed)
Pt requested a BP recheck due to it being high on triage.

## 2019-08-12 NOTE — Discharge Instructions (Signed)
Your urine is consistent with urinary tract infection, which likely is the source of your symptoms.  We will start with treatment for this, awaiting results from your vaginal swab to initiate any further treatment.  Zofran every 8 hours as needed for nausea or vomiting.   If symptoms worsen or do not improve in the next week to return to be seen or to follow up with your PCP.

## 2019-08-12 NOTE — ED Triage Notes (Signed)
Pt presents with nausea, vaginal itching and discomfort for past few days.

## 2019-08-14 ENCOUNTER — Telehealth (HOSPITAL_COMMUNITY): Payer: Self-pay | Admitting: Emergency Medicine

## 2019-08-14 LAB — CERVICOVAGINAL ANCILLARY ONLY
Bacterial vaginitis: POSITIVE — AB
Candida vaginitis: POSITIVE — AB
Chlamydia: NEGATIVE
Neisseria Gonorrhea: NEGATIVE
Trichomonas: NEGATIVE

## 2019-08-14 MED ORDER — METRONIDAZOLE 500 MG PO TABS: 500.0000 mg | ORAL_TABLET | Freq: Two times a day (BID) | ORAL | 0 refills | Status: AC

## 2019-08-14 MED ORDER — FLUCONAZOLE 150 MG PO TABS: 150.0000 mg | ORAL_TABLET | Freq: Once | ORAL | 0 refills | Status: AC

## 2019-08-14 NOTE — Telephone Encounter (Signed)
Bacterial vaginosis is positive. This was not treated at the urgent care visit.  Flagyl 500 mg BID x 7 days #14 no refills sent to patients pharmacy of choice.    Test for candida (yeast) was positive.  Prescription for fluconazole 150mg  po now, repeat dose in 3d if needed, #2 no refills, sent to the pharmacy of record.  Recheck or followup with PCP for further evaluation if symptoms are not improving.    Patient contacted and made aware of    results. Pt verbalized understanding and had all questions answered. Will only call if macrobid is resistant to urine culture.

## 2019-08-15 LAB — URINE CULTURE: Culture: 80000 — AB

## 2019-09-02 ENCOUNTER — Other Ambulatory Visit: Payer: Self-pay

## 2019-09-02 ENCOUNTER — Ambulatory Visit (INDEPENDENT_AMBULATORY_CARE_PROVIDER_SITE_OTHER): Payer: Medicare Other | Admitting: Otolaryngology

## 2019-10-02 ENCOUNTER — Encounter: Payer: Medicare Other | Admitting: Physical Medicine & Rehabilitation

## 2019-10-29 ENCOUNTER — Other Ambulatory Visit: Payer: Self-pay | Admitting: Physical Medicine & Rehabilitation

## 2019-10-29 DIAGNOSIS — F09 Unspecified mental disorder due to known physiological condition: Secondary | ICD-10-CM

## 2019-10-29 DIAGNOSIS — S0990XS Unspecified injury of head, sequela: Secondary | ICD-10-CM

## 2019-10-29 DIAGNOSIS — S0291XK Unspecified fracture of skull, subsequent encounter for fracture with nonunion: Secondary | ICD-10-CM

## 2019-10-29 DIAGNOSIS — H8113 Benign paroxysmal vertigo, bilateral: Secondary | ICD-10-CM

## 2019-10-30 ENCOUNTER — Encounter: Payer: Self-pay | Admitting: Physical Medicine & Rehabilitation

## 2019-10-30 ENCOUNTER — Encounter: Payer: Medicare Other | Attending: Physical Medicine & Rehabilitation | Admitting: Physical Medicine & Rehabilitation

## 2019-10-30 ENCOUNTER — Other Ambulatory Visit: Payer: Self-pay

## 2019-10-30 DIAGNOSIS — M7918 Myalgia, other site: Secondary | ICD-10-CM
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
## 2019-10-30 NOTE — Patient Instructions (Addendum)
TYLENOL:  500MG  EVERY 5 HOURS AS NEEDED FOR PAIN  IBUPROFEN: 200-600MG  EVERY 8 HOURS AS NEEDED  YOU CAN TAKE THEM TOGETHER OR SPACE THEM APART TAKE IBUPROFEN WITH FOOD OR DRINK   REGULAR HEAT TO YOUR NECK.  DO THE STRETCHES I GAVE  YOU   IF THINGS ARE NOT GETTING BETTER IN A WEEK, THEN CALL ME.    COVID-19 Vaccine Information can be found at: For questions related to vaccine distribution or appointments, please email [email or web address] or call [phone].

## 2020-01-01 ENCOUNTER — Ambulatory Visit: Payer: Medicare Other | Admitting: Physical Medicine & Rehabilitation

## 2020-01-15 ENCOUNTER — Encounter: Payer: Medicare Other | Attending: Physical Medicine & Rehabilitation | Admitting: Physical Medicine & Rehabilitation

## 2020-01-15 DIAGNOSIS — M7918 Myalgia, other site: Secondary | ICD-10-CM | POA: Insufficient documentation

## 2020-03-11 ENCOUNTER — Ambulatory Visit: Payer: Medicare Other | Admitting: Physical Medicine & Rehabilitation

## 2020-03-25 ENCOUNTER — Encounter: Payer: Self-pay | Admitting: General Practice

## 2020-03-25 ENCOUNTER — Telehealth (INDEPENDENT_AMBULATORY_CARE_PROVIDER_SITE_OTHER): Payer: Medicare Other | Admitting: Primary Care

## 2020-04-01 ENCOUNTER — Encounter: Payer: Medicare Other | Admitting: Certified Nurse Midwife

## 2020-04-01 ENCOUNTER — Ambulatory Visit (INDEPENDENT_AMBULATORY_CARE_PROVIDER_SITE_OTHER): Payer: Medicare Other | Admitting: Primary Care

## 2020-04-01 ENCOUNTER — Encounter (INDEPENDENT_AMBULATORY_CARE_PROVIDER_SITE_OTHER): Payer: Self-pay | Admitting: Primary Care

## 2020-04-01 ENCOUNTER — Other Ambulatory Visit: Payer: Self-pay

## 2020-04-01 VITALS — BP 138/78 | HR 62 | Temp 98.6°F | Ht 65.0 in | Wt 145.0 lb

## 2020-04-01 DIAGNOSIS — N390 Urinary tract infection, site not specified: Secondary | ICD-10-CM

## 2020-04-01 DIAGNOSIS — Z7689 Persons encountering health services in other specified circumstances: Secondary | ICD-10-CM

## 2020-04-01 DIAGNOSIS — N898 Other specified noninflammatory disorders of vagina: Secondary | ICD-10-CM

## 2020-04-01 DIAGNOSIS — R030 Elevated blood-pressure reading, without diagnosis of hypertension: Secondary | ICD-10-CM | POA: Diagnosis not present

## 2020-04-01 LAB — POCT URINALYSIS DIP (CLINITEK)
Bilirubin, UA: NEGATIVE
Blood, UA: NEGATIVE
Glucose, UA: NEGATIVE mg/dL
Nitrite, UA: POSITIVE — AB
POC PROTEIN,UA: NEGATIVE
Spec Grav, UA: >=1.03 — AB (ref 1.010–1.025)
Urobilinogen, UA: 1 E.U./dL
pH, UA: 6 (ref 5.0–8.0)

## 2020-04-01 MED ORDER — FLUCONAZOLE 150 MG PO TABS: 150.0000 mg | ORAL_TABLET | Freq: Once | ORAL | 0 refills | Status: AC

## 2020-04-01 MED ORDER — SULFAMETHOXAZOLE-TRIMETHOPRIM 800-160 MG PO TABS: 1.0000 | ORAL_TABLET | Freq: Two times a day (BID) | ORAL | 0 refills | Status: DC

## 2020-04-01 NOTE — Progress Notes (Signed)
Established Patient Office Visit  Subjective:  Patient ID: Leah Rojas, female    DOB: [birth year]  Age: 29 y.o. MRN: [id number]  CC:  Chief Complaint  Patient presents with  . New Patient (Initial Visit)    urinary odor     HPI [name] is a 29 year old female who presents today to establish care and has several concerns. First vaginal odor wanting a pap and urinary frequency. Blood pressure is slightly elevated . Denies shortness of breath, headaches, chest pain or lower extremity edema  Past Medical History:  Diagnosis Date  . Bronchitis    rescue inhaler prn  . Traumatic brain injury [facility])     Past Surgical History:  Procedure Laterality Date  . NO PAST SURGERIES      Family History  Problem Relation Age of Onset  . Heart Problems Mother   . Mental retardation Cousin   . Other Neg Hx   . Colon cancer Neg Hx   . Esophageal cancer Neg Hx   . Stomach cancer Neg Hx   . Pancreatic cancer Neg Hx   . Colon polyps Neg Hx   . Diabetes Neg Hx   . Kidney disease Neg Hx   . Liver disease Neg Hx     Social History   Socioeconomic History  . Marital status: Single    Spouse name: Not on file  . Number of children: Not on file  . Years of education: Not on file  . Highest education level: Not on file  Occupational History  . Not on file  Tobacco Use  . Smoking status: Former Smoker    Packs/day: 0.25    Types: Cigarettes    Quit date: 12/18/2014    Years since quitting: 5.2  . Smokeless tobacco: Never Used  Substance and Sexual Activity  . Alcohol use: No    Alcohol/week: 0.0 standard drinks  . Drug use: No  . Sexual activity: Yes    Birth control/protection: Injection  Other Topics Concern  . Not on file  Social History Narrative  . Not on file   Social Determinants of Health   Financial Resource Strain:   . Difficulty of Paying Living Expenses:   Food Insecurity:   . Worried About Programme researcher, broadcasting/film/video in the Last Year:   . Barista in  the Last Year:   Transportation Needs:   . Freight forwarder (Medical):   Marland Kitchen Lack of Transportation (Non-Medical):   Physical Activity:   . Days of Exercise per Week:   . Minutes of Exercise per Session:   Stress:   . Feeling of Stress :   Social Connections:   . Frequency of Communication with Friends and Family:   . Frequency of Social Gatherings with Friends and Family:   . Attends Religious Services:   . Active Member of Clubs or Organizations:   . Attends Banker Meetings:   Marland Kitchen Marital Status:   Intimate Partner Violence:   . Fear of Current or Ex-Partner:   . Emotionally Abused:   Marland Kitchen Physically Abused:   . Sexually Abused:     Outpatient Medications Prior to Visit  Medication Sig Dispense Refill  . sertraline (ZOLOFT) 25 MG tablet TAKE 1 TABLET BY MOUTH EVERY DAY 90 tablet 1  . traZODone (DESYREL) 50 MG tablet Take 0.5-1 tablets (25-50 mg total) by mouth at bedtime. 30 tablet 2  . medroxyPROGESTERone (DEPO-PROVERA) 150 MG/ML injection Inject 1 mL (150  mg total) into the muscle once for 1 dose. 1 mL 0  . clotrimazole (LOTRIMIN) 1 % cream Apply to affected area 2 times daily 15 g 0  . clotrimazole-betamethasone (LOTRISONE) cream Apply to affected area 2 times daily prn 15 g 0  . famotidine (PEPCID) 20 MG tablet Take 1 tablet (20 mg total) by mouth 2 (two) times daily. 30 tablet 0  . ketoconazole (NIZORAL) 2 % cream Apply 1 application topically 2 (two) times daily. 30 g 0  . metroNIDAZOLE (FLAGYL) 500 MG tablet Take 1 tablet (500 mg total) by mouth 2 (two) times daily. 14 tablet 0  . ondansetron (ZOFRAN ODT) 4 MG disintegrating tablet Take 1 tablet (4 mg total) by mouth every 8 (eight) hours as needed for nausea or vomiting. 12 tablet 0  . ondansetron (ZOFRAN) 4 MG tablet Take 1 tablet (4 mg total) by mouth every 6 (six) hours. 12 tablet 0  . propranolol (INDERAL) 20 MG tablet TAKE 1 TABLET BY MOUTH THREE TIMES A DAY 90 tablet 3  . triamcinolone cream (KENALOG)  0.1 % Apply 1 application topically 2 (two) times daily. 30 g 0   No facility-administered medications prior to visit.    No Known Allergies  ROS Review of Systems  Gastrointestinal: Positive for nausea.  Genitourinary: Positive for frequency.       Vaginal odor   Neurological: Positive for headaches.       Hx TBI   All other systems reviewed and are negative.     Objective:    Physical Exam Vitals reviewed.  Constitutional:      Appearance: Normal appearance.  HENT:     Head: Normocephalic.     Right Ear: Tympanic membrane normal.     Left Ear: Tympanic membrane normal.     Nose: Nose normal.  Eyes:     Extraocular Movements: Extraocular movements intact.     Pupils: Pupils are equal, round, and reactive to light.  Cardiovascular:     Rate and Rhythm: Regular rhythm.     Pulses: Normal pulses.     Heart sounds: Normal heart sounds.  Pulmonary:     Effort: Pulmonary effort is normal.     Breath sounds: Normal breath sounds.  Abdominal:     General: Bowel sounds are normal.  Musculoskeletal:        General: Normal range of motion.     Cervical back: Normal range of motion and neck supple.  Skin:    General: Skin is warm and dry.  Neurological:     Mental Status: She is alert and oriented to person, place, and time.  Psychiatric:        Mood and Affect: Mood normal.        Behavior: Behavior normal.        Thought Content: Thought content normal.        Judgment: Judgment normal.     BP 138/78 (BP Location: Right Arm, Patient Position: Sitting, Cuff Size: Normal)   Pulse 62   Temp 98.6 F (37 C) (Oral)   Ht 5\' 5"  (1.651 m)   Wt 145 lb (65.8 kg)   LMP 03/20/2020 (Exact Date)   SpO2 97%   BMI 24.13 kg/m  Wt Readings from Last 3 Encounters:  04/01/20 145 lb (65.8 kg)  10/30/19 149 lb (67.6 kg)  06/13/19 154 lb (69.9 kg)     Health Maintenance Due  Topic Date Due  . Hepatitis C Screening  Never done  . COVID-19  Vaccine (1) Never done  . PAP  SMEAR-Modifier  02/09/2020    There are no preventive care reminders to display for this patient.  No results found for: TSH Lab Results  Component Value Date   WBC 7.3 10/02/2018   HGB 11.4 (L) 10/02/2018   HCT 36.0 10/02/2018   MCV 97.6 10/02/2018   PLT 291 10/02/2018   Lab Results  Component Value Date   NA 140 10/02/2018   K 4.0 10/02/2018   CO2 22 10/02/2018   GLUCOSE 94 10/02/2018   BUN 17 10/02/2018   CREATININE 0.64 10/02/2018   BILITOT 0.7 10/02/2018   ALKPHOS 39 10/02/2018   AST 14 (L) 10/02/2018   ALT 16 10/02/2018   PROT 6.8 10/02/2018   ALBUMIN 3.7 10/02/2018   CALCIUM 8.9 10/02/2018   ANIONGAP 7 10/02/2018   No results found for: CHOL No results found for: HDL No results found for: [facility] Lab Results  Component Value Date   TRIG 81 01/14/2015   No results found for: CHOLHDL No results found for: BDZH2D Leah Rojas was seen today for new patient (initial visit).  Diagnoses and all orders for this visit:  Encounter to establish care Gwinda Passe, NP-C will be your  (PCP) she is mastered prepared . She is skilled to diagnosed and treat illness. Also able to answer health concern as well as continuing care of varied medical conditions, not limited by cause, organ system, or diagnosis.   Vaginal odor -     POCT URINALYSIS DIP (CLINITEK)  Urinary tract infection without hematuria, site unspecified -     Urine Culture Treat with septra ds . Discussed how she was wiping after using the bathroom she wipes back to front likelihood of ecoli cause of UTI  Elevated blood pressure reading without diagnosis of hypertension Counseled on blood pressure goal of less than 130/80, low-sodium, DASH diet, medication compliance, 150 minutes of moderate intensity exercise per week. Life style modification first step     Meds ordered this encounter  Medications  . sulfamethoxazole-trimethoprim (BACTRIM DS) 800-160 MG tablet    Sig: Take 1 tablet by mouth 2 (two)  times daily.    Dispense:  10 tablet    Refill:  0  . fluconazole (DIFLUCAN) 150 MG tablet    Sig: Take 1 tablet (150 mg total) by mouth once for 1 dose.    Dispense:  1 tablet    Refill:  0    Follow-up: Return for pap.    Grayce Sessions, NP

## 2020-04-01 NOTE — Patient Instructions (Signed)
Acute Urinary Retention, Female  Acute urinary retention means that you cannot pee (urinate) at all, or that you pee too little and your bladder is not emptied completely. If it is not treated, it can lead to kidney damage or other serious problems. Follow these instructions at home:  Take over-the-counter and prescription medicines only as told by your doctor. Ask your doctor what medicines you should stay away from. Do not take any medicine unless your doctor says it is okay to do so.  If you were sent home with a tube that drains pee from the bladder (catheter), take care of it as told by your doctor.  Drink enough fluid to keep your pee clear or pale yellow.  If you were given an antibiotic, take it as told by your doctor. Do not stop taking the antibiotic even if you start to feel better.  Do not use any products that contain nicotine or tobacco, such as cigarettes and e-cigarettes. If you need help quitting, ask your doctor.  Watch for changes in your symptoms. Tell your doctor about them.  If told, keep track of any changes in your blood pressure at home. Tell your doctor about them.  Keep all follow-up visits as told by your doctor. This is important. Contact a doctor if:  You have spasms or you leak pee when you have spasms. Get help right away if:  You have chills or a fever.  You have blood in your pee.  You have a tube that drains the bladder and: ? The tube stops draining pee. ? The tube falls out. Summary  Acute urinary retention means that you cannot pee at all, or that you pee too little and your bladder is not emptied completely. If it is not treated, it can result in kidney damage or other serious problems.  If you were sent home with a tube that drains pee from the bladder, take care of it as told by your doctor.  Pay attention to any changes in your symptoms. Tell your doctor about them. This information is not intended to replace advice given to you by your  health care provider. Make sure you discuss any questions you have with your health care provider. Document Revised: 08/25/2017 Document Reviewed: 10/14/2016 Elsevier Patient Education  2020 Elsevier Inc.  

## 2020-04-04 LAB — URINE CULTURE

## 2020-04-07 ENCOUNTER — Encounter (INDEPENDENT_AMBULATORY_CARE_PROVIDER_SITE_OTHER): Payer: Self-pay | Admitting: Primary Care

## 2020-04-07 ENCOUNTER — Other Ambulatory Visit (HOSPITAL_COMMUNITY)
Admission: RE | Admit: 2020-04-07 | Discharge: 2020-04-07 | Disposition: A | Discharge status: Home / Self Care | Payer: Medicare Other | Source: Ambulatory Visit | Attending: Primary Care | Admitting: Primary Care

## 2020-04-07 ENCOUNTER — Ambulatory Visit (INDEPENDENT_AMBULATORY_CARE_PROVIDER_SITE_OTHER): Payer: Medicare Other | Admitting: Primary Care

## 2020-04-07 ENCOUNTER — Other Ambulatory Visit: Payer: Self-pay

## 2020-04-07 VITALS — BP 128/85 | HR 86 | Temp 98.0°F | Ht 65.0 in | Wt 140.6 lb

## 2020-04-07 DIAGNOSIS — Z124 Encounter for screening for malignant neoplasm of cervix: Secondary | ICD-10-CM | POA: Diagnosis not present

## 2020-04-07 DIAGNOSIS — Z113 Encounter for screening for infections with a predominantly sexual mode of transmission: Secondary | ICD-10-CM

## 2020-04-07 NOTE — Progress Notes (Signed)
Subjective:     Leah Rojas is a 29 y.o. female and is here for a comprehensive physical exam. The patient reports little white vaginal discharge.  Social History   Socioeconomic History  . Marital status: Single    Spouse name: Not on file  . Number of children: Not on file  . Years of education: Not on file  . Highest education level: Not on file  Occupational History  . Not on file  Tobacco Use  . Smoking status: Former Smoker    Packs/day: 0.25    Types: Cigarettes    Quit date: 12/18/2014    Years since quitting: 5.3  . Smokeless tobacco: Never Used  Substance and Sexual Activity  . Alcohol use: No    Alcohol/week: 0.0 standard drinks  . Drug use: No  . Sexual activity: Yes    Birth control/protection: Injection  Other Topics Concern  . Not on file  Social History Narrative  . Not on file   Social Determinants of Health   Financial Resource Strain:   . Difficulty of Paying Living Expenses:   Food Insecurity:   . Worried About Programme researcher, broadcasting/film/video in the Last Year:   . Barista in the Last Year:   Transportation Needs:   . Freight forwarder (Medical):   Marland Kitchen Lack of Transportation (Non-Medical):   Physical Activity:   . Days of Exercise per Week:   . Minutes of Exercise per Session:   Stress:   . Feeling of Stress :   Social Connections:   . Frequency of Communication with Friends and Family:   . Frequency of Social Gatherings with Friends and Family:   . Attends Religious Services:   . Active Member of Clubs or Organizations:   . Attends Banker Meetings:   Marland Kitchen Marital Status:   Intimate Partner Violence:   . Fear of Current or Ex-Partner:   . Emotionally Abused:   Marland Kitchen Physically Abused:   . Sexually Abused:    Health Maintenance  Topic Date Due  . Hepatitis C Screening  Never done  . COVID-19 Vaccine (1) Never done  . PAP SMEAR-Modifier  02/09/2020  . INFLUENZA VACCINE  04/26/2020  . TETANUS/TDAP  12/07/2022  . HIV Screening   Completed      Review of Systems Pertinent items noted in HPI and remainder of comprehensive ROS otherwise negative.   Objective:   BP 128/85 (BP Location: Right Arm, Patient Position: Sitting, Cuff Size: Normal)   Pulse 86   Temp 98 F (36.7 C) (Oral)   Ht 5\' 5"  (1.651 m)   Wt 140 lb 9.6 oz (63.8 kg)   LMP 03/20/2020 (Exact Date)   SpO2 100%   BMI 23.40 kg/m  Assessment:  CONSTITUTIONAL: Well-developed, well-nourished female in no acute distress.  HENT:  Normocephalic, atraumatic, External right and left ear normal. Oropharynx is clear and moist EYES: Conjunctivae and EOM are normal. Pupils are equal, round, and reactive to light. No scleral icterus.  NECK: Normal range of motion, supple, no masses.  Normal thyroid.  SKIN: Skin is warm and dry. No rash noted. Not diaphoretic. No erythema. No pallor. NEUROLGIC: Alert and oriented to person, place, and time. Normal reflexes, muscle tone coordination. No cranial nerve deficit noted. PSYCHIATRIC: Normal mood and affect. Normal behavior. Normal judgment and thought content. CARDIOVASCULAR: Normal heart rate noted, regular rhythm RESPIRATORY: Clear to auscultation bilaterally. Effort and breath sounds normal, no problems with respiration noted. BREASTS:Taught SBE ABDOMEN:  Soft, normal bowel sounds, no distention noted.  No tenderness, rebound or guarding.  PELVIC: Normal appearing external genitalia; normal appearing vaginal mucosa and cervix.  No abnormal discharge noted.  Pap smear obtained.  Normal uterine size, no other palpable masses, no uterine or adnexal tenderness. MUSCULOSKELETAL: Normal range of motion. No tenderness.  No cyanosis, clubbing, or edema.  2+ distal pulses.   Plan:  [name] was seen today for gynecologic exam.  Diagnoses and all orders for this visit:  Cervical cancer screening -     Cytology - PAP  Screening for STD (sexually transmitted disease) -     Cervicovaginal ancillary only     See After  Visit Summary for Counseling Recommendations

## 2020-04-07 NOTE — Patient Instructions (Signed)

## 2020-04-08 ENCOUNTER — Encounter: Payer: Self-pay | Admitting: Physical Medicine & Rehabilitation

## 2020-04-08 ENCOUNTER — Ambulatory Visit (INDEPENDENT_AMBULATORY_CARE_PROVIDER_SITE_OTHER): Payer: Medicare Other | Admitting: Medical

## 2020-04-08 ENCOUNTER — Other Ambulatory Visit: Payer: Self-pay

## 2020-04-08 ENCOUNTER — Encounter: Payer: Self-pay | Admitting: Medical

## 2020-04-08 ENCOUNTER — Encounter: Payer: Medicare Other | Attending: Physical Medicine & Rehabilitation | Admitting: Physical Medicine & Rehabilitation

## 2020-04-08 VITALS — BP 114/70 | HR 103 | Temp 98.8°F | Ht 65.0 in | Wt 139.6 lb

## 2020-04-08 VITALS — BP 121/75 | HR 80 | Temp 98.6°F | Ht 65.0 in | Wt 138.8 lb

## 2020-04-08 DIAGNOSIS — S069X0S Unspecified intracranial injury without loss of consciousness, sequela: Secondary | ICD-10-CM

## 2020-04-08 DIAGNOSIS — S0990XS Unspecified injury of head, sequela: Secondary | ICD-10-CM | POA: Diagnosis not present

## 2020-04-08 DIAGNOSIS — M7918 Myalgia, other site: Secondary | ICD-10-CM | POA: Diagnosis not present

## 2020-04-08 DIAGNOSIS — R4689 Other symptoms and signs involving appearance and behavior: Secondary | ICD-10-CM

## 2020-04-08 DIAGNOSIS — Z3009 Encounter for other general counseling and advice on contraception: Secondary | ICD-10-CM | POA: Diagnosis not present

## 2020-04-08 DIAGNOSIS — S0291XK Unspecified fracture of skull, subsequent encounter for fracture with nonunion: Secondary | ICD-10-CM | POA: Insufficient documentation

## 2020-04-08 DIAGNOSIS — S06309D Unspecified focal traumatic brain injury with loss of consciousness of unspecified duration, subsequent encounter: Secondary | ICD-10-CM

## 2020-04-08 DIAGNOSIS — F329 Major depressive disorder, single episode, unspecified: Secondary | ICD-10-CM | POA: Diagnosis not present

## 2020-04-08 DIAGNOSIS — F09 Unspecified mental disorder due to known physiological condition: Secondary | ICD-10-CM | POA: Insufficient documentation

## 2020-04-08 DIAGNOSIS — H8113 Benign paroxysmal vertigo, bilateral: Secondary | ICD-10-CM

## 2020-04-08 LAB — CERVICOVAGINAL ANCILLARY ONLY
Bacterial Vaginitis (gardnerella): NEGATIVE
Candida Glabrata: NEGATIVE
Candida Vaginitis: NEGATIVE
Chlamydia: NEGATIVE
Comment: NEGATIVE
Comment: NEGATIVE
Comment: NEGATIVE
Comment: NEGATIVE
Comment: NEGATIVE
Comment: NORMAL
Neisseria Gonorrhea: NEGATIVE
Trichomonas: NEGATIVE

## 2020-04-08 MED ORDER — SERTRALINE HCL 50 MG PO TABS: 50.0000 mg | ORAL_TABLET | Freq: Every day | ORAL | 5 refills | Status: DC

## 2020-04-08 NOTE — Patient Instructions (Signed)
PLEASE FEEL FREE TO CALL OUR OFFICE WITH ANY PROBLEMS OR QUESTIONS ([phone])      

## 2020-04-08 NOTE — Progress Notes (Signed)
  History:  [name] is a 29 y.o. G2P1011 who presents to clinic today for birth control counseling. The patient was referred from [facility]. She had full physical with pap smear and STD testing there yesterday. LMP 03/20/20. Last intercourse Monday, unprotected. She would like to discuss all options for birth control.    The following portions of the patient's history were reviewed and updated as appropriate: allergies, current medications, family history, past medical history, social history, past surgical history and problem list.  Review of Systems:  Review of Systems  Constitutional: Negative for fever and malaise/fatigue.  Gastrointestinal: Negative for abdominal pain.  Genitourinary:       Neg - vaginal bleeding, discharge      Objective:  Physical Exam BP 114/70 (BP Location: Right Arm, Patient Position: Sitting, Cuff Size: Normal)   Pulse (!) 103   Temp 98.8 F (37.1 C) (Oral)   Ht 5\' 5"  (1.651 m)   Wt 139 lb 9.6 oz (63.3 kg)   LMP 03/20/2020 (Exact Date)   BMI 23.23 kg/m  Physical Exam Vitals and nursing note reviewed.  Constitutional:      General: She is not in acute distress.    Appearance: She is well-developed.  Eyes:     Extraocular Movements: Extraocular movements intact.     Conjunctiva/sclera: Conjunctivae normal.  Cardiovascular:     Rate and Rhythm: Tachycardia present.  Pulmonary:     Effort: Pulmonary effort is normal.  Abdominal:     General: There is no distension.     Palpations: Abdomen is soft.     Tenderness: There is no rebound.  Skin:    General: Skin is warm and dry.     Findings: No erythema.  Neurological:     Mental Status: She is alert and oriented to person, place, and time.     Assessment & Plan:  1. Birth control counseling - All options for birth control discussed including OCPs, Depo Provera, Patch, Ring, Nexplanon, IUD and BTL. Patient is interested in IUD.  - Due to last intercourse and timing of  cycle, will need to schedule IUD insertion for end of July  - Patient advised to use condoms or abstain from intercourse until IUD insertion   Approximately 15 minutes of total time was spent with this patient   August 04/08/2020 3:27 PM
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
## 2020-04-08 NOTE — Patient Instructions (Signed)

## 2020-04-09 LAB — CYTOLOGY - PAP: Diagnosis: UNDETERMINED — AB

## 2020-04-14 ENCOUNTER — Other Ambulatory Visit (INDEPENDENT_AMBULATORY_CARE_PROVIDER_SITE_OTHER): Payer: Self-pay | Admitting: Primary Care

## 2020-04-14 DIAGNOSIS — R87629 Unspecified abnormal cytological findings in specimens from vagina: Secondary | ICD-10-CM

## 2020-04-23 ENCOUNTER — Other Ambulatory Visit: Payer: Self-pay | Admitting: Physical Medicine & Rehabilitation

## 2020-04-23 DIAGNOSIS — S06309D Unspecified focal traumatic brain injury with loss of consciousness of unspecified duration, subsequent encounter: Secondary | ICD-10-CM

## 2020-04-23 DIAGNOSIS — S0990XS Unspecified injury of head, sequela: Secondary | ICD-10-CM

## 2020-04-23 DIAGNOSIS — H8113 Benign paroxysmal vertigo, bilateral: Secondary | ICD-10-CM

## 2020-04-24 ENCOUNTER — Ambulatory Visit (INDEPENDENT_AMBULATORY_CARE_PROVIDER_SITE_OTHER): Payer: Medicare Other

## 2020-04-24 ENCOUNTER — Other Ambulatory Visit: Payer: Self-pay

## 2020-04-24 VITALS — BP 111/82 | HR 91 | Temp 98.1°F | Ht 65.0 in | Wt 139.6 lb

## 2020-04-24 DIAGNOSIS — Z719 Counseling, unspecified: Secondary | ICD-10-CM

## 2020-04-24 DIAGNOSIS — R87610 Atypical squamous cells of undetermined significance on cytologic smear of cervix (ASC-US): Secondary | ICD-10-CM | POA: Diagnosis not present

## 2020-04-24 NOTE — Progress Notes (Signed)
GYNECOLOGY PROBLEM OFFICE VISIT NOTE  History:  Leah Rojas is a 29 y.o. G2P1011 here today for education and discussion regarding her recent pap smear results. She reports she had her pap completed at primary care and it returned abnormal.  Provider reviews results as well as physical exam.  Pap is ASCUS with no Cotesting completed.  Physical exam without findings of concern regarding pelvic area, vaginal vault, and cervix. She denies any abnormal vaginal discharge, bleeding, pelvic pain or other concerns. She expresses a desire for birth control, but no longer desires IUD and wants Depo Provera.  Patient declines initiation of method today. Patient with her mother on the phone for more than 50% of the appt.   Past Medical History:  Diagnosis Date   Bronchitis    rescue inhaler prn   Traumatic brain injury [facility])     Past Surgical History:  Procedure Laterality Date   NO PAST SURGERIES      The following portions of the patient's history were reviewed and updated as appropriate: allergies, current medications, past family history, past medical history, past social history, past surgical history and problem list.   Health Maintenance:  ASCUS pap and no HRHPV on April 09, 2020.  No mammogram d/t age.   Review of Systems:  Genito-Urinary ROS: no dysuria, trouble voiding, or hematuria   Objective:  Vitals: BP 111/82 (BP Location: Left Arm, Patient Position: Sitting, Cuff Size: Normal)    Pulse 91    Temp 98.1 F (36.7 C) (Oral)    Ht 5\' 5"  (1.651 m)    Wt 139 lb 9.6 oz (63.3 kg)    LMP 04/13/2020 (Exact Date)    BMI 23.23 kg/m   Physical Exam: Physical Exam Constitutional:      Appearance: Normal appearance.  HENT:     Head: Normocephalic and atraumatic.  Eyes:     Conjunctiva/sclera: Conjunctivae normal.  Cardiovascular:     Rate and Rhythm: Normal rate.  Musculoskeletal:        General: Normal range of motion.     Cervical back: Normal range of motion.  Neurological:      Mental Status: She is alert and oriented to person, place, and time.  Skin:    General: Skin is warm and dry.  Psychiatric:        Mood and Affect: Mood normal.        Behavior: Behavior normal.        Thought Content: Thought content normal.        Judgment: Judgment normal.      Labs and Imaging: No results found.  Assessment & Plan:  29 year old Encounter for Education regarding Results ASCUS  -Provider reassures patient and mother that findings are of very low suspicion for cervical cancer. -Discussed follow up methods based on ASCCP guidelines to include cotesting, which is not currently an option, or retest in one year. -Explained how cervical cancer is slow growing cancer and that repeating in one year is acceptable.  -Patient and mother without further concerns. -Patient questions regarding birth control methods addressed.  Reassured that IUD does interfere with pap smears. -Patient expresses concern regarding insertion.  R/B of IUD insertion and management. -Patient opts for Depo Provera, but would like to receive after returning from vacation. -Patient to schedule at her convenience. -Plan for repeat pap with cotest in one year at [facility].    Total face-to-face time with patient: 10 minutes   [facility], CNM 04/24/2020 11:52 AM

## 2020-04-24 NOTE — Patient Instructions (Signed)

## 2020-05-06 ENCOUNTER — Ambulatory Visit: Payer: Medicare Other

## 2020-05-19 ENCOUNTER — Ambulatory Visit: Payer: Medicare Other

## 2020-06-23 ENCOUNTER — Other Ambulatory Visit: Payer: Self-pay | Admitting: Physical Medicine & Rehabilitation

## 2020-07-23 ENCOUNTER — Encounter (HOSPITAL_COMMUNITY): Payer: Self-pay | Admitting: Emergency Medicine

## 2020-07-23 ENCOUNTER — Other Ambulatory Visit: Payer: Self-pay

## 2020-07-23 ENCOUNTER — Ambulatory Visit (HOSPITAL_COMMUNITY)
Admission: EM | Admit: 2020-07-23 | Discharge: 2020-07-23 | Disposition: A | Discharge status: Home / Self Care | Payer: Medicare Other | Attending: Internal Medicine | Admitting: Internal Medicine

## 2020-07-23 DIAGNOSIS — Z3202 Encounter for pregnancy test, result negative: Secondary | ICD-10-CM

## 2020-07-23 DIAGNOSIS — N898 Other specified noninflammatory disorders of vagina: Secondary | ICD-10-CM | POA: Insufficient documentation

## 2020-07-23 DIAGNOSIS — N390 Urinary tract infection, site not specified: Secondary | ICD-10-CM | POA: Diagnosis not present

## 2020-07-23 LAB — POCT URINALYSIS DIPSTICK, ED / UC
Bilirubin Urine: NEGATIVE
Glucose, UA: NEGATIVE mg/dL
Hgb urine dipstick: NEGATIVE
Ketones, ur: NEGATIVE mg/dL
Leukocytes,Ua: NEGATIVE
Nitrite: NEGATIVE
Protein, ur: NEGATIVE mg/dL
Specific Gravity, Urine: >=1.03 (ref 1.005–1.030)
Urobilinogen, UA: 0.2 mg/dL (ref 0.0–1.0)
pH: 6 (ref 5.0–8.0)

## 2020-07-23 LAB — POC URINE PREG, ED: Preg Test, Ur: NEGATIVE

## 2020-07-23 MED ORDER — METRONIDAZOLE 500 MG PO TABS: 500.0000 mg | ORAL_TABLET | Freq: Two times a day (BID) | ORAL | 0 refills | Status: DC
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
## 2020-07-23 NOTE — ED Triage Notes (Signed)
Pt c/o dysuria and frequent urination x 3 days. She states that she has an odor with her urine that smells like ammonia. Pt states she does have some discharge.

## 2020-07-23 NOTE — Discharge Instructions (Signed)
Treating you for bacterial infection.  Sending swab for testing.  Your urine did not show any urinary tract infection or pregnancy. Follow up as needed for continued or worsening symptoms

## 2020-07-24 LAB — CERVICOVAGINAL ANCILLARY ONLY
Bacterial Vaginitis (gardnerella): POSITIVE — AB
Candida Glabrata: NEGATIVE
Candida Vaginitis: NEGATIVE
Chlamydia: NEGATIVE
Comment: NEGATIVE
Comment: NEGATIVE
Comment: NEGATIVE
Comment: NEGATIVE
Comment: NEGATIVE
Comment: NORMAL
Neisseria Gonorrhea: NEGATIVE
Trichomonas: NEGATIVE

## 2020-10-07 ENCOUNTER — Other Ambulatory Visit: Payer: Self-pay

## 2020-10-07 ENCOUNTER — Encounter: Payer: Medicare Other | Attending: Physical Medicine & Rehabilitation | Admitting: Physical Medicine & Rehabilitation

## 2020-10-07 ENCOUNTER — Encounter: Payer: Self-pay | Admitting: Physical Medicine & Rehabilitation

## 2020-10-07 VITALS — BP 115/76 | HR 102 | Temp 98.9°F | Ht 65.0 in | Wt 143.0 lb

## 2020-10-07 DIAGNOSIS — S069X0S Unspecified intracranial injury without loss of consciousness, sequela: Secondary | ICD-10-CM | POA: Diagnosis not present

## 2020-10-07 DIAGNOSIS — S06306S Unspecified focal traumatic brain injury with loss of consciousness greater than 24 hours without return to pre-existing conscious level with patient surviving, sequela: Secondary | ICD-10-CM | POA: Diagnosis not present

## 2020-10-07 DIAGNOSIS — H8113 Benign paroxysmal vertigo, bilateral: Secondary | ICD-10-CM | POA: Insufficient documentation

## 2020-10-07 DIAGNOSIS — R4689 Other symptoms and signs involving appearance and behavior: Secondary | ICD-10-CM | POA: Insufficient documentation

## 2020-10-07 DIAGNOSIS — S06309D Unspecified focal traumatic brain injury with loss of consciousness of unspecified duration, subsequent encounter: Secondary | ICD-10-CM | POA: Diagnosis not present

## 2020-10-07 DIAGNOSIS — F09 Unspecified mental disorder due to known physiological condition: Secondary | ICD-10-CM | POA: Insufficient documentation

## 2020-10-07 DIAGNOSIS — S0990XS Unspecified injury of head, sequela: Secondary | ICD-10-CM | POA: Diagnosis not present

## 2020-10-07 DIAGNOSIS — S0291XK Unspecified fracture of skull, subsequent encounter for fracture with nonunion: Secondary | ICD-10-CM | POA: Insufficient documentation

## 2020-10-07 MED ORDER — SERTRALINE HCL 50 MG PO TABS: 50.0000 mg | ORAL_TABLET | Freq: Every day | ORAL | 3 refills | Status: DC

## 2020-10-07 NOTE — Patient Instructions (Signed)
1. LET'S TRY TO REDUCE THE DAY TIME NAP TO 30-45MINUTES  2. HAVE A 9-4 SCHEDULE OF CHORES AND THINGS TO DO AT HOME  3. GO TO BED BY 10-11PM EACH NIGHT
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
## 2020-10-26 ENCOUNTER — Emergency Department (HOSPITAL_COMMUNITY)
Admission: EM | Admit: 2020-10-26 | Discharge: 2020-10-26 | Disposition: A | Discharge status: Home / Self Care | Payer: Medicare Other | Attending: Emergency Medicine | Admitting: Emergency Medicine

## 2020-10-26 ENCOUNTER — Encounter (HOSPITAL_COMMUNITY): Payer: Self-pay | Admitting: Emergency Medicine

## 2020-10-26 ENCOUNTER — Other Ambulatory Visit: Payer: Self-pay

## 2020-10-26 ENCOUNTER — Emergency Department (HOSPITAL_COMMUNITY): Payer: Medicare Other

## 2020-10-26 DIAGNOSIS — N3000 Acute cystitis without hematuria: Secondary | ICD-10-CM | POA: Diagnosis not present

## 2020-10-26 DIAGNOSIS — R519 Headache, unspecified: Secondary | ICD-10-CM | POA: Insufficient documentation

## 2020-10-26 DIAGNOSIS — R531 Weakness: Secondary | ICD-10-CM | POA: Diagnosis present

## 2020-10-26 DIAGNOSIS — M791 Myalgia, unspecified site: Secondary | ICD-10-CM | POA: Diagnosis not present

## 2020-10-26 DIAGNOSIS — R109 Unspecified abdominal pain: Secondary | ICD-10-CM | POA: Diagnosis not present

## 2020-10-26 DIAGNOSIS — Z20822 Contact with and (suspected) exposure to covid-19: Secondary | ICD-10-CM | POA: Insufficient documentation

## 2020-10-26 DIAGNOSIS — Z87891 Personal history of nicotine dependence: Secondary | ICD-10-CM | POA: Diagnosis not present

## 2020-10-26 LAB — URINALYSIS, ROUTINE W REFLEX MICROSCOPIC
Bilirubin Urine: NEGATIVE
Glucose, UA: NEGATIVE mg/dL
Hgb urine dipstick: NEGATIVE
Ketones, ur: NEGATIVE mg/dL
Nitrite: NEGATIVE
Protein, ur: NEGATIVE mg/dL
Specific Gravity, Urine: 1.025 (ref 1.005–1.030)
pH: 5 (ref 5.0–8.0)

## 2020-10-26 LAB — PREGNANCY, URINE: Preg Test, Ur: NEGATIVE

## 2020-10-26 LAB — SARS CORONAVIRUS 2 (TAT 6-24 HRS): SARS Coronavirus 2: NEGATIVE

## 2020-10-26 MED ORDER — FLUCONAZOLE 150 MG PO TABS: 150.0000 mg | ORAL_TABLET | Freq: Every day | ORAL | 0 refills | Status: AC

## 2020-10-26 MED ORDER — CEPHALEXIN 500 MG PO CAPS
500.0000 mg | ORAL_CAPSULE | Freq: Once | ORAL | Status: AC
  Administered 2020-10-26: 500 mg via ORAL
  Filled 2020-10-26: qty 1

## 2020-10-26 MED ORDER — CEPHALEXIN 500 MG PO CAPS: 500.0000 mg | ORAL_CAPSULE | Freq: Four times a day (QID) | ORAL | 0 refills | Status: AC

## 2020-10-26 MED ORDER — IBUPROFEN 400 MG PO TABS
600.0000 mg | ORAL_TABLET | Freq: Once | ORAL | Status: AC
  Administered 2020-10-26: 600 mg via ORAL
  Filled 2020-10-26: qty 1
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
## 2020-10-26 NOTE — ED Notes (Signed)
Urine obtained via clean catch

## 2020-10-26 NOTE — ED Triage Notes (Signed)
Pt states she has had some right upper back pain for a few days, sore to the touch. Pt also complains of generalized weakness since Friday.

## 2020-10-26 NOTE — ED Notes (Signed)
Lab called to add on urine culture.  ?

## 2020-10-26 NOTE — ED Notes (Signed)
Patient taken to xray.

## 2020-10-26 NOTE — Discharge Instructions (Addendum)
The Covid test is pending at time of discharge.  Instructions on how to follow this up on my chart are on your discharge paperwork, you can also call the department if you are having trouble finding these results.  If he/she is Covid positive he/she will need to be quarantine for total 5 days since the onset of symptoms +24 hours of no fever and resolving symptoms, additionally he/she needs to wear a mask near all others for 5 more days. If he/she is not Covid positive he/she is able to go back to normal day-to-day routine as long as he/she is not having fevers and it has been 24 hours since his/her last fever.  You can take 600 mg of ibuprofen every 6 hours, you can take 1000 mg of Tylenol every 6 hours, you can alternate these every 3 or you can take them together.  

## 2020-10-29 LAB — URINE CULTURE: Culture: >=100000 — AB

## 2021-03-23 ENCOUNTER — Other Ambulatory Visit: Payer: Self-pay

## 2021-03-23 ENCOUNTER — Other Ambulatory Visit (HOSPITAL_COMMUNITY)
Admission: RE | Admit: 2021-03-23 | Discharge: 2021-03-23 | Disposition: A | Discharge status: Home / Self Care | Payer: Medicare Other | Source: Ambulatory Visit | Attending: Primary Care | Admitting: Primary Care

## 2021-03-23 ENCOUNTER — Ambulatory Visit (INDEPENDENT_AMBULATORY_CARE_PROVIDER_SITE_OTHER): Payer: Medicare Other | Admitting: Primary Care

## 2021-03-23 ENCOUNTER — Encounter (INDEPENDENT_AMBULATORY_CARE_PROVIDER_SITE_OTHER): Payer: Self-pay | Admitting: Primary Care

## 2021-03-23 VITALS — BP 106/71 | HR 90 | Temp 97.5°F | Resp 16 | Wt 142.0 lb

## 2021-03-23 DIAGNOSIS — N898 Other specified noninflammatory disorders of vagina: Secondary | ICD-10-CM | POA: Diagnosis not present

## 2021-03-23 DIAGNOSIS — J301 Allergic rhinitis due to pollen: Secondary | ICD-10-CM | POA: Diagnosis not present

## 2021-03-23 MED ORDER — FLUTICASONE PROPIONATE 50 MCG/ACT NA SUSP: 2.0000 | Freq: Every day | NASAL | 6 refills | Status: DC

## 2021-03-23 MED ORDER — FEXOFENADINE-PSEUDOEPHED ER 60-120 MG PO TB12: 1.0000 | ORAL_TABLET | Freq: Two times a day (BID) | ORAL | 1 refills | Status: DC

## 2021-03-23 NOTE — Patient Instructions (Signed)
Use Flonase nasal spray for at least duration of your allergy season.  °- For appropriate administration of the nasal spray, clear the nose, use opposite hand for opposite nare, sniff gently, exhale through your mouth. °- For maximal effect take these two nasal sprays at least 30 minutes apart. °- Continue Allegra, Claritin or Zyrtec each day, as needed. °- Drink at least 64 ounces of water each day. °- If you have a humidifier use it nightly. °- Remove as many irritants/allergies as you are able to, no pets in the bedroom, change air filters in air vents.  °

## 2021-03-23 NOTE — Progress Notes (Signed)
Discuss allergies and getting medications Concerns with strong vaginal ordor

## 2021-03-23 NOTE — Progress Notes (Signed)
[facility]   Subjective:   [name] is a 30 y.o. G80P1011 female complains of an abnormal vaginal discharge for 1 week. Discharge described as: scant and malodorous. Vaginal symptoms include odor.Vulvar symptoms include none.STI Risk: Very low risk of STD exposure.   Other associated symptoms: odor.Menstrual pattern: She had been bleeding regularly. Contraception: none.  She denies vomiting, abdominal pain, fussiness, diarrhea, cough, and difficulty breathing recent antibiotic exposure, denies vomiting, abdominal pain, fussiness, diarrhea, cough, and difficulty breathing changes in soaps, detergents coinciding with the onset of her symptoms.  She has not previously self treated or been under treatment by another provider for these symptoms.  She is also having seasonal allergies congestion, watery eyes, itchy throat and rhinitis    Gynecologic History Patient's last menstrual period was 03/05/2021. Contraception: none Last Pap: 2021. Results were: normal   Obstetric History OB History  Gravida Para Term Preterm AB Living  2 1 1  0 1 1  SAB IAB Ectopic Multiple Live Births  1 0 0 0 1    # Outcome Date GA Lbr Len/2nd Weight Sex Delivery Anes PTL Lv  2 Term 12/05/12 [redacted]w[redacted]d 12:32 / 01:05 6 lb 7 oz (2.92 kg) F Vag-Spont EPI  LIV  1 SAB 2009 [redacted]w[redacted]d           Past Medical History:  Diagnosis Date   Bronchitis    rescue inhaler prn   Traumatic brain injury [facility])     Past Surgical History:  Procedure Laterality Date   NO PAST SURGERIES      Current Outpatient Medications on File Prior to Visit  Medication Sig Dispense Refill   sertraline (ZOLOFT) 50 MG tablet Take 1 tablet (50 mg total) by mouth daily. 90 tablet 3   [DISCONTINUED] traZODone (DESYREL) 50 MG tablet Take 0.5-1 tablets (25-50 mg total) by mouth at bedtime. (Patient not taking: Reported on 04/08/2020) 30 tablet 2   No current facility-administered medications on file prior to visit.    No Known  Allergies  Social History   Socioeconomic History   Marital status: Single    Spouse name: Not on file   Number of children: Not on file   Years of education: High school   Highest education level: Some college, no degree  Occupational History   Not on file  Tobacco Use   Smoking status: Former    Packs/day: 0.25    Pack years: 0.00    Types: Cigarettes    Quit date: 12/18/2014    Years since quitting: 6.2   Smokeless tobacco: Never  Vaping Use   Vaping Use: Never used  Substance and Sexual Activity   Alcohol use: No    Alcohol/week: 0.0 standard drinks   Drug use: No   Sexual activity: Yes    Birth control/protection: None  Other Topics Concern   Not on file  Social History Narrative   Not on file   Social Determinants of Health   Financial Resource Strain: Not on file  Food Insecurity: Not on file  Transportation Needs: Not on file  Physical Activity: Not on file  Stress: Not on file  Social Connections: Not on file  Intimate Partner Violence: Not on file    Family History  Problem Relation Age of Onset   Heart Problems Mother    Mental retardation Cousin    Other Neg Hx    Colon cancer Neg Hx    Esophageal cancer Neg Hx    Stomach cancer  Neg Hx    Pancreatic cancer Neg Hx    Colon polyps Neg Hx    Diabetes Neg Hx    Kidney disease Neg Hx    Liver disease Neg Hx     The following portions of the patient's history were reviewed and updated as appropriate: allergies, current medications, past family history, past medical history, past social history, past surgical history and problem list.  Review of Systems Pertinent items are noted in HPI.   Objective:  BP 106/71 (BP Location: Left Arm, Patient Position: Sitting, Cuff Size: Normal)   Pulse 90   Temp (!) 97.5 F (36.4 C)   Resp 16   Wt 142 lb (64.4 kg)   LMP 03/05/2021   SpO2 99%   BMI 23.63 kg/m  CONSTITUTIONAL: Well-developed, well-nourished female in no acute distress.  HENT:   Normocephalic, atraumatic, External right and left ear normal.  EYES: Conjunctivae and EOM are normal. Pupils are equal, round, and reactive to light. No scleral icterus.  NECK: Normal range of motion, supple, no masses.  Normal thyroid.  SKIN: Skin is warm and dry. No rash noted. Not diaphoretic. No erythema. No pallor. NEUROLGIC: Alert and oriented to person, place, and time. Normal reflexes, muscle tone coordination. No cranial nerve deficit noted. PSYCHIATRIC: Normal mood and affect. Normal behavior. Normal judgment and thought content. CARDIOVASCULAR: Normal heart rate noted, regular rhythm RESPIRATORY: Clear to auscultation bilaterally. Effort and breath sounds normal, no problems with respiration noted. ABDOMEN: Soft, normal bowel sounds, no distention noted.  No tenderness, rebound or guarding.  MUSCULOSKELETAL: Normal range of motion. No tenderness.  No cyanosis, clubbing, or edema.  2+ distal pulses.   Assessment:  Avanti was seen today for medication management and allergies.  Diagnoses and all orders for this visit:  Vaginal discharge -     Cervicovaginal ancillary only  Seasonal allergic rhinitis due to pollen -     fexofenadine-pseudoephedrine (ALLEGRA-D) 60-120 MG 12 hr tablet; Take 1 tablet by mouth 2 (two) times daily. -     fluticasone (FLONASE) 50 MCG/ACT nasal spray; Place 2 sprays into both nostrils daily. More information provided on AVS    Patient verbalized understanding. After Visit Summary given.   Plan:   Will follow up on all labs ordered today.  Patient counseled regarding condom use with each sexual activity to promote wellness and prevention of transmission of HIV, syphilis, herpes simplex virus, gonorrhea, chlamydia and trichomoniasis.   This note has been created with Education officer, environmental. Any transcriptional errors are unintentional.   Grayce Sessions, NP 03/23/2021, 10:29 AM

## 2021-03-24 ENCOUNTER — Other Ambulatory Visit (INDEPENDENT_AMBULATORY_CARE_PROVIDER_SITE_OTHER): Payer: Self-pay | Admitting: Primary Care

## 2021-03-24 DIAGNOSIS — N760 Acute vaginitis: Secondary | ICD-10-CM

## 2021-03-24 DIAGNOSIS — B379 Candidiasis, unspecified: Secondary | ICD-10-CM

## 2021-03-24 LAB — CERVICOVAGINAL ANCILLARY ONLY
Bacterial Vaginitis (gardnerella): POSITIVE — AB
Candida Glabrata: NEGATIVE
Candida Vaginitis: POSITIVE — AB
Chlamydia: POSITIVE — AB
Comment: NEGATIVE
Comment: NEGATIVE
Comment: NEGATIVE
Comment: NEGATIVE
Comment: NEGATIVE
Comment: NORMAL
Neisseria Gonorrhea: NEGATIVE
Trichomonas: NEGATIVE

## 2021-03-24 MED ORDER — FLUCONAZOLE 150 MG PO TABS: 150.0000 mg | ORAL_TABLET | Freq: Every day | ORAL | 0 refills | Status: DC

## 2021-03-24 MED ORDER — METRONIDAZOLE 500 MG PO TABS: 500.0000 mg | ORAL_TABLET | Freq: Two times a day (BID) | ORAL | 0 refills | Status: DC

## 2021-03-24 NOTE — Progress Notes (Signed)
Tx course azithromycin 1gm single dose when patient comes in .

## 2021-04-06 ENCOUNTER — Ambulatory Visit (INDEPENDENT_AMBULATORY_CARE_PROVIDER_SITE_OTHER): Payer: Medicare Other | Admitting: Primary Care

## 2021-04-07 ENCOUNTER — Encounter: Payer: Medicare Other | Admitting: Physical Medicine & Rehabilitation

## 2021-04-15 ENCOUNTER — Ambulatory Visit (INDEPENDENT_AMBULATORY_CARE_PROVIDER_SITE_OTHER): Payer: Medicare Other | Admitting: Primary Care

## 2021-04-15 ENCOUNTER — Other Ambulatory Visit: Payer: Self-pay

## 2021-04-15 ENCOUNTER — Other Ambulatory Visit (INDEPENDENT_AMBULATORY_CARE_PROVIDER_SITE_OTHER): Payer: Self-pay | Admitting: Primary Care

## 2021-04-15 ENCOUNTER — Encounter (INDEPENDENT_AMBULATORY_CARE_PROVIDER_SITE_OTHER): Payer: Self-pay | Admitting: Primary Care

## 2021-04-15 ENCOUNTER — Other Ambulatory Visit (HOSPITAL_COMMUNITY)
Admission: RE | Admit: 2021-04-15 | Discharge: 2021-04-15 | Disposition: A | Discharge status: Home / Self Care | Payer: Medicare Other | Source: Ambulatory Visit | Attending: Primary Care | Admitting: Primary Care

## 2021-04-15 VITALS — BP 115/80 | HR 99 | Temp 97.3°F | Ht 65.0 in | Wt 142.8 lb

## 2021-04-15 DIAGNOSIS — Z0001 Encounter for general adult medical examination with abnormal findings: Secondary | ICD-10-CM | POA: Diagnosis not present

## 2021-04-15 DIAGNOSIS — N898 Other specified noninflammatory disorders of vagina: Secondary | ICD-10-CM | POA: Insufficient documentation

## 2021-04-15 DIAGNOSIS — Z0000 Encounter for general adult medical examination without abnormal findings: Secondary | ICD-10-CM | POA: Diagnosis not present

## 2021-04-15 DIAGNOSIS — N926 Irregular menstruation, unspecified: Secondary | ICD-10-CM

## 2021-04-15 NOTE — Progress Notes (Signed)
me

## 2021-04-15 NOTE — Patient Instructions (Signed)
Health Maintenance, Female Adopting a healthy lifestyle and getting preventive care are important in promoting health and wellness. Ask your health care provider about: The right schedule for you to have regular tests and exams. Things you can do on your own to prevent diseases and keep yourself healthy. What should I know about diet, weight, and exercise? Eat a healthy diet  Eat a diet that includes plenty of vegetables, fruits, low-fat dairy products, and lean protein. Do not eat a lot of foods that are high in solid fats, added sugars, or sodium.  Maintain a healthy weight Body mass index (BMI) is used to identify weight problems. It estimates body fat based on height and weight. Your health care provider can help determineyour BMI and help you achieve or maintain a healthy weight. Get regular exercise Get regular exercise. This is one of the most important things you can do for your health. Most adults should: Exercise for at least 150 minutes each week. The exercise should increase your heart rate and make you sweat (moderate-intensity exercise). Do strengthening exercises at least twice a week. This is in addition to the moderate-intensity exercise. Spend less time sitting. Even light physical activity can be beneficial. Watch cholesterol and blood lipids Have your blood tested for lipids and cholesterol at 31 years of age, then havethis test every 5 years. Have your cholesterol levels checked more often if: Your lipid or cholesterol levels are high. You are older than 31 years of age. You are at high risk for heart disease. What should I know about cancer screening? Depending on your health history and family history, you may need to have cancer screening at various ages. This may include screening for: Breast cancer. Cervical cancer. Colorectal cancer. Skin cancer. Lung cancer. What should I know about heart disease, diabetes, and high blood pressure? Blood pressure and heart  disease High blood pressure causes heart disease and increases the risk of stroke. This is more likely to develop in people who have high blood pressure readings, are of African descent, or are overweight. Have your blood pressure checked: Every 3-5 years if you are 18-39 years of age. Every year if you are 40 years old or older. Diabetes Have regular diabetes screenings. This checks your fasting blood sugar level. Have the screening done: Once every three years after age 40 if you are at a normal weight and have a low risk for diabetes. More often and at a younger age if you are overweight or have a high risk for diabetes. What should I know about preventing infection? Hepatitis B If you have a higher risk for hepatitis B, you should be screened for this virus. Talk with your health care provider to find out if you are at risk forhepatitis B infection. Hepatitis C Testing is recommended for: Everyone born from 1945 through 1965. Anyone with known risk factors for hepatitis C. Sexually transmitted infections (STIs) Get screened for STIs, including gonorrhea and chlamydia, if: You are sexually active and are younger than 31 years of age. You are older than 31 years of age and your health care provider tells you that you are at risk for this type of infection. Your sexual activity has changed since you were last screened, and you are at increased risk for chlamydia or gonorrhea. Ask your health care provider if you are at risk. Ask your health care provider about whether you are at high risk for HIV. Your health care provider may recommend a prescription medicine to help   prevent HIV infection. If you choose to take medicine to prevent HIV, you should first get tested for HIV. You should then be tested every 3 months for as long as you are taking the medicine. Pregnancy If you are about to stop having your period (premenopausal) and you may become pregnant, seek counseling before you get  pregnant. Take 400 to 800 micrograms (mcg) of folic acid every day if you become pregnant. Ask for birth control (contraception) if you want to prevent pregnancy. Osteoporosis and menopause Osteoporosis is a disease in which the bones lose minerals and strength with aging. This can result in bone fractures. If you are 65 years old or older, or if you are at risk for osteoporosis and fractures, ask your health care provider if you should: Be screened for bone loss. Take a calcium or vitamin D supplement to lower your risk of fractures. Be given hormone replacement therapy (HRT) to treat symptoms of menopause. Follow these instructions at home: Lifestyle Do not use any products that contain nicotine or tobacco, such as cigarettes, e-cigarettes, and chewing tobacco. If you need help quitting, ask your health care provider. Do not use street drugs. Do not share needles. Ask your health care provider for help if you need support or information about quitting drugs. Alcohol use Do not drink alcohol if: Your health care provider tells you not to drink. You are pregnant, may be pregnant, or are planning to become pregnant. If you drink alcohol: Limit how much you use to 0-1 drink a day. Limit intake if you are breastfeeding. Be aware of how much alcohol is in your drink. In the [place] one drink equals one 12 oz bottle of beer (355 mL), one 5 oz glass of wine (148 mL), or one 1 oz glass of hard liquor (44 mL). General instructions Schedule regular health, dental, and eye exams. Stay current with your vaccines. Tell your health care provider if: You often feel depressed. You have ever been abused or do not feel safe at home. Summary Adopting a healthy lifestyle and getting preventive care are important in promoting health and wellness. Follow your health care provider's instructions about healthy diet, exercising, and getting tested or screened for diseases. Follow your health care provider's  instructions on monitoring your cholesterol and blood pressure. This information is not intended to replace advice given to you by your health care provider. Make sure you discuss any questions you have with your healthcare provider. Document Revised: 09/05/2018 Document Reviewed: 09/05/2018 Elsevier Patient Education  2022 [employer]  

## 2021-04-15 NOTE — Progress Notes (Signed)
Leah Rojas is a 30 y.o. female presents to office today for annual physical exam examination.    Concerns today include: 1. Vaginal discharge   Occupation: disability , Marital status: s, Substance use: no Diet: healthy , Exercise: walking  Last eye exam: 2021 Last dental exam: 2022 (March)  Last pap smear: 04/07/20 Refills needed today: Yes Immunizations needed: Flu Vaccine: no  Tdap Vaccine: no  - every 65yrs - (<3 lifetime doses or unknown): all wounds -- look up need for Tetanus IG - (>=3 lifetime doses): clean/minor wound if >40yrs from previous; all other wounds if >30yrs from previous Zoster Vaccine: no (those >50yo, once) Pneumonia Vaccine: no (those w/ risk factors) - (<50yr) Both: Immunocompromised, cochlear implant, CSF leak, asplenic, sickle cell, Chronic Renal Failure - (<61yr) PPSV-23 only: Heart dz, lung disease, DM, tobacco abuse, alcoholism, cirrhosis/liver disease. - (>91yr): PPSV13 then PPSV23 in 6-12mths;  - (>20yr): repeat PPSV23 once if pt received prior to 30yo and 77yrs have passed  Past Medical History:  Diagnosis Date   Bronchitis    rescue inhaler prn   Traumatic brain injury [facility])    Social History   Socioeconomic History   Marital status: Single    Spouse name: Not on file   Number of children: Not on file   Years of education: High school   Highest education level: Some college, no degree  Occupational History   Not on file  Tobacco Use   Smoking status: Former    Packs/day: 0.25    Types: Cigarettes    Quit date: 12/18/2014    Years since quitting: 6.3   Smokeless tobacco: Never  Vaping Use   Vaping Use: Never used  Substance and Sexual Activity   Alcohol use: No    Alcohol/week: 0.0 standard drinks   Drug use: No   Sexual activity: Yes    Birth control/protection: None  Other Topics Concern   Not on file  Social History Narrative   Not on file   Social Determinants of Health   Financial Resource Strain: Not on file   Food Insecurity: Not on file  Transportation Needs: Not on file  Physical Activity: Not on file  Stress: Not on file  Social Connections: Not on file  Intimate Partner Violence: Not on file   Past Surgical History:  Procedure Laterality Date   NO PAST SURGERIES     Family History  Problem Relation Age of Onset   Heart Problems Mother    Mental retardation Cousin    Other Neg Hx    Colon cancer Neg Hx    Esophageal cancer Neg Hx    Stomach cancer Neg Hx    Pancreatic cancer Neg Hx    Colon polyps Neg Hx    Diabetes Neg Hx    Kidney disease Neg Hx    Liver disease Neg Hx     Current Outpatient Medications:    fexofenadine-pseudoephedrine (ALLEGRA-D) 60-120 MG 12 hr tablet, Take 1 tablet by mouth 2 (two) times daily., Disp: 60 tablet, Rfl: 1   fluticasone (FLONASE) 50 MCG/ACT nasal spray, Place 2 sprays into both nostrils daily., Disp: 16 g, Rfl: 6   sertraline (ZOLOFT) 25 MG tablet, Take 25 mg by mouth daily., Disp: , Rfl:    sertraline (ZOLOFT) 50 MG tablet, Take 1 tablet (50 mg total) by mouth daily., Disp: 90 tablet, Rfl: 3  No Known Allergies   ROS: Review of Systems Pertinent items noted in HPI and remainder of comprehensive ROS  otherwise negative.    Physical exam BP 115/80 (BP Location: Right Arm, Patient Position: Sitting, Cuff Size: Normal)   Pulse 99   Temp (!) 97.3 F (36.3 C) (Temporal)   Ht 5\' 5"  (1.651 m)   Wt 142 lb 12.8 oz (64.8 kg)   LMP 03/31/2021 (Exact Date)   SpO2 97%   BMI 23.76 kg/m  General appearance: alert, cooperative, and appears older than stated age Head: Normocephalic, without obvious abnormality, atraumatic Eyes: conjunctivae/corneas clear. PERRL, EOM's intact. Fundi benign. Ears: normal TM's and external ear canals both ears Nose: Nares normal. Septum midline. Mucosa normal. No drainage or sinus tenderness. Throat: lips, mucosa, and tongue normal; teeth and gums normal Neck: no adenopathy, no carotid bruit, no JVD, supple,  symmetrical, trachea midline, and thyroid not enlarged, symmetric, no tenderness/mass/nodules Back: symmetric, no curvature. ROM normal. No CVA tenderness. Lungs: clear to auscultation bilaterally Heart: regular rate and rhythm, S1, S2 normal, no murmur, click, rub or gallop Abdomen: soft, non-tender; bowel sounds normal; no masses,  no organomegaly Extremities: extremities normal, atraumatic, no cyanosis or edema Pulses: 2+ and symmetric Skin: Skin color, texture, turgor normal. No rashes or lesions   Assessment/ Plan: 06/01/2021 here for annual physical exam.  Leah Rojas was seen today for annual exam.  Diagnoses and all orders for this visit:  Encounter for annual wellness visit (AWV) in Medicare patient Completed   Vaginal discharge Ancillary    Menstrual irregularity CBC  No problem-specific Assessment & Plan notes found for this encounter.   Counseled on healthy lifestyle choices, including diet (rich in fruits, vegetables and lean meats and low in salt and simple carbohydrates) and exercise (at least 30 minutes of moderate physical activity daily).  Patient to follow up in 1 year for annual exam or sooner if needed.  The above assessment and management plan was discussed with the patient. The patient verbalized understanding of and has agreed to the management plan. Patient is aware to call the clinic if symptoms persist or worsen. Patient is aware when to return to the clinic for a follow-up visit. Patient educated on when it is appropriate to go to the emergency department.   [name] NP-C [address] [place] [phone]

## 2021-04-16 LAB — CMP14+EGFR
ALT: 12 IU/L (ref 0–32)
AST: 14 IU/L (ref 0–40)
Albumin/Globulin Ratio: 1.8 (ref 1.2–2.2)
Albumin: 4.6 g/dL (ref 3.8–4.8)
Alkaline Phosphatase: 50 IU/L (ref 44–121)
BUN/Creatinine Ratio: 35 — ABNORMAL HIGH (ref 9–23)
BUN: 17 mg/dL (ref 6–20)
Bilirubin Total: 0.5 mg/dL (ref 0.0–1.2)
CO2: 24 mmol/L (ref 20–29)
Calcium: 9.2 mg/dL (ref 8.7–10.2)
Chloride: 105 mmol/L (ref 96–106)
Creatinine, Ser: 0.48 mg/dL — ABNORMAL LOW (ref 0.57–1.00)
Globulin, Total: 2.5 g/dL (ref 1.5–4.5)
Glucose: 80 mg/dL (ref 65–99)
Potassium: 4.5 mmol/L (ref 3.5–5.2)
Sodium: 143 mmol/L (ref 134–144)
Total Protein: 7.1 g/dL (ref 6.0–8.5)
eGFR: 130 mL/min/{1.73_m2} (ref >59)

## 2021-04-16 LAB — CBC WITH DIFFERENTIAL/PLATELET
Basophils Absolute: 0 10*3/uL (ref 0.0–0.2)
Basos: 1 %
EOS (ABSOLUTE): 0.1 10*3/uL (ref 0.0–0.4)
Eos: 2 %
Hematocrit: 36.5 % (ref 34.0–46.6)
Hemoglobin: 12.1 g/dL (ref 11.1–15.9)
Immature Grans (Abs): 0 10*3/uL (ref 0.0–0.1)
Immature Granulocytes: 0 %
Lymphocytes Absolute: 1.3 10*3/uL (ref 0.7–3.1)
Lymphs: 19 %
MCH: 32.4 pg (ref 26.6–33.0)
MCHC: 33.2 g/dL (ref 31.5–35.7)
MCV: 98 fL — ABNORMAL HIGH (ref 79–97)
Monocytes Absolute: 0.6 10*3/uL (ref 0.1–0.9)
Monocytes: 9 %
Neutrophils Absolute: 5 10*3/uL (ref 1.4–7.0)
Neutrophils: 69 %
Platelets: 311 10*3/uL (ref 150–450)
RBC: 3.74 x10E6/uL — ABNORMAL LOW (ref 3.77–5.28)
RDW: 13.2 % (ref 11.7–15.4)
WBC: 7.1 10*3/uL (ref 3.4–10.8)

## 2021-04-20 LAB — CERVICOVAGINAL ANCILLARY ONLY
Bacterial Vaginitis (gardnerella): NEGATIVE
Candida Glabrata: NEGATIVE
Candida Vaginitis: NEGATIVE
Chlamydia: POSITIVE — AB
Comment: NEGATIVE
Comment: NEGATIVE
Comment: NEGATIVE
Comment: NEGATIVE
Comment: NEGATIVE
Comment: NORMAL
Neisseria Gonorrhea: NEGATIVE
Trichomonas: NEGATIVE

## 2021-04-21 ENCOUNTER — Other Ambulatory Visit (INDEPENDENT_AMBULATORY_CARE_PROVIDER_SITE_OTHER): Payer: Self-pay | Admitting: Primary Care

## 2021-04-21 ENCOUNTER — Encounter (INDEPENDENT_AMBULATORY_CARE_PROVIDER_SITE_OTHER): Payer: Self-pay | Admitting: Primary Care

## 2021-04-21 DIAGNOSIS — A749 Chlamydial infection, unspecified: Secondary | ICD-10-CM | POA: Insufficient documentation

## 2021-04-21 MED ORDER — DOXYCYCLINE HYCLATE 100 MG PO TABS: 100.0000 mg | ORAL_TABLET | Freq: Two times a day (BID) | ORAL | 0 refills | Status: DC

## 2021-06-23 ENCOUNTER — Encounter: Payer: Self-pay | Admitting: Physical Medicine & Rehabilitation

## 2021-06-23 ENCOUNTER — Other Ambulatory Visit: Payer: Self-pay

## 2021-06-23 ENCOUNTER — Encounter: Payer: Medicare Other | Attending: Physical Medicine & Rehabilitation | Admitting: Physical Medicine & Rehabilitation

## 2021-06-23 VITALS — BP 111/72 | HR 78 | Temp 97.9°F | Ht 65.0 in | Wt 151.0 lb

## 2021-06-23 DIAGNOSIS — S06306S Unspecified focal traumatic brain injury with loss of consciousness greater than 24 hours without return to pre-existing conscious level with patient surviving, sequela: Secondary | ICD-10-CM | POA: Insufficient documentation

## 2021-06-23 DIAGNOSIS — S069X0S Unspecified intracranial injury without loss of consciousness, sequela: Secondary | ICD-10-CM | POA: Insufficient documentation

## 2021-06-23 DIAGNOSIS — R4689 Other symptoms and signs involving appearance and behavior: Secondary | ICD-10-CM | POA: Insufficient documentation

## 2021-06-23 NOTE — Patient Instructions (Addendum)
BE AWARE OF WHEN YOU'RE OVERDOING IT. TAKE A BREAK AND GET SOME FRESH AIR, GET AWAY FROM THE THING THAT'S STRESSING.   "MINDFUL" APP    YOU MAY TAKE UP TO 2500MG  OF TYLENOL PER DAY IN 4 DIVIDED DOSES  IF TYLENOL DOESN'T WORK, YOU CAN TRY IBUPROFEN 400-600MG  EVERY 8 HOURS AS NEEDED
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
## 2021-07-14 ENCOUNTER — Ambulatory Visit (INDEPENDENT_AMBULATORY_CARE_PROVIDER_SITE_OTHER): Payer: Self-pay | Admitting: *Deleted

## 2021-07-14 NOTE — Telephone Encounter (Signed)
Summary: Clinical Advice   Patient states fexofenadine-pseudoephedrine (ALLEGRA-D) 60-120 MG 12 hr tablet  is not working for her watery eyes and runny nose. Patient seeking clinical advice and a referral to allergist.      Reason for Disposition  [1] Taking antihistamines > 2 days AND [2] nasal allergy symptoms interfere with sleep, school, or work  Answer Assessment - Initial Assessment Questions 1. SYMPTOM: "What's the main symptom you're concerned about?" (e.g., runny nose, stuffiness, sneezing, itching)     Runny nose, congestion, watery eyes, sneezing 2. SEVERITY: "How bad is it?" "What does it keep you from doing?" (e.g., sleeping, working)      Continuous symptoms 3. EYES: "Are the eyes also red, watery, and itchy?"      Watery, red, itchy 4. TRIGGER: "What pollen or other allergic substance do you think is causing the symptoms?"      leaves 5. TREATMENT: "What medicine are you using?" "What medicine worked best in the past?"     Allegra, Zyrtec, Claritin 6. OTHER SYMPTOMS: "Do you have any other symptoms?" (e.g., coughing, difficulty breathing, wheezing)     coughing 7. PREGNANCY: "Is there any chance you are pregnant?" "When was your last menstrual period?"     No- LMP- last month  Protocols used: Nasal Allergies (Hay Fever)-A-AH

## 2021-07-14 NOTE — Telephone Encounter (Signed)
Patient is requesting a referral to allergist- she states he has tried every OTC medication there is and has not been able to find a medication that helps her symptoms. Requesting PCP do referral for her.

## 2021-07-20 ENCOUNTER — Ambulatory Visit (INDEPENDENT_AMBULATORY_CARE_PROVIDER_SITE_OTHER): Payer: Medicare Other | Admitting: Primary Care

## 2021-07-20 ENCOUNTER — Other Ambulatory Visit: Payer: Self-pay

## 2021-07-20 ENCOUNTER — Encounter (INDEPENDENT_AMBULATORY_CARE_PROVIDER_SITE_OTHER): Payer: Self-pay | Admitting: Primary Care

## 2021-07-20 VITALS — BP 117/74 | HR 91 | Temp 97.5°F | Resp 16 | Ht 64.0 in | Wt 150.0 lb

## 2021-07-20 DIAGNOSIS — J309 Allergic rhinitis, unspecified: Secondary | ICD-10-CM

## 2021-07-20 NOTE — Progress Notes (Signed)
  [facility]      HPI [name] is a 30 y.o.female presents for reoccurring allergy problems.  Previously tried Afrin spray for 3 days, Allegra-D daily, Flonase as needed with no relief.  Today she is still miserable with her sinuses and requesting to be referred to a ears nose and throat doctor.  She also has had congestion for over a month it is nonproductive and clear sputum.  He voices no other complaints, problems or concerns. .  Past Medical History:  Diagnosis Date   Bronchitis    rescue inhaler prn   Traumatic brain injury      No Known Allergies    Current Outpatient Medications on File Prior to Visit  Medication Sig Dispense Refill   sertraline (ZOLOFT) 25 MG tablet Take 25 mg by mouth daily.     sertraline (ZOLOFT) 50 MG tablet Take 1 tablet (50 mg total) by mouth daily. 90 tablet 3   [DISCONTINUED] traZODone (DESYREL) 50 MG tablet Take 0.5-1 tablets (25-50 mg total) by mouth at bedtime. (Patient not taking: Reported on 04/08/2020) 30 tablet 2   No current facility-administered medications on file prior to visit.    ROS: all negative except above.   Physical Exam: Filed Weights   07/20/21 0951  Weight: 150 lb (68 kg)   BP 117/74 (BP Location: Left Arm, Patient Position: Sitting, Cuff Size: Normal)   Pulse 91   Temp (!) 97.5 F (36.4 C)   Resp 16   Ht 5\' 4"  (1.626 m)   Wt 150 lb (68 kg)   LMP 07/08/2021   SpO2 99%   BMI 25.75 kg/m  General Appearance: Well nourished, in no apparent distress. Eyes: PERRLA, EOMs, conjunctiva no swelling or erythema Sinuses: No Frontal/maxillary tenderness ENT/Mouth: Ext aud canals clear, TMs without erythema, bulging. No erythema, swelling, or exudate on post pharynx.  Tonsils not swollen or erythematous. Hearing normal.  Neck: Supple, thyroid normal.  Respiratory: Respiratory effort normal, BS equal bilaterally without rales, rhonchi, wheezing or stridor.  Cardio: RRR with no MRGs. Brisk peripheral  pulses without edema.  Abdomen: Soft, + BS.  Non tender, no guarding, rebound, hernias, masses. Lymphatics: Non tender without lymphadenopathy.  Musculoskeletal: Full ROM, 5/5 strength, normal gait.  Skin: Warm, dry without rashes, lesions, ecchymosis.  Neuro: Cranial nerves intact. Normal muscle tone, no cerebellar symptoms. Sensation intact.  Psych: Awake and oriented X 3, normal affect, Insight and Judgment appropriate.   . Diagnoses and all orders for this visit:  Chronic allergic rhinitis -     Ambulatory referral to ENT   07/10/2021, NP 9:58 AM

## 2021-07-20 NOTE — Progress Notes (Signed)
Request referral to allergist No relief with taking Rxed and OTC medications  Ongoing since September

## 2021-07-20 NOTE — Patient Instructions (Signed)
Use Flonase nasal spray for at least duration of your allergy season.  °- For appropriate administration of the nasal spray, clear the nose, use opposite hand for opposite nare, sniff gently, exhale through your mouth. °- For maximal effect take these two nasal sprays at least 30 minutes apart. °- Continue Allegra, Claritin or Zyrtec each day, as needed. °- Drink at least 64 ounces of water each day. °- If you have a humidifier use it nightly. °- Remove as many irritants/allergies as you are able to, no pets in the bedroom, change air filters in air vents.  °

## 2021-07-26 ENCOUNTER — Other Ambulatory Visit: Payer: Self-pay

## 2021-07-26 ENCOUNTER — Emergency Department (HOSPITAL_COMMUNITY)
Admission: EM | Admit: 2021-07-26 | Discharge: 2021-07-27 | Disposition: A | Discharge status: Home / Self Care | Payer: Medicare Other | Attending: Emergency Medicine | Admitting: Emergency Medicine

## 2021-07-26 ENCOUNTER — Encounter (HOSPITAL_COMMUNITY): Payer: Self-pay

## 2021-07-26 DIAGNOSIS — R109 Unspecified abdominal pain: Secondary | ICD-10-CM | POA: Insufficient documentation

## 2021-07-26 DIAGNOSIS — R531 Weakness: Secondary | ICD-10-CM | POA: Diagnosis not present

## 2021-07-26 DIAGNOSIS — U071 COVID-19: Secondary | ICD-10-CM | POA: Insufficient documentation

## 2021-07-26 DIAGNOSIS — R1110 Vomiting, unspecified: Secondary | ICD-10-CM | POA: Diagnosis not present

## 2021-07-26 DIAGNOSIS — R059 Cough, unspecified: Secondary | ICD-10-CM | POA: Diagnosis present

## 2021-07-26 DIAGNOSIS — R0789 Other chest pain: Secondary | ICD-10-CM | POA: Diagnosis not present

## 2021-07-26 DIAGNOSIS — Z5321 Procedure and treatment not carried out due to patient leaving prior to being seen by health care provider: Secondary | ICD-10-CM | POA: Diagnosis not present

## 2021-07-26 LAB — CBC WITH DIFFERENTIAL/PLATELET
Abs Immature Granulocytes: 0.01 10*3/uL (ref 0.00–0.07)
Basophils Absolute: 0 10*3/uL (ref 0.0–0.1)
Basophils Relative: 1 %
Eosinophils Absolute: 0 10*3/uL (ref 0.0–0.5)
Eosinophils Relative: 1 %
HCT: 36.3 % (ref 36.0–46.0)
Hemoglobin: 12.3 g/dL (ref 12.0–15.0)
Immature Granulocytes: 0 %
Lymphocytes Relative: 41 %
Lymphs Abs: 2 10*3/uL (ref 0.7–4.0)
MCH: 32.1 pg (ref 26.0–34.0)
MCHC: 33.9 g/dL (ref 30.0–36.0)
MCV: 94.8 fL (ref 80.0–100.0)
Monocytes Absolute: 0.7 10*3/uL (ref 0.1–1.0)
Monocytes Relative: 13 %
Neutro Abs: 2.2 10*3/uL (ref 1.7–7.7)
Neutrophils Relative %: 44 %
Platelets: 309 10*3/uL (ref 150–400)
RBC: 3.83 MIL/uL — ABNORMAL LOW (ref 3.87–5.11)
RDW: 13.3 % (ref 11.5–15.5)
WBC: 4.9 10*3/uL (ref 4.0–10.5)
nRBC: 0 % (ref 0.0–0.2)

## 2021-07-26 LAB — RESP PANEL BY RT-PCR (FLU A&B, COVID) ARPGX2
Influenza A by PCR: NEGATIVE
Influenza B by PCR: NEGATIVE
SARS Coronavirus 2 by RT PCR: POSITIVE — AB

## 2021-07-26 LAB — URINALYSIS, ROUTINE W REFLEX MICROSCOPIC
Bilirubin Urine: NEGATIVE
Glucose, UA: NEGATIVE mg/dL
Hgb urine dipstick: NEGATIVE
Ketones, ur: 20 mg/dL — AB
Nitrite: NEGATIVE
Protein, ur: 30 mg/dL — AB
Specific Gravity, Urine: 1.026 (ref 1.005–1.030)
pH: 5 (ref 5.0–8.0)

## 2021-07-26 LAB — BASIC METABOLIC PANEL
Anion gap: 7 (ref 5–15)
BUN: 15 mg/dL (ref 6–20)
CO2: 25 mmol/L (ref 22–32)
Calcium: 8.6 mg/dL — ABNORMAL LOW (ref 8.9–10.3)
Chloride: 105 mmol/L (ref 98–111)
Creatinine, Ser: 0.62 mg/dL (ref 0.44–1.00)
GFR, Estimated: >60 mL/min (ref >60)
Glucose, Bld: 88 mg/dL (ref 70–99)
Potassium: 3.5 mmol/L (ref 3.5–5.1)
Sodium: 137 mmol/L (ref 135–145)
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
## 2021-07-26 NOTE — ED Provider Notes (Signed)
Emergency Medicine Provider Triage Evaluation Note  Leah Rojas , a 30 y.o. female  was evaluated in triage.  Pt complains of multiple symptoms.  She states that she is "always sick", and has had several symptoms for the past month that have worsened since Saturday.  She has been trying TheraFlu, Tylenol, NyQuil, and Benadryl with minimal relief.  She also reports intermittent chest pain, no aggravating or alleviating factors.  Review of Systems  Positive: Fevers, chills, abdominal pain, chest pain, nasal congestion, rhinorrhea, body aches, vomiting, weakness, constipation, dysuria Negative: Shortness of breath, diarrhea  Physical Exam  BP 115/80 (BP Location: Left Arm)   Pulse 86   Temp 98.6 F (37 C) (Oral)   Resp 16   Ht 5\' 4"  (1.626 m)   Wt 68 kg   LMP 07/20/2021 (Exact Date)   SpO2 98%   BMI 25.75 kg/m  Gen:   Awake, no distress   Resp:  Normal effort  MSK:   Moves extremities without difficulty  Other:    Medical Decision Making  Medically screening exam initiated at 9:40 PM.  Appropriate orders placed.  07/22/2021 was informed that the remainder of the evaluation will be completed by another provider, this initial triage assessment does not replace that evaluation, and the importance of remaining in the ED until their evaluation is complete.     Sharyn Blitz 07/26/21 2142    [id number], MD 07/27/21 1520

## 2021-07-27 NOTE — ED Notes (Signed)
Gave pt snack and something to drink. Then 5 mins later pt stated that she is leaving and gave me her stickers.

## 2021-07-28 ENCOUNTER — Encounter (INDEPENDENT_AMBULATORY_CARE_PROVIDER_SITE_OTHER): Payer: Medicare Other | Admitting: Primary Care

## 2021-08-03 ENCOUNTER — Telehealth (INDEPENDENT_AMBULATORY_CARE_PROVIDER_SITE_OTHER): Payer: Self-pay

## 2021-08-03 NOTE — Telephone Encounter (Signed)
Copied from CRM [id number]. Topic: Referral - Request for Referral >> Aug 03, 2021 12:37 PM Gwenlyn Fudge wrote: Has patient seen PCP for this complaint? Yes.   *If NO, is insurance requiring patient see PCP for this issue before PCP can refer them? Referral for which specialty: Allergist Preferred provider/office: N/A Reason for referral: Pt states that she is needing to see someone to get her allergies treated. Provider she was recently referred to has retired. Please advise.  Needs new referral placed as Dr. Ezzard Standing has retired. Maryjean Morn, CMA

## 2021-08-04 ENCOUNTER — Other Ambulatory Visit (INDEPENDENT_AMBULATORY_CARE_PROVIDER_SITE_OTHER): Payer: Self-pay | Admitting: Primary Care

## 2021-08-04 DIAGNOSIS — J309 Allergic rhinitis, unspecified: Secondary | ICD-10-CM

## 2021-08-04 NOTE — Telephone Encounter (Signed)
Sent new referral.

## 2021-08-17 ENCOUNTER — Other Ambulatory Visit: Payer: Self-pay | Admitting: Physical Medicine & Rehabilitation

## 2021-08-17 DIAGNOSIS — J329 Chronic sinusitis, unspecified: Secondary | ICD-10-CM | POA: Diagnosis not present

## 2021-08-17 DIAGNOSIS — B9689 Other specified bacterial agents as the cause of diseases classified elsewhere: Secondary | ICD-10-CM | POA: Diagnosis not present

## 2021-08-17 DIAGNOSIS — Z9189 Other specified personal risk factors, not elsewhere classified: Secondary | ICD-10-CM | POA: Diagnosis not present

## 2021-08-17 DIAGNOSIS — Z1152 Encounter for screening for COVID-19: Secondary | ICD-10-CM | POA: Diagnosis not present

## 2021-08-24 ENCOUNTER — Other Ambulatory Visit (INDEPENDENT_AMBULATORY_CARE_PROVIDER_SITE_OTHER): Payer: Self-pay | Admitting: Primary Care

## 2021-08-24 ENCOUNTER — Ambulatory Visit (INDEPENDENT_AMBULATORY_CARE_PROVIDER_SITE_OTHER): Payer: Self-pay

## 2021-08-24 ENCOUNTER — Telehealth (INDEPENDENT_AMBULATORY_CARE_PROVIDER_SITE_OTHER): Payer: Self-pay

## 2021-08-24 DIAGNOSIS — J309 Allergic rhinitis, unspecified: Secondary | ICD-10-CM

## 2021-08-24 NOTE — Telephone Encounter (Signed)
Stingy when cough and wheezing and dry cough that started last night. Can hear pt wheezing over the phone. Sx onset last night. Denies SOB or chest pain or chest pain that radiates to left arm, neck, jaw or back. Care advice given and pt advised to go to ED or UC.  Pt verbalized understanding. Reason for Disposition  Wheezing can be heard across the room  Answer Assessment - Initial Assessment Questions 1. RESPIRATORY STATUS: "Describe your breathing?" (e.g., wheezing, shortness of breath, unable to speak, severe coughing)      wheezing 2. ONSET: "When did this breathing problem begin?"      Last  3. PATTERN "Does the difficult breathing come and go, or has it been constant since it started?"      constant 4. SEVERITY: "How bad is your breathing?" (e.g., mild, moderate, severe)    - MILD: No SOB at rest, mild SOB with walking, speaks normally in sentences, can lie down, no retractions, pulse < 100.    - MODERATE: SOB at rest, SOB with minimal exertion and prefers to sit, cannot lie down flat, speaks in phrases, mild retractions, audible wheezing, pulse 100-120.    - SEVERE: Very SOB at rest, speaks in single words, struggling to breathe, sitting hunched forward, retractions, pulse > 120      moderate 5. RECURRENT SYMPTOM: "Have you had difficulty breathing before?" If Yes, ask: "When was the last time?" and "What happened that time?"      no 6. CARDIAC HISTORY: "Do you have any history of heart disease?" (e.g., heart attack, angina, bypass surgery, angioplasty)      nono 7. LUNG HISTORY: "Do you have any history of lung disease?"  (e.g., pulmonary embolus, asthma, emphysema)     no 8. CAUSE: "What do you think is causing the breathing problem?"      unknown 9. OTHER SYMPTOMS: "Do you have any other symptoms? (e.g., dizziness, runny nose, cough, chest pain, fever)     Pt describes a "stinging pain" when she breathes, cough 10. O2 SATURATION MONITOR:  "Do you use an oxygen saturation  monitor (pulse oximeter) at home?" If Yes, "What is your reading (oxygen level) today?" "What is your usual oxygen saturation reading?" (e.g., 95%)       no 11. PREGNANCY: "Is there any chance you are pregnant?" "When was your last menstrual period?"       No- thanksgiving 12. TRAVEL: "Have you traveled out of the country in the last month?" (e.g., travel history, exposures)       no  Answer Assessment - Initial Assessment Questions 1. LOCATION: "Where does it hurt?"       chest 2. RADIATION: "Does the pain go anywhere else?" (e.g., into neck, jaw, arms, back)     no 3. ONSET: "When did the chest pain begin?" (Minutes, hours or days)      Last night 4. PATTERN "Does the pain come and go, or has it been constant since it started?"  "Does it get worse with exertion?"      constant 5. DURATION: "How long does it last" (e.g., seconds, minutes, hours)     constant 6. SEVERITY: "How bad is the pain?"  (e.g., Scale 1-10; mild, moderate, or severe)    - MILD (1-3): doesn't interfere with normal activities     - MODERATE (4-7): interferes with normal activities or awakens from sleep    - SEVERE (8-10): excruciating pain, unable to do any normal activities  severe 7. CARDIAC RISK FACTORS: "Do you have any history of heart problems or risk factors for heart disease?" (e.g., angina, prior heart attack; diabetes, high blood pressure, high cholesterol, smoker, or strong family history of heart disease)     no 8. PULMONARY RISK FACTORS: "Do you have any history of lung disease?"  (e.g., blood clots in lung, asthma, emphysema, birth control pills)     no 9. CAUSE: "What do you think is causing the chest pain?"     unknown 10. OTHER SYMPTOMS: "Do you have any other symptoms?" (e.g., dizziness, nausea, vomiting, sweating, fever, difficulty breathing, cough)       Loose BM 3 , whezing 11. PREGNANCY: "Is there any chance you are pregnant?" "When was your last menstrual period?"       No-LMP: this  week  Protocols used: Chest Pain-A-AH, Breathing Difficulty-A-AH

## 2021-08-24 NOTE — Telephone Encounter (Signed)
FYI

## 2021-08-24 NOTE — Telephone Encounter (Signed)
Copied from CRM [id number]. Topic: Referral - Request for Referral >> Aug 24, 2021  9:27 AM Gwenlyn Fudge wrote: Has patient seen PCP for this complaint? Yes.   *If NO, is insurance requiring patient see PCP for this issue before PCP can refer them? Referral for which specialty: ENT Preferred provider/office: N/A Reason for referral: Pt was referred to an office on 08/03/21, but the provider retired and pt was not able to be seen. She is requesting to have another referral placed for her to be seen for her allergies. Please advise.   Please place new referral for patient to go to ENT. This is the patients second request as Dr. Bayard Hugger retired. Maryjean Morn, CMA

## 2021-08-25 ENCOUNTER — Encounter (HOSPITAL_COMMUNITY): Payer: Self-pay

## 2021-08-25 ENCOUNTER — Other Ambulatory Visit: Payer: Self-pay

## 2021-08-25 ENCOUNTER — Emergency Department (HOSPITAL_COMMUNITY)
Admission: EM | Admit: 2021-08-25 | Discharge: 2021-08-26 | Disposition: A | Discharge status: Home / Self Care | Payer: Medicare Other | Attending: Emergency Medicine | Admitting: Emergency Medicine

## 2021-08-25 DIAGNOSIS — N888 Other specified noninflammatory disorders of cervix uteri: Secondary | ICD-10-CM | POA: Diagnosis not present

## 2021-08-25 DIAGNOSIS — N12 Tubulo-interstitial nephritis, not specified as acute or chronic: Secondary | ICD-10-CM

## 2021-08-25 DIAGNOSIS — N898 Other specified noninflammatory disorders of vagina: Secondary | ICD-10-CM | POA: Diagnosis not present

## 2021-08-25 DIAGNOSIS — N949 Unspecified condition associated with female genital organs and menstrual cycle: Secondary | ICD-10-CM

## 2021-08-25 DIAGNOSIS — R519 Headache, unspecified: Secondary | ICD-10-CM | POA: Diagnosis not present

## 2021-08-25 DIAGNOSIS — R059 Cough, unspecified: Secondary | ICD-10-CM | POA: Insufficient documentation

## 2021-08-25 DIAGNOSIS — R1032 Left lower quadrant pain: Secondary | ICD-10-CM | POA: Diagnosis not present

## 2021-08-25 DIAGNOSIS — Z8616 Personal history of COVID-19: Secondary | ICD-10-CM | POA: Insufficient documentation

## 2021-08-25 DIAGNOSIS — Z20822 Contact with and (suspected) exposure to covid-19: Secondary | ICD-10-CM | POA: Insufficient documentation

## 2021-08-25 DIAGNOSIS — Z87891 Personal history of nicotine dependence: Secondary | ICD-10-CM | POA: Insufficient documentation

## 2021-08-25 DIAGNOSIS — R109 Unspecified abdominal pain: Secondary | ICD-10-CM | POA: Diagnosis not present

## 2021-08-25 DIAGNOSIS — R509 Fever, unspecified: Secondary | ICD-10-CM | POA: Diagnosis not present

## 2021-08-25 DIAGNOSIS — R102 Pelvic and perineal pain: Secondary | ICD-10-CM | POA: Diagnosis not present

## 2021-08-25 DIAGNOSIS — N739 Female pelvic inflammatory disease, unspecified: Secondary | ICD-10-CM | POA: Diagnosis not present

## 2021-08-25 DIAGNOSIS — R9431 Abnormal electrocardiogram [ECG] [EKG]: Secondary | ICD-10-CM | POA: Diagnosis not present

## 2021-08-25 LAB — RESP PANEL BY RT-PCR (FLU A&B, COVID) ARPGX2
Influenza A by PCR: NEGATIVE
Influenza B by PCR: NEGATIVE
SARS Coronavirus 2 by RT PCR: NEGATIVE

## 2021-08-25 LAB — URINALYSIS, ROUTINE W REFLEX MICROSCOPIC
Bilirubin Urine: NEGATIVE
Glucose, UA: NEGATIVE mg/dL
Hgb urine dipstick: NEGATIVE
Ketones, ur: 20 mg/dL — AB
Nitrite: POSITIVE — AB
Protein, ur: NEGATIVE mg/dL
Specific Gravity, Urine: 1.021 (ref 1.005–1.030)
pH: 5 (ref 5.0–8.0)

## 2021-08-25 NOTE — ED Provider Notes (Signed)
Emergency Medicine Provider Triage Evaluation Note  Leah Rojas , a 30 y.o. female  was evaluated in triage.  Pt complains of URI symptoms that began yesterday.  Daughter with the flu  Review of Systems  Positive: Chills, body aches, headache Negative: GI symptoms  Physical Exam  BP 126/78 (BP Location: Left Arm)   Pulse 79   Temp 99.8 F (37.7 C) (Oral)   Resp 16   Ht 5\' 4"  (1.626 m)   Wt 67.5 kg   SpO2 98%   BMI 25.56 kg/m  Gen:   Awake, no distress   Resp:  Normal effort  MSK:   Moves extremities without difficulty  Other:  RRR  Medical Decision Making  Medically screening exam initiated at 9:51 PM.  Appropriate orders placed.  was informed that the remainder of the evaluation will be completed by another provider, this initial triage assessment does not replace that evaluation, and the importance of remaining in the ED until their evaluation is complete.     Sharyn Blitz 08/25/21 2154    [id number], MD 08/25/21 2352

## 2021-08-25 NOTE — ED Triage Notes (Signed)
Pt reports cough, low grade fevers, headache, and lack of appetite recently. Flu going through household. Pt requesting UA done to r/o uti.

## 2021-08-26 ENCOUNTER — Emergency Department (HOSPITAL_COMMUNITY): Payer: Medicare Other

## 2021-08-26 DIAGNOSIS — R102 Pelvic and perineal pain: Secondary | ICD-10-CM | POA: Diagnosis not present

## 2021-08-26 DIAGNOSIS — R059 Cough, unspecified: Secondary | ICD-10-CM | POA: Diagnosis not present

## 2021-08-26 DIAGNOSIS — R519 Headache, unspecified: Secondary | ICD-10-CM | POA: Diagnosis not present

## 2021-08-26 DIAGNOSIS — R109 Unspecified abdominal pain: Secondary | ICD-10-CM | POA: Diagnosis not present

## 2021-08-26 DIAGNOSIS — R509 Fever, unspecified: Secondary | ICD-10-CM | POA: Diagnosis not present

## 2021-08-26 LAB — COMPREHENSIVE METABOLIC PANEL
ALT: 14 U/L (ref 0–44)
AST: 16 U/L (ref 15–41)
Albumin: 4 g/dL (ref 3.5–5.0)
Alkaline Phosphatase: 43 U/L (ref 38–126)
Anion gap: 7 (ref 5–15)
BUN: 14 mg/dL (ref 6–20)
CO2: 23 mmol/L (ref 22–32)
Calcium: 8.3 mg/dL — ABNORMAL LOW (ref 8.9–10.3)
Chloride: 103 mmol/L (ref 98–111)
Creatinine, Ser: 0.58 mg/dL (ref 0.44–1.00)
GFR, Estimated: >60 mL/min (ref >60)
Glucose, Bld: 91 mg/dL (ref 70–99)
Potassium: 3.2 mmol/L — ABNORMAL LOW (ref 3.5–5.1)
Sodium: 133 mmol/L — ABNORMAL LOW (ref 135–145)
Total Bilirubin: 0.6 mg/dL (ref 0.3–1.2)
Total Protein: 7.3 g/dL (ref 6.5–8.1)

## 2021-08-26 LAB — CBC WITH DIFFERENTIAL/PLATELET
Abs Immature Granulocytes: 0.01 10*3/uL (ref 0.00–0.07)
Basophils Absolute: 0 10*3/uL (ref 0.0–0.1)
Basophils Relative: 1 %
Eosinophils Absolute: 0 10*3/uL (ref 0.0–0.5)
Eosinophils Relative: 1 %
HCT: 35.9 % — ABNORMAL LOW (ref 36.0–46.0)
Hemoglobin: 12 g/dL (ref 12.0–15.0)
Immature Granulocytes: 0 %
Lymphocytes Relative: 29 %
Lymphs Abs: 1.2 10*3/uL (ref 0.7–4.0)
MCH: 32.1 pg (ref 26.0–34.0)
MCHC: 33.4 g/dL (ref 30.0–36.0)
MCV: 96 fL (ref 80.0–100.0)
Monocytes Absolute: 0.8 10*3/uL (ref 0.1–1.0)
Monocytes Relative: 19 %
Neutro Abs: 2.2 10*3/uL (ref 1.7–7.7)
Neutrophils Relative %: 50 %
Platelets: 276 10*3/uL (ref 150–400)
RBC: 3.74 MIL/uL — ABNORMAL LOW (ref 3.87–5.11)
RDW: 13.9 % (ref 11.5–15.5)
WBC: 4.3 10*3/uL (ref 4.0–10.5)
nRBC: 0 % (ref 0.0–0.2)

## 2021-08-26 LAB — WET PREP, GENITAL
Sperm: NONE SEEN
Trich, Wet Prep: NONE SEEN
WBC, Wet Prep HPF POC: >=10 — AB (ref <10)
Yeast Wet Prep HPF POC: NONE SEEN

## 2021-08-26 LAB — I-STAT BETA HCG BLOOD, ED (MC, WL, AP ONLY): I-stat hCG, quantitative: <5 m[IU]/mL (ref <5)

## 2021-08-26 LAB — LACTIC ACID, PLASMA
Lactic Acid, Venous: 0.7 mmol/L (ref 0.5–1.9)
Lactic Acid, Venous: 1.1 mmol/L (ref 0.5–1.9)

## 2021-08-26 LAB — LIPASE, BLOOD: Lipase: 28 U/L (ref 11–51)

## 2021-08-26 MED ORDER — METRONIDAZOLE 500 MG PO TABS
500.0000 mg | ORAL_TABLET | Freq: Once | ORAL | Status: AC
  Administered 2021-08-26: 500 mg via ORAL
  Filled 2021-08-26: qty 1

## 2021-08-26 MED ORDER — CEFTRIAXONE SODIUM 1 G IJ SOLR
500.0000 mg | Freq: Once | INTRAMUSCULAR | Status: AC
  Administered 2021-08-26: 500 mg via INTRAMUSCULAR
  Filled 2021-08-26: qty 10

## 2021-08-26 MED ORDER — SODIUM CHLORIDE 0.9 % IV SOLN
1.0000 g | Freq: Once | INTRAVENOUS | Status: AC
  Administered 2021-08-26: 1 g via INTRAVENOUS
  Filled 2021-08-26: qty 10

## 2021-08-26 MED ORDER — METRONIDAZOLE 500 MG PO TABS: 500.0000 mg | ORAL_TABLET | Freq: Two times a day (BID) | ORAL | 0 refills | Status: DC

## 2021-08-26 MED ORDER — ONDANSETRON 4 MG PO TBDP
4.0000 mg | ORAL_TABLET | Freq: Once | ORAL | Status: AC
  Administered 2021-08-26: 4 mg via ORAL
  Filled 2021-08-26: qty 1

## 2021-08-26 MED ORDER — LIDOCAINE HCL 1 % IJ SOLN
INTRAMUSCULAR | Status: AC
  Administered 2021-08-26: 2.1 mL
  Filled 2021-08-26: qty 20

## 2021-08-26 MED ORDER — ONDANSETRON HCL 4 MG PO TABS: 4.0000 mg | ORAL_TABLET | Freq: Three times a day (TID) | ORAL | 0 refills | Status: DC | PRN

## 2021-08-26 MED ORDER — IOHEXOL 350 MG/ML SOLN
75.0000 mL | Freq: Once | INTRAVENOUS | Status: AC | PRN
  Administered 2021-08-26: 75 mL via INTRAVENOUS

## 2021-08-26 MED ORDER — DOXYCYCLINE HYCLATE 100 MG PO CAPS: 100.0000 mg | ORAL_CAPSULE | Freq: Two times a day (BID) | ORAL | 0 refills | Status: AC

## 2021-08-26 MED ORDER — CEPHALEXIN 500 MG PO CAPS: 500.0000 mg | ORAL_CAPSULE | Freq: Two times a day (BID) | ORAL | 0 refills | Status: AC

## 2021-08-26 MED ORDER — DOXYCYCLINE HYCLATE 100 MG PO TABS
100.0000 mg | ORAL_TABLET | Freq: Once | ORAL | Status: AC
  Administered 2021-08-26: 100 mg via ORAL
  Filled 2021-08-26: qty 1

## 2021-08-26 MED ORDER — SODIUM CHLORIDE (PF) 0.9 % IJ SOLN
INTRAMUSCULAR | Status: AC
  Filled 2021-08-26: qty 50

## 2021-08-26 MED ORDER — CEFTRIAXONE SODIUM 1 G IJ SOLR: 500.0000 mg | Freq: Once | INTRAMUSCULAR | Status: DC

## 2021-08-26 MED ORDER — LACTATED RINGERS IV BOLUS
1000.0000 mL | Freq: Once | INTRAVENOUS | Status: AC
  Administered 2021-08-26: 1000 mL via INTRAVENOUS
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
## 2021-08-26 NOTE — Discharge Instructions (Addendum)
You were evaluated in the Emergency Department and after careful evaluation, we did not find any emergent condition requiring admission or further testing in the hospital.  You were treated for a UTI that is spread to your kidneys as well as possible gonorrhea and chlamydia.  Your gonorrhea and Chlamydia results will be available in several days via the MyChart app, there are instructions on your discharge paperwork on how to download this.  If these are positive, you will receive a call from our department.  Please have all partners tested and treated if your results are positive.  Please make sure to take the prescribed antibiotics as directed until finished.  Please make sure to follow-up with your primary care doctor.   Please return to the Emergency Department if you experience worsening fevers, chills, pelvic/abdominal pain.  We encourage you to follow up with a primary care provider.  Thank you for allowing [place] to be a part of your care.

## 2021-08-27 LAB — GC/CHLAMYDIA PROBE AMP (~~LOC~~) NOT AT ARMC
Chlamydia: NEGATIVE
Comment: NEGATIVE
Comment: NORMAL
Neisseria Gonorrhea: NEGATIVE

## 2021-08-28 LAB — URINE CULTURE: Culture: >=100000 — AB

## 2021-08-30 NOTE — Telephone Encounter (Signed)
Pt is calling because she continue to go to [facility] and the hospital. Pt is needed to a referral to go to the allergist. Can a new referral be placed so that the patient can get an appt with the allergist.

## 2021-08-31 ENCOUNTER — Telehealth (INDEPENDENT_AMBULATORY_CARE_PROVIDER_SITE_OTHER): Payer: Self-pay | Admitting: Primary Care

## 2021-08-31 NOTE — Telephone Encounter (Signed)
Patient upset taking so long to get a new referral. I did giver her the info of a call back to schedul an ENT appt. Paitent wants call back.

## 2021-08-31 NOTE — Telephone Encounter (Signed)
Patient will call around to find an allergist that takes her insurance and call the office to inform.

## 2021-08-31 NOTE — Telephone Encounter (Signed)
Left message asking patient to return call to office.  Referral has been placed for ENT. Speciality office will call patient to schedule.

## 2021-09-03 ENCOUNTER — Ambulatory Visit (INDEPENDENT_AMBULATORY_CARE_PROVIDER_SITE_OTHER): Payer: Self-pay | Admitting: *Deleted

## 2021-09-03 NOTE — Telephone Encounter (Signed)
Pt stated she hasn't been feeling well for about a month. Possible allergies runny nose/eyes pt has been to the ER and has not tested positive for the flu.  Pt has been taking over the counter medication and nothing works.  [facility] Pt stated does not have a good rapport with her current PCP .       Called patient to review symptoms and request for medication and referral to new doctor. C/o nasal stuffiness and watery eyes and antihistamines OTC not effective. Recommended to see PCP . Patient has appt 11/08/20 at [facility]. Recommended My Chart E visit , health dept. Or ED if symptoms worsen. Denies breathing difficulties , no fever. Patient verbalized understanding of care advise and to call back  if needed.

## 2021-09-03 NOTE — Telephone Encounter (Signed)
Patient changing providers. See documented note. She is aware that referral was sent to [facility]. Provided phone number for patient to call and inquire about scheduling appointment.

## 2021-09-03 NOTE — Telephone Encounter (Signed)
Reason for Disposition  [1] Nasal allergies AND [2] year-round symptoms  Answer Assessment - Initial Assessment Questions 1. SYMPTOM: "What's the main symptom you're concerned about?" (e.g., runny nose, stuffiness, sneezing, itching)     Nasal stuffiness 2. SEVERITY: "How bad is it?" "What does it keep you from doing?" (e.g., sleeping, working)      Na  3. EYES: "Are the eyes also red, watery, and itchy?"      Watery  4. TRIGGER: "What pollen or other allergic substance do you think is causing the symptoms?"      Na  5. TREATMENT: "What medicine are you using?" "What medicine worked best in the past?"     Benadryl  6. OTHER SYMPTOMS: "Do you have any other symptoms?" (e.g., coughing, difficulty breathing, wheezing)     Na  7. PREGNANCY: "Is there any chance you are pregnant?" "When was your last menstrual period?"     na  Protocols used: Nasal Allergies (Hay Fever)-A-AH

## 2021-09-28 DIAGNOSIS — Z87828 Personal history of other (healed) physical injury and trauma: Secondary | ICD-10-CM | POA: Diagnosis not present

## 2021-09-28 DIAGNOSIS — J310 Chronic rhinitis: Secondary | ICD-10-CM | POA: Diagnosis not present

## 2021-10-11 ENCOUNTER — Encounter (HOSPITAL_COMMUNITY): Payer: Self-pay

## 2021-10-11 ENCOUNTER — Other Ambulatory Visit: Payer: Self-pay

## 2021-10-11 ENCOUNTER — Ambulatory Visit (HOSPITAL_COMMUNITY)
Admission: EM | Admit: 2021-10-11 | Discharge: 2021-10-11 | Disposition: A | Discharge status: Home / Self Care | Payer: Medicare Other | Attending: Urgent Care | Admitting: Urgent Care

## 2021-10-11 DIAGNOSIS — N3000 Acute cystitis without hematuria: Secondary | ICD-10-CM | POA: Insufficient documentation

## 2021-10-11 DIAGNOSIS — N760 Acute vaginitis: Secondary | ICD-10-CM | POA: Insufficient documentation

## 2021-10-11 DIAGNOSIS — N765 Ulceration of vagina: Secondary | ICD-10-CM | POA: Insufficient documentation

## 2021-10-11 DIAGNOSIS — B3731 Acute candidiasis of vulva and vagina: Secondary | ICD-10-CM | POA: Insufficient documentation

## 2021-10-11 LAB — POCT URINALYSIS DIPSTICK, ED / UC
Bilirubin Urine: NEGATIVE
Glucose, UA: NEGATIVE mg/dL
Hgb urine dipstick: NEGATIVE
Ketones, ur: NEGATIVE mg/dL
Nitrite: POSITIVE — AB
Protein, ur: NEGATIVE mg/dL
Specific Gravity, Urine: >=1.03 (ref 1.005–1.030)
Urobilinogen, UA: 1 mg/dL (ref 0.0–1.0)
pH: 6.5 (ref 5.0–8.0)

## 2021-10-11 LAB — POC URINE PREG, ED: Preg Test, Ur: NEGATIVE

## 2021-10-11 MED ORDER — NITROFURANTOIN MONOHYD MACRO 100 MG PO CAPS
100.0000 mg | ORAL_CAPSULE | Freq: Two times a day (BID) | ORAL | 0 refills | Status: AC
Start: 2021-10-11 — End: 2021-10-18

## 2021-10-11 MED ORDER — METRONIDAZOLE 500 MG PO TABS
500.0000 mg | ORAL_TABLET | Freq: Two times a day (BID) | ORAL | 0 refills | Status: AC
Start: 2021-10-11 — End: 2021-10-18

## 2021-10-11 MED ORDER — FLUCONAZOLE 150 MG PO TABS: 150.0000 mg | ORAL_TABLET | Freq: Once | ORAL | 0 refills | Status: AC

## 2021-10-11 NOTE — Discharge Instructions (Signed)
Your urine indicates a urinary tract infection.  Please take Macrobid twice daily until gone. We tested you for gonorrhea, chlamydia, trichomonas, bacterial vaginosis, yeast.  I would like you to start taking Diflucan x1 dose, and metronidazole twice daily until gone.  Do not drink any alcohol while taking this medication. Please avoid any and all forms of intercourse until your results are obtained. We also took a swab to test for herpes simplex.  Please apply Vaseline to the open areas around your vagina.  Follow-up with a specialist regarding your recurrence of urinary tract infections.
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
## 2021-10-11 NOTE — ED Triage Notes (Signed)
Pt presents with vaginal irritation ( rash like) with some pain, itching & discharge for over a week.

## 2021-10-12 LAB — CERVICOVAGINAL ANCILLARY ONLY
Bacterial Vaginitis (gardnerella): POSITIVE — AB
Candida Glabrata: NEGATIVE
Candida Vaginitis: POSITIVE — AB
Chlamydia: NEGATIVE
Comment: NEGATIVE
Comment: NEGATIVE
Comment: NEGATIVE
Comment: NEGATIVE
Comment: NEGATIVE
Comment: NORMAL
Neisseria Gonorrhea: NEGATIVE
Trichomonas: NEGATIVE

## 2021-10-13 LAB — URINE CULTURE: Culture: >=100000 — AB

## 2021-10-13 LAB — HSV CULTURE AND TYPING

## 2021-10-25 ENCOUNTER — Encounter (HOSPITAL_COMMUNITY): Payer: Self-pay

## 2021-10-25 ENCOUNTER — Other Ambulatory Visit: Payer: Self-pay

## 2021-10-25 ENCOUNTER — Emergency Department (HOSPITAL_COMMUNITY)
Admission: EM | Admit: 2021-10-25 | Discharge: 2021-10-25 | Disposition: A | Discharge status: Home / Self Care | Payer: Medicare Other | Attending: Emergency Medicine | Admitting: Emergency Medicine

## 2021-10-25 ENCOUNTER — Emergency Department (HOSPITAL_COMMUNITY): Payer: Medicare Other

## 2021-10-25 DIAGNOSIS — R059 Cough, unspecified: Secondary | ICD-10-CM | POA: Insufficient documentation

## 2021-10-25 DIAGNOSIS — R062 Wheezing: Secondary | ICD-10-CM | POA: Diagnosis not present

## 2021-10-25 DIAGNOSIS — R1110 Vomiting, unspecified: Secondary | ICD-10-CM | POA: Diagnosis not present

## 2021-10-25 DIAGNOSIS — R051 Acute cough: Secondary | ICD-10-CM | POA: Diagnosis not present

## 2021-10-25 DIAGNOSIS — Z20822 Contact with and (suspected) exposure to covid-19: Secondary | ICD-10-CM | POA: Diagnosis not present

## 2021-10-25 LAB — RESP PANEL BY RT-PCR (FLU A&B, COVID) ARPGX2
Influenza A by PCR: NEGATIVE
Influenza B by PCR: NEGATIVE
SARS Coronavirus 2 by RT PCR: NEGATIVE

## 2021-10-25 MED ORDER — BENZONATATE 100 MG PO CAPS: 100.0000 mg | ORAL_CAPSULE | Freq: Three times a day (TID) | ORAL | 0 refills | Status: DC

## 2021-10-25 MED ORDER — BENZONATATE 100 MG PO CAPS
100.0000 mg | ORAL_CAPSULE | Freq: Once | ORAL | Status: AC
  Administered 2021-10-25: 100 mg via ORAL
  Filled 2021-10-25: qty 1

## 2021-10-25 MED ORDER — ALBUTEROL SULFATE HFA 108 (90 BASE) MCG/ACT IN AERS
2.0000 | INHALATION_SPRAY | Freq: Once | RESPIRATORY_TRACT | Status: AC
  Administered 2021-10-25: 2 via RESPIRATORY_TRACT
  Filled 2021-10-25: qty 6.7

## 2021-10-25 NOTE — ED Provider Notes (Signed)
[facility] DEPT Provider Note   CSN: [id number] Arrival date & time: 10/25/21  0145     History  Chief Complaint  Patient presents with   Cough    Leah Rojas is a 31 y.o. female.  The history is provided by the patient and medical records.  Cough  31 y.o. F presenting to the ED for cough.  Started last night around 11PM, worse around 1am.  She reports 2 episodes of post-tussive emesis.  She denies any nausea/vomiting otherwise.  No diarrhea.  No sick contacts.  No fever.  Has used warm tea with honey and nyquil without relief.  Home Medications Prior to Admission medications   Medication Sig Start Date End Date Taking? Authorizing Provider  benzonatate (TESSALON) 100 MG capsule Take 1 capsule (100 mg total) by mouth every 8 (eight) hours. 10/25/21  Yes Larene Pickett, PA-C  ondansetron (ZOFRAN) 4 MG tablet Take 1 tablet (4 mg total) by mouth every 8 (eight) hours as needed for nausea or vomiting. 08/26/21   Evlyn Courier, PA-C  sertraline (ZOLOFT) 25 MG tablet TAKE 1 TABLET BY MOUTH EVERY DAY 08/18/21   Meredith Staggers, MD  sertraline (ZOLOFT) 50 MG tablet Take 1 tablet (50 mg total) by mouth daily. 10/07/20   Meredith Staggers, MD  traZODone (DESYREL) 50 MG tablet Take 0.5-1 tablets (25-50 mg total) by mouth at bedtime. Patient not taking: Reported on 04/08/2020 02/12/18 07/23/20  Meredith Staggers, MD      Allergies    Patient has no known allergies.    Review of Systems   Review of Systems  Respiratory:  Positive for cough.   All other systems reviewed and are negative.  Physical Exam Updated Vital Signs BP 118/81 (BP Location: Right Arm)    Pulse 85    Temp (!) 97.4 F (36.3 C)    Resp 17    Ht 5\' 4"  (1.626 m)    Wt 68 kg    LMP 10/13/2021 (Approximate)    SpO2 99%    BMI 25.75 kg/m   Physical Exam Vitals and nursing note reviewed.  Constitutional:      Appearance: She is well-developed.  HENT:     Head: Normocephalic and  atraumatic.  Eyes:     Conjunctiva/sclera: Conjunctivae normal.     Pupils: Pupils are equal, round, and reactive to light.  Cardiovascular:     Rate and Rhythm: Normal rate and regular rhythm.     Heart sounds: Normal heart sounds.  Pulmonary:     Effort: Pulmonary effort is normal. No respiratory distress.     Breath sounds: Wheezing present. No rhonchi.     Comments: Very faint expiratory wheeze on exam, dry cough, NAD, able to speak in sentences without difficulty Abdominal:     General: Bowel sounds are normal.     Palpations: Abdomen is soft.  Musculoskeletal:        General: Normal range of motion.     Cervical back: Normal range of motion.  Skin:    General: Skin is warm and dry.  Neurological:     Mental Status: She is alert and oriented to person, place, and time.    ED Results / Procedures / Treatments   Labs (all labs ordered are listed, but only abnormal results are displayed) Labs Reviewed  RESP PANEL BY RT-PCR (FLU A&B, COVID) [id number]    EKG None  Radiology DG Chest [facility] 1 View  Result Date: 10/25/2021 CLINICAL DATA:  Cough, vomiting EXAM: PORTABLE CHEST 1 VIEW COMPARISON:  08/26/2021 FINDINGS: Lungs are clear.  No pleural effusion or pneumothorax. The heart is normal in size. IMPRESSION: No evidence of acute cardiopulmonary disease. Electronically Signed   By: Julian Hy M.D.   On: 10/25/2021 02:46    Procedures Procedures    Medications Ordered in ED Medications  albuterol (VENTOLIN HFA) 108 (90 Base) MCG/ACT inhaler 2 puff (2 puffs Inhalation Given 10/25/21 0315)  benzonatate (TESSALON) capsule 100 mg (100 mg Oral Given 10/25/21 [id number])    ED Course/ Medical Decision Making/ A&P                           Medical Decision Making Amount and/or Complexity of Data Reviewed Radiology: ordered and independent interpretation performed.  Risk Prescription drug management.  31 y.o. F here with cough since 11pm last evening.  Post-tussive emesis x2.   No real associated symptoms.  She is afebrile, non-toxic in appearance here.  Dry cough and faint expiratory wheeze on exam but in no acute respiratory distress.  Chest x-ray was obtained which I reviewed and is negative.  COVID/flu screening also negative.  May be related to bronchospasm.  She is given inhaler here and plan to discharge home with Tessalon as needed.  Can follow-up with PCP.  Return here for new or acute changes.  Final Clinical Impression(s) / ED Diagnoses Final diagnoses:  Acute cough    Rx / DC Orders ED Discharge Orders          Ordered    benzonatate (TESSALON) 100 MG capsule  Every 8 hours        10/25/21 0324              Larene Pickett, PA-C 10/25/21 0333    Molpus, Jenny Reichmann, MD 10/25/21 [phone]

## 2021-10-25 NOTE — ED Triage Notes (Signed)
Pt states that she started coughing around 11 pm last night. Pt has been coughing so much that she is vomiting.

## 2021-10-25 NOTE — Discharge Instructions (Signed)
Chest x-ray was normal, no pneumonia.  Covid and flu test also negative. Cough medication sent to pharmacy for you. Follow-up with your primary care doctor. Return here for new concerns.

## 2021-11-03 DIAGNOSIS — J310 Chronic rhinitis: Secondary | ICD-10-CM | POA: Diagnosis not present

## 2021-11-03 DIAGNOSIS — H10403 Unspecified chronic conjunctivitis, bilateral: Secondary | ICD-10-CM | POA: Diagnosis not present

## 2021-11-08 ENCOUNTER — Ambulatory Visit: Payer: Medicare Other | Admitting: Family Medicine

## 2021-11-15 ENCOUNTER — Telehealth: Payer: Self-pay | Admitting: Physical Medicine & Rehabilitation

## 2021-11-15 NOTE — Telephone Encounter (Signed)
Patients mother needs an updated letter for her daycare for her daughter.  She mentioned that you have done one for her in the past. I did let mother know that you would not be back in clinic until next Wednesday.

## 2021-11-16 NOTE — Telephone Encounter (Addendum)
The last letter was  created on 05/01/2019. She will need to submit the updated letter ASAP. Per Leah Rojas (Mom) this is the second request for the letter this month.   Thank you

## 2021-11-21 NOTE — Telephone Encounter (Signed)
Note is in system. It needs my signature/stamp. thx

## 2021-11-24 ENCOUNTER — Encounter: Payer: Self-pay | Admitting: Family Medicine

## 2021-11-24 ENCOUNTER — Ambulatory Visit: Payer: Medicare Other | Admitting: Family Medicine

## 2021-11-24 ENCOUNTER — Ambulatory Visit: Payer: Medicare HMO | Attending: Family Medicine | Admitting: Family Medicine

## 2021-11-24 ENCOUNTER — Other Ambulatory Visit: Payer: Self-pay

## 2021-11-24 VITALS — BP 120/78 | HR 81 | Ht 64.0 in | Wt 160.2 lb

## 2021-11-24 DIAGNOSIS — Z87820 Personal history of traumatic brain injury: Secondary | ICD-10-CM | POA: Diagnosis not present

## 2021-11-24 DIAGNOSIS — J309 Allergic rhinitis, unspecified: Secondary | ICD-10-CM | POA: Insufficient documentation

## 2021-11-24 DIAGNOSIS — F329 Major depressive disorder, single episode, unspecified: Secondary | ICD-10-CM

## 2021-11-24 DIAGNOSIS — J3089 Other allergic rhinitis: Secondary | ICD-10-CM

## 2021-11-24 NOTE — Progress Notes (Signed)
Recurring BV ?
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
## 2021-12-16 ENCOUNTER — Ambulatory Visit (HOSPITAL_COMMUNITY)
Admission: EM | Admit: 2021-12-16 | Discharge: 2021-12-16 | Disposition: A | Discharge status: Home / Self Care | Payer: Medicare HMO | Attending: Family Medicine | Admitting: Family Medicine

## 2021-12-16 ENCOUNTER — Other Ambulatory Visit: Payer: Self-pay

## 2021-12-16 ENCOUNTER — Encounter (HOSPITAL_COMMUNITY): Payer: Self-pay | Admitting: *Deleted

## 2021-12-16 DIAGNOSIS — N760 Acute vaginitis: Secondary | ICD-10-CM | POA: Insufficient documentation

## 2021-12-16 DIAGNOSIS — N3001 Acute cystitis with hematuria: Secondary | ICD-10-CM | POA: Diagnosis present

## 2021-12-16 LAB — POCT URINALYSIS DIPSTICK, ED / UC
Bilirubin Urine: NEGATIVE
Glucose, UA: NEGATIVE mg/dL
Ketones, ur: NEGATIVE mg/dL
Nitrite: POSITIVE — AB
Protein, ur: 100 mg/dL — AB
Specific Gravity, Urine: 1.025 (ref 1.005–1.030)
Urobilinogen, UA: 1 mg/dL (ref 0.0–1.0)
pH: 7 (ref 5.0–8.0)

## 2021-12-16 LAB — POC URINE PREG, ED: Preg Test, Ur: NEGATIVE

## 2021-12-16 MED ORDER — NITROFURANTOIN MONOHYD MACRO 100 MG PO CAPS: 100.0000 mg | ORAL_CAPSULE | Freq: Two times a day (BID) | ORAL | 0 refills | Status: DC

## 2021-12-16 NOTE — ED Triage Notes (Signed)
Pt reports urinary dysuria,urgency,frequency. Pt reports a tan Leah Rojas Vag discharge. Phas frequent UTI's ?

## 2021-12-16 NOTE — Discharge Instructions (Signed)
Urinalysis today was positive for Leah Rojas blood cells and nitrates which is indicative of infection, your urine sample has been sent to the lab to determine exactly which bacteria is present, if any changes need to be made to your medication you will be notified ? ?Begin use of Macrobid.  Taking twice daily for the next 5 days, you should start seeing improvement in your symptoms after 2 days use of medicine ? ?You may take Tylenol or ibuprofen for comfort ? ?Typically urinary infections do not make changes to vaginal discharge therefore we will check for further infection ? ?Your vaginal swab will result in 2 to 3 days, you will be notified of any positive results, if any results are positive you will be notified and medication sent to the pharmacy ? ?You may follow-up with urgent care as needed for persistent or reoccurring symptoms ?
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
## 2021-12-17 LAB — CERVICOVAGINAL ANCILLARY ONLY
Bacterial Vaginitis (gardnerella): POSITIVE — AB
Candida Glabrata: NEGATIVE
Candida Vaginitis: NEGATIVE
Chlamydia: NEGATIVE
Comment: NEGATIVE
Comment: NEGATIVE
Comment: NEGATIVE
Comment: NEGATIVE
Comment: NEGATIVE
Comment: NORMAL
Neisseria Gonorrhea: NEGATIVE
Trichomonas: NEGATIVE

## 2021-12-19 LAB — URINE CULTURE: Culture: >=100000 — AB

## 2021-12-20 ENCOUNTER — Telehealth (HOSPITAL_COMMUNITY): Payer: Self-pay | Admitting: Emergency Medicine

## 2021-12-20 MED ORDER — METRONIDAZOLE 500 MG PO TABS: 500.0000 mg | ORAL_TABLET | Freq: Two times a day (BID) | ORAL | 0 refills | Status: DC

## 2021-12-22 ENCOUNTER — Encounter: Payer: Medicare HMO | Attending: Physical Medicine & Rehabilitation | Admitting: Physical Medicine & Rehabilitation

## 2021-12-22 ENCOUNTER — Encounter: Payer: Self-pay | Admitting: Physical Medicine & Rehabilitation

## 2021-12-22 VITALS — BP 123/76 | HR 82 | Ht 64.0 in | Wt 158.0 lb

## 2021-12-22 DIAGNOSIS — S06306S Unspecified focal traumatic brain injury with loss of consciousness greater than 24 hours without return to pre-existing conscious level with patient surviving, sequela: Secondary | ICD-10-CM | POA: Diagnosis present

## 2021-12-22 DIAGNOSIS — S06306D Unspecified focal traumatic brain injury with loss of consciousness greater than 24 hours without return to pre-existing conscious level with patient surviving, subsequent encounter: Secondary | ICD-10-CM | POA: Diagnosis not present

## 2021-12-22 MED ORDER — SERTRALINE HCL 25 MG PO TABS
25.0000 mg | ORAL_TABLET | Freq: Every day | ORAL | 4 refills | Status: DC
Start: 2021-12-22 — End: 2022-06-29
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
## 2021-12-22 NOTE — Patient Instructions (Signed)
PLEASE FEEL FREE TO CALL OUR OFFICE WITH ANY PROBLEMS OR QUESTIONS ([phone])      

## 2022-02-08 ENCOUNTER — Ambulatory Visit (INDEPENDENT_AMBULATORY_CARE_PROVIDER_SITE_OTHER): Payer: Medicare HMO

## 2022-02-08 ENCOUNTER — Ambulatory Visit (INDEPENDENT_AMBULATORY_CARE_PROVIDER_SITE_OTHER): Payer: Medicare HMO | Admitting: Podiatry

## 2022-02-08 DIAGNOSIS — M21611 Bunion of right foot: Secondary | ICD-10-CM | POA: Diagnosis not present

## 2022-02-08 DIAGNOSIS — M21612 Bunion of left foot: Secondary | ICD-10-CM | POA: Diagnosis not present

## 2022-02-08 DIAGNOSIS — M79672 Pain in left foot: Secondary | ICD-10-CM

## 2022-02-08 DIAGNOSIS — M778 Other enthesopathies, not elsewhere classified: Secondary | ICD-10-CM

## 2022-02-08 DIAGNOSIS — M21619 Bunion of unspecified foot: Secondary | ICD-10-CM

## 2022-02-08 DIAGNOSIS — M2042 Other hammer toe(s) (acquired), left foot: Secondary | ICD-10-CM | POA: Diagnosis not present

## 2022-02-08 DIAGNOSIS — M79671 Pain in right foot: Secondary | ICD-10-CM

## 2022-02-08 DIAGNOSIS — M2041 Other hammer toe(s) (acquired), right foot: Secondary | ICD-10-CM

## 2022-02-08 DIAGNOSIS — L84 Corns and callosities: Secondary | ICD-10-CM | POA: Diagnosis not present

## 2022-02-08 NOTE — Progress Notes (Signed)
Subjective:   Patient ID: Leah Rojas, female   DOB: 31 y.o.   MRN: [id number]   HPI 31 year old female presents the office today with concerns of bilateral foot pain mostly on the bunions on both feet with the right foot worse than left.  She also gets corns on the third and fourth mostly.  No recent injuries.  She states the symptoms have worsened over the last year.  She gets sharp, pain on the bunion at times.  Also states it gets worse when the weather changes.  She has not had any recent treatment.   Review of Systems  All other systems reviewed and are negative.  Past Medical History:  Diagnosis Date   Bronchitis    rescue inhaler prn   Traumatic brain injury     Past Surgical History:  Procedure Laterality Date   NO PAST SURGERIES       Current Outpatient Medications:    amoxicillin (AMOXIL) 500 MG capsule, Take 500 mg by mouth 3 (three) times daily., Disp: , Rfl:    Azelastine HCl 137 MCG/SPRAY SOLN, PLEASE SEE ATTACHED FOR DETAILED DIRECTIONS, Disp: , Rfl:    benzonatate (TESSALON) 100 MG capsule, Take 1 capsule (100 mg total) by mouth every 8 (eight) hours. (Patient not taking: Reported on 11/24/2021), Disp: 21 capsule, Rfl: 0   diphenhydrAMINE (SOMINEX) 25 MG tablet, Take by mouth., Disp: , Rfl:    EPINEPHrine 0.3 mg/0.3 mL IJ SOAJ injection, Inject into the muscle., Disp: , Rfl:    fluconazole (DIFLUCAN) 150 MG tablet, Take 150 mg by mouth once., Disp: , Rfl:    metroNIDAZOLE (FLAGYL) 500 MG tablet, Take 1 tablet (500 mg total) by mouth 2 (two) times daily., Disp: 14 tablet, Rfl: 0   montelukast (SINGULAIR) 10 MG tablet, Take 1 pill by mouth the day before and day of your rush appointment., Disp: , Rfl:    nitrofurantoin, macrocrystal-monohydrate, (MACROBID) 100 MG capsule, Take 1 capsule (100 mg total) by mouth 2 (two) times daily., Disp: 10 capsule, Rfl: 0   Olopatadine HCl 0.2 % SOLN, Place 1 drop into both eyes daily as needed., Disp: , Rfl:    ondansetron (ZOFRAN)  4 MG tablet, Take 1 tablet (4 mg total) by mouth every 8 (eight) hours as needed for nausea or vomiting. (Patient not taking: Reported on 11/24/2021), Disp: 12 tablet, Rfl: 0   predniSONE (DELTASONE) 20 MG tablet, Take 2 pills (40 mg total) by mouth the day before and day of your rush appointment., Disp: , Rfl:    sertraline (ZOLOFT) 25 MG tablet, Take 1 tablet (25 mg total) by mouth daily., Disp: 90 tablet, Rfl: 4  No Known Allergies        Objective:  Physical Exam  General: AAO x3, NAD  Dermatological: Minimal Hyper-Tet tissue along the IPJ's of the lesser digits.  Also hyperkeratotic lesion submetatarsal 5 bilaterally.  No underlying ulceration drainage or signs of infection.  Vascular: Dorsalis Pedis artery and Posterior Tibial artery pedal pulses are 2/4 bilateral with immedate capillary fill time.  There is no pain with calf compression, swelling, warmth, erythema.   Neruologic: Grossly intact via light touch bilateral.   Musculoskeletal: Moderate bunions are present bilaterally.  Tenderness really along the medial eminence of the first metatarsal head.  No crepitation or restriction with MPJ range of motion.  No significant hypermobility brace present.  Hammertoes are to the lesser digits which are flexible.  Muscular strength 5/5 in all groups tested bilateral.  Gait: Unassisted,  Nonantalgic.       Assessment:   31 year old female with first MPJ capsulitis, bunion; hammertoe deformities     Plan:  -Treatment options discussed including all alternatives, risks, and complications -Etiology of symptoms were discussed -X-rays were obtained and reviewed with the patient.  3 views of bilateral feet were obtained.  Moderate bunions are present bilaterally.  There is no evidence of acute fracture.  Hammertoes present. -Discussed with conservative as well as surgical treatment options.  At this point she has not had significant conservative care warranted continue with this before  proceeding with surgery and she agrees with this plan.  She was to proceed with steroid injections today she asked about this.  Discussed risks.  Skin was cleaned Betadine, alcohol and mixture of 0.5 cc of dexamethasone phosphate, 0.5 cc of Marcaine plain was infiltrated to the first MPJ into and around the bunion area bilaterally.  She tolerated well.  Postinjection care discussed. -Discussed shoe modifications and offloading. -As a courtesy debride the calluses without any complications or bleeding.  Leah Rojas DPM

## 2022-02-08 NOTE — Patient Instructions (Signed)
While at your visit today you received a steroid injection in your foot or ankle to help with your pain. Along with having the steroid medication there is some "numbing" medication in the shot that you received. Due to this you may notice some numbness to the area for the next couple of hours.   I would recommend limiting activity for the next few days to help the steroid injection take affect.    The actually benefit from the steroid injection may take up to 2-7 days to see a difference. You may actually experience a small (as in 10%) INCREASE in pain in the first 24 hours---that is common. It would be best if you can ice the area today and take anti-inflammatory medications (such as Ibuprofen, Motrin, or Aleve) if you are able to take these medications. If you were prescribed another medication to help with the pain go ahead and start that medication today    Things to watch out for that you should contact us or a health care provider urgently would include: 1. Unusual (as in more than 10%) increase in pain 2. New fever > 101.5 3. New swelling or redness of the injected area.  4. Streaking of red lines around the area injected.  If you have any questions or concerns about this, please give our office a call at [phone].    Bunion A bunion (hallux valgus) is a bump that forms slowly on the inner side of the big toe joint. It occurs when the big toe turns toward the second toe. Bunions may be small at first, but they often get larger over time. They can make walking painful. What are the causes? This condition may be caused by: Wearing narrow or pointed shoes that force the big toe to press against the other toes. Abnormal foot development that causes the foot to roll inward. Changes in the foot that are caused by certain diseases, such as rheumatoid arthritis or polio. A foot injury. What increases the risk? The following factors may make you more likely to develop this  condition: Wearing shoes that squeeze the toes together. Having certain diseases, such as: Rheumatoid arthritis. Polio. Cerebral palsy. Having family members who have bunions. Being born with abnormally shaped feet (a foot deformity), such as flat feet or low arches. Doing activities that put a lot of pressure on the feet, such as ballet dancing. What are the signs or symptoms?  The main symptom of this condition is a bump on your big toe that you can notice. Other symptoms may include: Pain. Redness and inflammation around your big toe. Thick or hardened skin on your big toe or between your toes. Stiffness or loss of motion in your big toe. Trouble with walking. How is this diagnosed? This condition may be diagnosed based on your symptoms, medical history, and activities. You may also have tests and imaging, such as: X-rays. These allow your health care provider to check the position of the bones in your foot and look for damage to your joint. They also help your health care provider determine the severity of your bunion and the best way to treat it. Joint aspiration. In this test, a sample of fluid is removed from the toe joint. This test may be done if you are in a lot of pain. It helps rule out diseases that cause painful swelling of the joints, such as arthritis or gout. How is this treated? Treatment depends on the severity of your symptoms. The goal of   treatment is to relieve symptoms and prevent your bunion from getting worse. Your health care provider may recommend: Wearing shoes that have a wide toe box, or using bunion pads to cushion the affected area. Taping your toes together to keep them in a normal position. Placing a device inside your shoe (orthotic device) to help reduce pressure on your toe joint. Taking medicine to ease pain and inflammation. Putting ice or heat on the affected area. Doing stretching exercises. Surgery, for severe cases. Follow these instructions  at home: Managing pain, stiffness, and swelling     If directed, put ice on the painful area. To do this: Put ice in a plastic bag. Place a towel between your skin and the bag. Leave the ice on for 20 minutes, 2-3 times a day. Remove the ice if your skin turns bright red. This is very important. If you cannot feel pain, heat, or cold, you have a greater risk of damage to the area. If directed, apply heat to the affected area before you exercise. Use the heat source that your health care provider recommends, such as a moist heat pack or a heating pad. Place a towel between your skin and the heat source. Leave the heat on for 20-30 minutes. Remove the heat if your skin turns bright red. This is especially important if you are unable to feel pain, heat, or cold. You have a greater risk of getting burned. General instructions Do exercises as told by your health care provider. Support your toe joint with proper footwear, shoe padding, or taping as told by your health care provider. Take over-the-counter and prescription medicines only as told by your health care provider. Do not use any products that contain nicotine or tobacco, such as cigarettes, e-cigarettes, and chewing tobacco. If you need help quitting, ask your health care provider. Keep all follow-up visits. This is important. Contact a health care provider if: Your symptoms get worse. Your symptoms do not improve in 2 weeks. Get help right away if: You have severe pain and trouble with walking. Summary A bunion is a bump on the inner side of the big toe joint that forms when the big toe turns toward the second toe. Bunions can make walking painful. Treatment depends on the severity of your symptoms. Support your toe joint with proper footwear, shoe padding, or taping as told by your health care provider. This information is not intended to replace advice given to you by your health care provider. Make sure you discuss any questions  you have with your health care provider. Document Revised: 01/17/2020 Document Reviewed: 01/17/2020 [employer] Patient Education  2023 [employer]  

## 2022-02-22 ENCOUNTER — Other Ambulatory Visit: Payer: Self-pay | Admitting: Podiatry

## 2022-02-22 DIAGNOSIS — M21619 Bunion of unspecified foot: Secondary | ICD-10-CM

## 2022-05-31 ENCOUNTER — Ambulatory Visit: Payer: Medicare HMO | Attending: Family Medicine | Admitting: Family Medicine

## 2022-05-31 ENCOUNTER — Encounter: Payer: Self-pay | Admitting: Family Medicine

## 2022-05-31 VITALS — BP 119/81 | HR 86 | Temp 98.4°F | Ht 65.0 in | Wt 171.4 lb

## 2022-05-31 DIAGNOSIS — Z1159 Encounter for screening for other viral diseases: Secondary | ICD-10-CM

## 2022-05-31 DIAGNOSIS — Z131 Encounter for screening for diabetes mellitus: Secondary | ICD-10-CM | POA: Diagnosis not present

## 2022-05-31 DIAGNOSIS — Z87820 Personal history of traumatic brain injury: Secondary | ICD-10-CM

## 2022-05-31 DIAGNOSIS — J3089 Other allergic rhinitis: Secondary | ICD-10-CM | POA: Diagnosis not present

## 2022-05-31 DIAGNOSIS — F329 Major depressive disorder, single episode, unspecified: Secondary | ICD-10-CM

## 2022-05-31 DIAGNOSIS — E876 Hypokalemia: Secondary | ICD-10-CM

## 2022-05-31 NOTE — Patient Instructions (Signed)
Exercising to Stay Healthy To become healthy and stay healthy, it is recommended that you do moderate-intensity and vigorous-intensity exercise. You can tell that you are exercising at a moderate intensity if your heart starts beating faster and you start breathing faster but can still hold a conversation. You can tell that you are exercising at a vigorous intensity if you are breathing much harder and faster and cannot hold a conversation while exercising. How can exercise benefit me? Exercising regularly is important. It has many health benefits, such as: Improving overall fitness, flexibility, and endurance. Increasing bone density. Helping with weight control. Decreasing body fat. Increasing muscle strength and endurance. Reducing stress and tension, anxiety, depression, or anger. Improving overall health. What guidelines should I follow while exercising? Before you start a new exercise program, talk with your health care provider. Do not exercise so much that you hurt yourself, feel dizzy, or get very short of breath. Wear comfortable clothes and wear shoes with good support. Drink plenty of water while you exercise to prevent dehydration or heat stroke. Work out until your breathing and your heartbeat get faster (moderate intensity). How often should I exercise? Choose an activity that you enjoy, and set realistic goals. Your health care provider can help you make an activity plan that is individually designed and works best for you. Exercise regularly as told by your health care provider. This may include: Doing strength training two times a week, such as: Lifting weights. Using resistance bands. Push-ups. Sit-ups. Yoga. Doing a certain intensity of exercise for a given amount of time. Choose from these options: A total of 150 minutes of moderate-intensity exercise every week. A total of 75 minutes of vigorous-intensity exercise every week. A mix of moderate-intensity and  vigorous-intensity exercise every week. Children, pregnant women, people who have not exercised regularly, people who are overweight, and older adults may need to talk with a health care provider about what activities are safe to perform. If you have a medical condition, be sure to talk with your health care provider before you start a new exercise program. What are some exercise ideas? Moderate-intensity exercise ideas include: Walking 1 mile (1.6 km) in about 15 minutes. Biking. Hiking. Golfing. Dancing. Water aerobics. Vigorous-intensity exercise ideas include: Walking 4.5 miles (7.2 km) or more in about 1 hour. Jogging or running 5 miles (8 km) in about 1 hour. Biking 10 miles (16.1 km) or more in about 1 hour. Lap swimming. Roller-skating or in-line skating. Cross-country skiing. Vigorous competitive sports, such as football, basketball, and soccer. Jumping rope. Aerobic dancing. What are some everyday activities that can help me get exercise? Yard work, such as: Pushing a lawn mower. Raking and bagging leaves. Washing your car. Pushing a stroller. Shoveling snow. Gardening. Washing windows or floors. How can I be more active in my day-to-day activities? Use stairs instead of an elevator. Take a walk during your lunch break. If you drive, park your car farther away from your work or school. If you take public transportation, get off one stop early and walk the rest of the way. Stand up or walk around during all of your indoor phone calls. Get up, stretch, and walk around every 30 minutes throughout the day. Enjoy exercise with a friend. Support to continue exercising will help you keep a regular routine of activity. Where to find more information You can find more information about exercising to stay healthy from: U.S. Department of Health and Human Services: www.hhs.gov Centers for Disease Control and Prevention (  CDC): www.cdc.gov Summary Exercising regularly is  important. It will improve your overall fitness, flexibility, and endurance. Regular exercise will also improve your overall health. It can help you control your weight, reduce stress, and improve your bone density. Do not exercise so much that you hurt yourself, feel dizzy, or get very short of breath. Before you start a new exercise program, talk with your health care provider. This information is not intended to replace advice given to you by your health care provider. Make sure you discuss any questions you have with your health care provider. Document Revised: 01/08/2021 Document Reviewed: 01/08/2021 Elsevier Patient Education  2023 Elsevier Inc.  
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
## 2022-06-01 LAB — BASIC METABOLIC PANEL
BUN/Creatinine Ratio: 23 (ref 9–23)
BUN: 15 mg/dL (ref 6–20)
CO2: 23 mmol/L (ref 20–29)
Calcium: 9.4 mg/dL (ref 8.7–10.2)
Chloride: 106 mmol/L (ref 96–106)
Creatinine, Ser: 0.65 mg/dL (ref 0.57–1.00)
Glucose: 67 mg/dL — ABNORMAL LOW (ref 70–99)
Potassium: 4.5 mmol/L (ref 3.5–5.2)
Sodium: 142 mmol/L (ref 134–144)
eGFR: 120 mL/min/{1.73_m2} (ref >59)

## 2022-06-01 LAB — CBC WITH DIFFERENTIAL/PLATELET
Basophils Absolute: 0 10*3/uL (ref 0.0–0.2)
Basos: 0 %
EOS (ABSOLUTE): 0.1 10*3/uL (ref 0.0–0.4)
Eos: 2 %
Hematocrit: 34 % (ref 34.0–46.6)
Hemoglobin: 11.4 g/dL (ref 11.1–15.9)
Immature Grans (Abs): 0 10*3/uL (ref 0.0–0.1)
Immature Granulocytes: 0 %
Lymphocytes Absolute: 1.6 10*3/uL (ref 0.7–3.1)
Lymphs: 22 %
MCH: 31.3 pg (ref 26.6–33.0)
MCHC: 33.5 g/dL (ref 31.5–35.7)
MCV: 93 fL (ref 79–97)
Monocytes Absolute: 0.5 10*3/uL (ref 0.1–0.9)
Monocytes: 8 %
Neutrophils Absolute: 4.8 10*3/uL (ref 1.4–7.0)
Neutrophils: 68 %
Platelets: 338 10*3/uL (ref 150–450)
RBC: 3.64 x10E6/uL — ABNORMAL LOW (ref 3.77–5.28)
RDW: 12.9 % (ref 11.7–15.4)
WBC: 7.1 10*3/uL (ref 3.4–10.8)

## 2022-06-01 LAB — HEMOGLOBIN A1C
Est. average glucose Bld gHb Est-mCnc: 100 mg/dL
Hgb A1c MFr Bld: 5.1 % (ref 4.8–5.6)

## 2022-06-01 LAB — HCV INTERPRETATION

## 2022-06-01 LAB — HCV AB W REFLEX TO QUANT PCR: HCV Ab: NONREACTIVE

## 2022-06-29 ENCOUNTER — Encounter: Payer: Medicare HMO | Attending: Physical Medicine & Rehabilitation | Admitting: Physical Medicine & Rehabilitation

## 2022-06-29 ENCOUNTER — Encounter: Payer: Self-pay | Admitting: Physical Medicine & Rehabilitation

## 2022-06-29 VITALS — BP 118/76 | HR 77 | Ht 65.0 in | Wt 168.8 lb

## 2022-06-29 DIAGNOSIS — M7918 Myalgia, other site: Secondary | ICD-10-CM | POA: Insufficient documentation

## 2022-06-29 DIAGNOSIS — R4689 Other symptoms and signs involving appearance and behavior: Secondary | ICD-10-CM | POA: Diagnosis present

## 2022-06-29 DIAGNOSIS — S069X0S Unspecified intracranial injury without loss of consciousness, sequela: Secondary | ICD-10-CM | POA: Insufficient documentation

## 2022-06-29 MED ORDER — SERTRALINE HCL 25 MG PO TABS: 25.0000 mg | ORAL_TABLET | Freq: Every day | ORAL | 4 refills | Status: DC
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
## 2022-06-29 NOTE — Patient Instructions (Addendum)
PLEASE FEEL FREE TO CALL OUR OFFICE WITH ANY PROBLEMS OR QUESTIONS ([phone])                    THINK ABOUT SAFETY!   Vision check and wear your glasses in the mean time.   IBUPROFEN 400-600MG  FOR HEADACHES WITH FOOD
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
## 2022-07-08 ENCOUNTER — Other Ambulatory Visit: Payer: Self-pay

## 2022-07-08 ENCOUNTER — Ambulatory Visit: Payer: Medicare HMO | Attending: Physical Medicine & Rehabilitation

## 2022-07-08 DIAGNOSIS — M7918 Myalgia, other site: Secondary | ICD-10-CM | POA: Diagnosis not present

## 2022-07-08 DIAGNOSIS — M542 Cervicalgia: Secondary | ICD-10-CM | POA: Insufficient documentation

## 2022-07-08 DIAGNOSIS — R252 Cramp and spasm: Secondary | ICD-10-CM | POA: Diagnosis present
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
## 2022-07-19 ENCOUNTER — Ambulatory Visit: Payer: Medicare HMO

## 2022-07-19 DIAGNOSIS — M542 Cervicalgia: Secondary | ICD-10-CM | POA: Diagnosis not present

## 2022-07-19 DIAGNOSIS — R252 Cramp and spasm: Secondary | ICD-10-CM
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
## 2022-07-20 ENCOUNTER — Ambulatory Visit: Payer: Medicare HMO

## 2022-07-20 DIAGNOSIS — R252 Cramp and spasm: Secondary | ICD-10-CM

## 2022-07-20 DIAGNOSIS — M542 Cervicalgia: Secondary | ICD-10-CM | POA: Diagnosis not present

## 2022-07-20 NOTE — Patient Instructions (Signed)
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
## 2022-07-26 ENCOUNTER — Ambulatory Visit: Payer: Medicare HMO

## 2022-07-26 DIAGNOSIS — M542 Cervicalgia: Secondary | ICD-10-CM

## 2022-07-26 DIAGNOSIS — R252 Cramp and spasm: Secondary | ICD-10-CM

## 2022-07-28 ENCOUNTER — Ambulatory Visit: Payer: Medicare HMO | Attending: Physical Medicine & Rehabilitation

## 2022-07-28 DIAGNOSIS — M542 Cervicalgia: Secondary | ICD-10-CM | POA: Diagnosis not present

## 2022-07-28 DIAGNOSIS — R252 Cramp and spasm: Secondary | ICD-10-CM | POA: Diagnosis present
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
## 2022-08-02 ENCOUNTER — Ambulatory Visit: Payer: Medicare HMO

## 2022-08-02 DIAGNOSIS — R252 Cramp and spasm: Secondary | ICD-10-CM

## 2022-08-02 DIAGNOSIS — M542 Cervicalgia: Secondary | ICD-10-CM | POA: Diagnosis not present
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
## 2022-08-04 ENCOUNTER — Ambulatory Visit: Payer: Medicare HMO

## 2022-08-04 DIAGNOSIS — M542 Cervicalgia: Secondary | ICD-10-CM | POA: Diagnosis not present

## 2022-08-04 DIAGNOSIS — R252 Cramp and spasm: Secondary | ICD-10-CM

## 2022-08-08 ENCOUNTER — Ambulatory Visit: Payer: Medicare HMO | Admitting: Physical Therapy
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
## 2022-08-10 ENCOUNTER — Telehealth: Payer: Self-pay

## 2022-08-10 ENCOUNTER — Ambulatory Visit: Payer: Medicare HMO

## 2022-08-10 NOTE — Telephone Encounter (Signed)
Called pt re: no show appt. No answer and was not able to leave message.

## 2022-08-15 ENCOUNTER — Encounter: Payer: Self-pay | Admitting: Physical Therapy

## 2022-08-15 ENCOUNTER — Ambulatory Visit: Payer: Medicare HMO | Admitting: Physical Therapy

## 2022-08-15 DIAGNOSIS — M542 Cervicalgia: Secondary | ICD-10-CM

## 2022-08-15 DIAGNOSIS — R252 Cramp and spasm: Secondary | ICD-10-CM
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
## 2022-08-16 ENCOUNTER — Ambulatory Visit: Payer: Medicare HMO

## 2022-08-16 DIAGNOSIS — M542 Cervicalgia: Secondary | ICD-10-CM

## 2022-08-16 DIAGNOSIS — R252 Cramp and spasm: Secondary | ICD-10-CM
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
## 2022-09-14 ENCOUNTER — Encounter (HOSPITAL_COMMUNITY): Payer: Self-pay

## 2022-09-14 ENCOUNTER — Ambulatory Visit (HOSPITAL_COMMUNITY)
Admission: EM | Admit: 2022-09-14 | Discharge: 2022-09-14 | Disposition: A | Discharge status: Home / Self Care | Payer: Medicare HMO | Attending: Family Medicine | Admitting: Family Medicine

## 2022-09-14 DIAGNOSIS — J101 Influenza due to other identified influenza virus with other respiratory manifestations: Secondary | ICD-10-CM | POA: Diagnosis not present

## 2022-09-14 LAB — POC INFLUENZA A AND B ANTIGEN (URGENT CARE ONLY)
INFLUENZA A ANTIGEN, POC: NEGATIVE
INFLUENZA B ANTIGEN, POC: POSITIVE — AB

## 2022-09-14 MED ORDER — OSELTAMIVIR PHOSPHATE 75 MG PO CAPS: 75.0000 mg | ORAL_CAPSULE | Freq: Two times a day (BID) | ORAL | 0 refills | Status: AC

## 2022-09-14 MED ORDER — HYDROCODONE BIT-HOMATROP MBR 5-1.5 MG/5ML PO SOLN: 5.0000 mL | Freq: Four times a day (QID) | ORAL | 0 refills | Status: DC | PRN

## 2022-09-14 NOTE — ED Triage Notes (Signed)
Pt complains of eye pain , sore throat gas, diarrhea, cough, chest congestion , chills, fever, frequent urination, fatigue , lost of appetite .

## 2022-09-14 NOTE — Discharge Instructions (Signed)
Be aware, your cough medication may cause drowsiness. Please do not drive, operate heavy machinery or make important decisions while on this medication as it may cloud your judgement.  

## 2022-09-15 NOTE — ED Provider Notes (Signed)
[facility]   [id number] 09/14/22 Arrival Time: 1823  ASSESSMENT & PLAN:  1. Influenza B    Discussed typical duration of a viral illness. OTC symptom care as needed.  Discharge Medication List as of 09/14/2022  9:00 PM     START taking these medications   Details  HYDROcodone bit-homatropine (HYCODAN) 5-1.5 MG/5ML syrup Take 5 mLs by mouth every 6 (six) hours as needed for cough., Starting Wed 09/14/2022, Normal    oseltamivir (TAMIFLU) 75 MG capsule Take 1 capsule (75 mg total) by mouth 2 (two) times daily for 5 days., Starting Wed 09/14/2022, Until Mon 09/19/2022, Normal         Follow-up Information     Grayce Sessions, NP.   Specialty: Internal Medicine Why: As needed. Contact information: [address] [place] [address] [phone]         [facility].   Specialty: Urgent Care Why: If worsening or failing to improve as anticipated. Contact information: [address] [place] [address] [phone]                Reviewed expectations re: course of current medical issues. Questions answered. Outlined signs and symptoms indicating need for more acute intervention. Understanding verbalized. After Visit Summary given.   SUBJECTIVE: History from: Patient. Shailey Butterbaugh is a 31 y.o. female. Reports: Pt complains of watery eyes, sore throat, gas, diarrhea, cough, chest congestion , chills, fever, frequent urination, fatigue , lost of appetite. Abrupt onset; few days. Child with same last week. Denies: difficulty breathing. Normal PO intake without n/v/d.  OBJECTIVE:  Vitals:   09/14/22 2041  BP: 131/81  Pulse: 96  Resp: 16  Temp: 100 F (37.8 C)  TempSrc: Oral  SpO2: 97%    General appearance: alert; no distress Eyes: PERRLA; EOMI; conjunctiva normal HENT: ; AT; with nasal congestion Neck: supple  Lungs: speaks full sentences without difficulty; unlabored; clear  bilat Extremities: no edema Skin: warm and dry Neurologic: normal gait Psychological: alert and cooperative; normal mood and affect  Labs: Results for orders placed or performed during the hospital encounter of 09/14/22  POC Influenza A & B Ag (Urgent Care Only)  Result Value Ref Range   INFLUENZA A ANTIGEN, POC NEGATIVE NEGATIVE   INFLUENZA B ANTIGEN, POC POSITIVE (A) NEGATIVE   Labs Reviewed  POC INFLUENZA A AND B ANTIGEN (URGENT CARE ONLY) - Abnormal; Notable for the following components:      Result Value   INFLUENZA B ANTIGEN, POC POSITIVE (*)    All other components within normal limits    Imaging: No results found.  No Known Allergies  Past Medical History:  Diagnosis Date   Bronchitis    rescue inhaler prn   Traumatic brain injury [facility])    Social History   Socioeconomic History   Marital status: Single    Spouse name: Not on file   Number of children: Not on file   Years of education: High school   Highest education level: Some college, no degree  Occupational History   Not on file  Tobacco Use   Smoking status: Former    Packs/day: 0.25    Types: Cigarettes    Quit date: 12/18/2014    Years since quitting: 7.7   Smokeless tobacco: Never  Vaping Use   Vaping Use: Never used  Substance and Sexual Activity   Alcohol use: No    Alcohol/week: 0.0 standard drinks of alcohol   Drug  use: No   Sexual activity: Yes    Birth control/protection: None  Other Topics Concern   Not on file  Social History Narrative   Not on file   Social Determinants of Health   Financial Resource Strain: Not on file  Food Insecurity: Not on file  Transportation Needs: Not on file  Physical Activity: Not on file  Stress: Not on file  Social Connections: Not on file  Intimate Partner Violence: Not on file   Family History  Problem Relation Age of Onset   Heart Problems Mother    Mental retardation Cousin    Other Neg Hx    Colon cancer Neg Hx    Esophageal cancer  Neg Hx    Stomach cancer Neg Hx    Pancreatic cancer Neg Hx    Colon polyps Neg Hx    Diabetes Neg Hx    Kidney disease Neg Hx    Liver disease Neg Hx    Past Surgical History:  Procedure Laterality Date   NO PAST SURGERIES       Mardella Layman, MD 09/15/22 [phone]

## 2022-11-29 ENCOUNTER — Encounter: Payer: Self-pay | Admitting: Family Medicine

## 2022-11-29 ENCOUNTER — Other Ambulatory Visit: Payer: Self-pay | Admitting: Physical Medicine & Rehabilitation

## 2022-11-29 ENCOUNTER — Ambulatory Visit: Payer: Medicare HMO | Attending: Family Medicine | Admitting: Family Medicine

## 2022-11-29 VITALS — BP 108/68 | HR 95 | Ht 64.0 in | Wt 175.6 lb

## 2022-11-29 DIAGNOSIS — Z87820 Personal history of traumatic brain injury: Secondary | ICD-10-CM | POA: Insufficient documentation

## 2022-11-29 DIAGNOSIS — N3000 Acute cystitis without hematuria: Secondary | ICD-10-CM

## 2022-11-29 DIAGNOSIS — Z87891 Personal history of nicotine dependence: Secondary | ICD-10-CM | POA: Insufficient documentation

## 2022-11-29 DIAGNOSIS — H65192 Other acute nonsuppurative otitis media, left ear: Secondary | ICD-10-CM | POA: Diagnosis not present

## 2022-11-29 DIAGNOSIS — Z0000 Encounter for general adult medical examination without abnormal findings: Secondary | ICD-10-CM | POA: Diagnosis present

## 2022-11-29 LAB — POCT URINALYSIS DIP (CLINITEK)
Bilirubin, UA: NEGATIVE
Blood, UA: NEGATIVE
Glucose, UA: NEGATIVE mg/dL
Ketones, POC UA: NEGATIVE mg/dL
Nitrite, UA: POSITIVE — AB
POC PROTEIN,UA: 30 — AB
Spec Grav, UA: 1.025
Urobilinogen, UA: 0.2 U/dL
pH, UA: 7

## 2022-11-29 MED ORDER — AMOXICILLIN-POT CLAVULANATE 875-125 MG PO TABS: 1.0000 | ORAL_TABLET | Freq: Two times a day (BID) | ORAL | 0 refills | Status: DC

## 2022-11-29 NOTE — Progress Notes (Signed)
Medicare Wellness Congestion Painful urination.

## 2022-11-29 NOTE — Patient Instructions (Signed)
  Ms. Ocker , Thank you for taking time to come for your Medicare Wellness Visit. I appreciate your ongoing commitment to your health goals. Please review the following plan we discussed and let me know if I can assist you in the future.   These are the goals we discussed:  Goals      Exercise 150 min/wk Moderate Activity        This is a list of the screening recommended for you and due dates:  Health Maintenance  Topic Date Due   COVID-19 Vaccine (1) 12/15/2022*   Flu Shot  12/25/2022*   DTaP/Tdap/Td vaccine (2 - Td or Tdap) 12/07/2022   Pap Smear  04/08/2023   Medicare Annual Wellness Visit  11/29/2023   Hepatitis C Screening: USPSTF Recommendation to screen - Ages 18-79 yo.  Completed   HIV Screening  Completed   HPV Vaccine  Aged Out  *Topic was postponed. The date shown is not the original due date.
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
## 2022-12-02 LAB — URINE CULTURE

## 2022-12-07 ENCOUNTER — Telehealth: Payer: Self-pay | Admitting: Physical Medicine & Rehabilitation

## 2022-12-07 NOTE — Telephone Encounter (Signed)
The patients mother called and said they need Dr. Naaman Plummer to do a note for daycare she said he does it for them every year.

## 2022-12-12 ENCOUNTER — Encounter (HOSPITAL_COMMUNITY): Payer: Self-pay | Admitting: Physical Medicine & Rehabilitation

## 2022-12-12 NOTE — Telephone Encounter (Signed)
Printed and placed on Dr Naaman Plummer desk to be signed. The will pick it up  on Thursday.

## 2022-12-22 ENCOUNTER — Ambulatory Visit (INDEPENDENT_AMBULATORY_CARE_PROVIDER_SITE_OTHER): Payer: Medicare HMO

## 2022-12-22 ENCOUNTER — Ambulatory Visit (INDEPENDENT_AMBULATORY_CARE_PROVIDER_SITE_OTHER): Payer: Medicare HMO | Admitting: Podiatry

## 2022-12-22 DIAGNOSIS — M21619 Bunion of unspecified foot: Secondary | ICD-10-CM | POA: Diagnosis not present

## 2022-12-22 DIAGNOSIS — M21611 Bunion of right foot: Secondary | ICD-10-CM

## 2022-12-22 DIAGNOSIS — M778 Other enthesopathies, not elsewhere classified: Secondary | ICD-10-CM | POA: Diagnosis not present

## 2022-12-22 NOTE — Progress Notes (Signed)
Subjective: Chief Complaint  Patient presents with   Bunions    Bunion, right foot, patient has had the bunion for a few years and now starting to cause problems    32 year old female presents the office today for reoccurrence of pain in the right bunion and requesting another injection.  She also states that she like to talk more about surgical options for her bunion.  The bunion has been hurting with pressure in her shoes.  She tried offloading, padding without significant improvement.  Injection was helpful previously.  No tobacco use No hisotry of blood clots   Objective: AAO x3, NAD DP/PT pulses palpable bilaterally, CRT less than 3 seconds Moderate severe bunion present on the right foot there is tenderness palpation directly on the medial eminence of the first metatarsal head along the first MPJ.  No pain or crepitation with MPJ range of motion. No pain with calf compression, swelling, warmth, erythema  Assessment: 32 year old female with bunion deformity right foot, capsulitis  Plan: -All treatment options discussed with the patient including all alternatives, risks, complications.  -X-rays were obtained reviewed of the right foot.  3 views were obtained.  Severe 1st/2nd metatarsal angle with an angle of 17 degrees.  No evidence of acute fracture. -She was proceed with steroid injection.  Mixture of 0.5 cc of Marcaine plain, 0.5 cc of dexamethasone phosphate was infiltrated into around the first MPJ without complications along the area of the medial eminence.  She tolerated procedure well -We discussed the conservative as well as surgical options.  Ultimately discussed Lapidus, possible Akin osteotomy and she wants to proceed with this. -The incision placement as well as the postoperative course was discussed with the patient. I discussed risks of the surgery which include, but not limited to, infection, bleeding, pain, swelling, need for further surgery, delayed or nonhealing,  painful or ugly scar, numbness or sensation changes, over/under correction, recurrence, transfer lesions, further deformity, hardware failure, DVT/PE, loss of toe/foot. Patient understands these risks and wishes to proceed with surgery. The surgical consent was reviewed with the patient all 3 pages were signed. No promises or guarantees were given to the outcome of the procedure. All questions were answered to the best of my ability. Before the surgery the patient was encouraged to call the office if there is any further questions. The surgery will be performed at the [facility] on an outpatient basis. -Patient encouraged to call the office with any questions, concerns, change in symptoms.   Trula Slade DPM

## 2022-12-22 NOTE — Patient Instructions (Signed)
Pre-Operative Instructions  Congratulations, you have decided to take an important step to improving your quality of life.  You can be assured that the doctors of [facility] will be with you every step of the way.  Plan to be at the surgery center/hospital at least 1 (one) hour prior to your scheduled time unless otherwise directed by the surgical center/hospital staff.  You must have a responsible adult accompany you, remain during the surgery and drive you home.  Make sure you have directions to the surgical center/hospital and know how to get there on time. For hospital based surgery you will need to obtain a history and physical form from your family physician within 1 month prior to the date of surgery- we will give you a form for you primary physician.  We make every effort to accommodate the date you request for surgery.  There are however, times where surgery dates or times have to be moved.  We will contact you as soon as possible if a change in schedule is required.   No Aspirin/Ibuprofen for one week before surgery.  If you are on aspirin, any non-steroidal anti-inflammatory medications (Mobic, Aleve, Ibuprofen) you should stop taking it 7 days prior to your surgery.  You make take Tylenol  For pain prior to surgery.  Medications- If you are taking daily heart and blood pressure medications, seizure, reflux, allergy, asthma, anxiety, pain or diabetes medications, make sure the surgery center/hospital is aware before the day of surgery so they may notify you which medications to take or avoid the day of surgery. No food or drink after midnight the night before surgery unless directed otherwise by surgical center/hospital staff. No alcoholic beverages 24 hours prior to surgery.  No smoking 24 hours prior to or 24 hours after surgery. Wear loose pants or shorts- loose enough to fit over bandages, boots, and casts. No slip on shoes, sneakers are best. Bring your boot with you to the  surgery center/hospital.  Also bring crutches or a walker if your physician has prescribed it for you.  If you do not have this equipment, it will be provided for you after surgery. If you have not been contracted by the surgery center/hospital by the day before your surgery, call to confirm the date and time of your surgery. Leave-time from work may vary depending on the type of surgery you have.  Appropriate arrangements should be made prior to surgery with your employer. Prescriptions will be provided immediately following surgery by your doctor.  Have these filled as soon as possible after surgery and take the medication as directed. Remove nail polish on the operative foot. Wash the night before surgery.  The night before surgery wash the foot and leg well with the antibacterial soap provided and water paying special attention to beneath the toenails and in between the toes.  Rinse thoroughly with water and dry well with a towel.  Perform this wash unless told not to do so by your physician.  Enclosed: 1 Ice pack (please put in freezer the night before surgery)   1 Hibiclens skin cleaner   Pre-op Instructions  If you have any questions regarding the instructions, do not hesitate to call our office at any point during this process.   [place]: [address] [place], [place] [address] [phone]  [place]: [address] [place], [place] [address] [phone]  Dr. Mithran Strike, DPM  

## 2023-01-04 ENCOUNTER — Encounter: Payer: Medicare HMO | Attending: Physical Medicine & Rehabilitation | Admitting: Physical Medicine & Rehabilitation

## 2023-01-04 ENCOUNTER — Encounter: Payer: Self-pay | Admitting: Physical Medicine & Rehabilitation

## 2023-01-04 VITALS — BP 124/82 | HR 70 | Ht 64.0 in | Wt 178.0 lb

## 2023-01-04 DIAGNOSIS — S06306S Unspecified focal traumatic brain injury with loss of consciousness greater than 24 hours without return to pre-existing conscious level with patient surviving, sequela: Secondary | ICD-10-CM | POA: Diagnosis present

## 2023-01-04 DIAGNOSIS — M7918 Myalgia, other site: Secondary | ICD-10-CM | POA: Diagnosis present

## 2023-01-04 DIAGNOSIS — R2689 Other abnormalities of gait and mobility: Secondary | ICD-10-CM

## 2023-01-04 DIAGNOSIS — W3400XS Accidental discharge from unspecified firearms or gun, sequela: Secondary | ICD-10-CM

## 2023-01-04 DIAGNOSIS — R519 Headache, unspecified: Secondary | ICD-10-CM

## 2023-01-04 MED ORDER — SERTRALINE HCL 50 MG PO TABS: 50.0000 mg | ORAL_TABLET | Freq: Every day | ORAL | 3 refills | Status: DC

## 2023-01-04 NOTE — Patient Instructions (Signed)
ALWAYS FEEL FREE TO CALL OUR OFFICE WITH ANY PROBLEMS OR QUESTIONS ([phone])  **PLEASE NOTE** ALL MEDICATION REFILL REQUESTS (INCLUDING CONTROLLED SUBSTANCES) NEED TO BE MADE AT LEAST 7 DAYS PRIOR TO REFILL BEING DUE. ANY REFILL REQUESTS INSIDE THAT TIME FRAME MAY RESULT IN DELAYS IN RECEIVING YOUR PRESCRIPTION.                    
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
## 2023-01-09 ENCOUNTER — Ambulatory Visit: Payer: Medicaid Other | Admitting: Family Medicine

## 2023-01-31 ENCOUNTER — Telehealth: Payer: Self-pay | Admitting: Urology

## 2023-01-31 NOTE — Telephone Encounter (Addendum)
DOS - 02/22/23  [facility] RIGHT --- [id number] INCLUDING BUNIONECTOMY RIGHT --- [phone] [facility] NAIL PERM LEFT --- [id number]  [employer]   PER [place] WEBSITE FOR CPT CODES 09811 AND [phone] HAS BEEN APPROVED, AUTH # [id number], GOOD FROM 02/22/23 - 04/24/23.  Tracking # [id number]  Created on April 01, 2023  The following codes do not require a pre-authorization All services are subject to members benefits, exclusions, limitations and other applicable conditions.   [employer] Membership type Medicare Plan Year 09/26/2022 - 09/25/9998  Service info [id number] Excision of nail and nail matrix, partial or complete (eg, ingrown or deformed nail), for permanent removal  Tracking #[id number]

## 2023-02-15 ENCOUNTER — Encounter (HOSPITAL_BASED_OUTPATIENT_CLINIC_OR_DEPARTMENT_OTHER): Payer: Self-pay | Admitting: Emergency Medicine

## 2023-02-15 ENCOUNTER — Emergency Department (HOSPITAL_BASED_OUTPATIENT_CLINIC_OR_DEPARTMENT_OTHER): Payer: Medicare HMO

## 2023-02-15 ENCOUNTER — Other Ambulatory Visit: Payer: Self-pay

## 2023-02-15 ENCOUNTER — Other Ambulatory Visit (HOSPITAL_BASED_OUTPATIENT_CLINIC_OR_DEPARTMENT_OTHER): Payer: Self-pay

## 2023-02-15 ENCOUNTER — Emergency Department (HOSPITAL_BASED_OUTPATIENT_CLINIC_OR_DEPARTMENT_OTHER): Payer: Medicare HMO | Admitting: Radiology

## 2023-02-15 ENCOUNTER — Emergency Department (HOSPITAL_BASED_OUTPATIENT_CLINIC_OR_DEPARTMENT_OTHER)
Admission: EM | Admit: 2023-02-15 | Discharge: 2023-02-15 | Disposition: A | Discharge status: Home / Self Care | Payer: Medicare HMO | Attending: Emergency Medicine | Admitting: Emergency Medicine

## 2023-02-15 DIAGNOSIS — R0789 Other chest pain: Secondary | ICD-10-CM | POA: Insufficient documentation

## 2023-02-15 DIAGNOSIS — M272 Inflammatory conditions of jaws: Secondary | ICD-10-CM | POA: Diagnosis not present

## 2023-02-15 DIAGNOSIS — R1310 Dysphagia, unspecified: Secondary | ICD-10-CM | POA: Diagnosis present

## 2023-02-15 DIAGNOSIS — R220 Localized swelling, mass and lump, head: Secondary | ICD-10-CM

## 2023-02-15 LAB — CBC
HCT: 35.2 % — ABNORMAL LOW (ref 36.0–46.0)
Hemoglobin: 11.8 g/dL — ABNORMAL LOW (ref 12.0–15.0)
MCH: 31.2 pg (ref 26.0–34.0)
MCHC: 33.5 g/dL (ref 30.0–36.0)
MCV: 93.1 fL (ref 80.0–100.0)
Platelets: 329 10*3/uL (ref 150–400)
RBC: 3.78 MIL/uL — ABNORMAL LOW (ref 3.87–5.11)
RDW: 14.1 % (ref 11.5–15.5)
WBC: 7.7 10*3/uL (ref 4.0–10.5)
nRBC: 0 % (ref 0.0–0.2)

## 2023-02-15 LAB — URINALYSIS, ROUTINE W REFLEX MICROSCOPIC
Bilirubin Urine: NEGATIVE
Glucose, UA: NEGATIVE mg/dL
Hgb urine dipstick: NEGATIVE
Ketones, ur: 15 mg/dL — AB
Nitrite: NEGATIVE
Specific Gravity, Urine: 1.032 — ABNORMAL HIGH (ref 1.005–1.030)
pH: 6.5 (ref 5.0–8.0)

## 2023-02-15 LAB — COMPREHENSIVE METABOLIC PANEL
ALT: 11 U/L (ref 0–44)
AST: 13 U/L — ABNORMAL LOW (ref 15–41)
Albumin: 4.4 g/dL (ref 3.5–5.0)
Alkaline Phosphatase: 37 U/L — ABNORMAL LOW (ref 38–126)
Anion gap: 7 (ref 5–15)
BUN: 19 mg/dL (ref 6–20)
CO2: 23 mmol/L (ref 22–32)
Calcium: 8.5 mg/dL — ABNORMAL LOW (ref 8.9–10.3)
Chloride: 106 mmol/L (ref 98–111)
Creatinine, Ser: 0.59 mg/dL (ref 0.44–1.00)
GFR, Estimated: >60 mL/min (ref >60)
Glucose, Bld: 75 mg/dL (ref 70–99)
Potassium: 3.9 mmol/L (ref 3.5–5.1)
Sodium: 136 mmol/L (ref 135–145)
Total Bilirubin: 0.5 mg/dL (ref 0.3–1.2)
Total Protein: 7.3 g/dL (ref 6.5–8.1)

## 2023-02-15 LAB — LIPASE, BLOOD: Lipase: 16 U/L (ref 11–51)

## 2023-02-15 LAB — PREGNANCY, URINE: Preg Test, Ur: NEGATIVE

## 2023-02-15 MED ORDER — IOHEXOL 300 MG/ML  SOLN
100.0000 mL | Freq: Once | INTRAMUSCULAR | Status: AC | PRN
  Administered 2023-02-15: 75 mL via INTRAVENOUS

## 2023-02-15 MED ORDER — ALUM & MAG HYDROXIDE-SIMETH 200-200-20 MG/5ML PO SUSP
30.0000 mL | Freq: Once | ORAL | Status: AC
  Administered 2023-02-15: 30 mL via ORAL
  Filled 2023-02-15: qty 30

## 2023-02-15 MED ORDER — OMEPRAZOLE 40 MG PO CPDR: 40.0000 mg | DELAYED_RELEASE_CAPSULE | Freq: Every day | ORAL | 0 refills | Status: DC

## 2023-02-15 MED ORDER — LIDOCAINE VISCOUS HCL 2 % MT SOLN
15.0000 mL | Freq: Once | OROMUCOSAL | Status: AC
  Administered 2023-02-15: 15 mL via ORAL
  Filled 2023-02-15: qty 15

## 2023-02-15 NOTE — ED Triage Notes (Signed)
Pt arrives pov, steady gait, reports difficulty swallowing x 1 week. Reports LT side neck swelling last night that has resolved. Speech clear. Pt endorses epigastric pain. Endorses self induced emesis d/t feeling like food gets stuck

## 2023-02-15 NOTE — Discharge Instructions (Addendum)
Apply warm compresses to sore area of your jaw for 20 minutes at a time. Gently massage area. Try lemon head candies.  Regarding the discomfort with swallowing/feeling like food is stuck in your chest- follow up with GI, please call to schedule an appointment. Try soft foods in small bites.

## 2023-02-15 NOTE — ED Notes (Signed)
ED Provider at bedside. 
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
## 2023-02-15 NOTE — ED Notes (Signed)
Pt. And her mom seem to be calm now.

## 2023-02-15 NOTE — ED Notes (Signed)
Shortly after having received the G.I. Cocktail, she had a right-sided submandibular pain flare. At all times her breathing was unlabored and she was speaking normally. She then completed her (in-department) x-ray. I informe pt. And her mother at the time of this pain that I would notify her provider (P.A. Vernona Rieger) and have her examine her. Upon her return from x-ray her mother came out of her room and loudly stated "I called and no one did anything". At that point, Leah Rojas re-examined her and found her to need no intervention, and she ordered a CT. Our C.N. spoke with pt. And her mom and we apologized for the perceived lack of action. We did explain that, since pt. Was breathing normally and speaking normally, there simply was no action needed.

## 2023-02-27 ENCOUNTER — Encounter: Payer: Medicare HMO | Admitting: Podiatry

## 2023-03-01 ENCOUNTER — Ambulatory Visit: Payer: Medicare HMO | Admitting: Physician Assistant

## 2023-03-01 NOTE — Progress Notes (Deleted)
Patient ID: Leah Rojas, female   DOB: 1991/03/05, 32 y.o.   MRN: [id number]   32 year old female with concern for foot bolus impaction on Monday, no history of similar previously, occurred while eating chicken. Reports feeling like it was stuck in her throat/chest, eventually has several episodes of vomiting and was able to clear/tolerate secretions/POs. Has intermittent chest discomfort since that time. Also notes right side submandibular swelling yesterday, swelling improved today but has discomfort in this area. No dental pain.  History of allergies, no history of eczema, asthma.   32 year old female presents with mom with concern for 2 separate problems.  Notes that on Monday she ate a piece of chicken and fell like it was stuck in her chest for a period of time, was eventually able to vomit and clear the area, has been tolerating p.o.'s since however reports intermittent ongoing chest discomfort.  In regards to this, she is tolerating secretions, fluids, solids and will be discharged, recommend small soft foods and follow-up with GI, call to schedule appointment.  Secondary complaint of right submandibular tonsillar area tenderness and swelling.  First noticed swelling yesterday, today the swelling had improved however she had pain in the area.  Initially on exam, there is no appreciable swelling in this area but she was tender.  Oropharynx is unremarkable, no obvious dental source.  Patient was provided with a GI cocktail for her chest complaint which caused worsening of pain and swelling through her right submandibular area.  Discussed with ER attending, consider possible salivary gland stone, sent for CT which is negative.  Patient was provided with a breakfast bar and water, eating the breakfast bar because worsening of the pain and swelling this area, consistent with salivary gland stone.  Will recommend warm compresses, gentle massage, lemon candies and follow-up with ENT. I ordered medication  including GI cocktail for chest discomfort with swallowing Reevaluation of the patient after these medicines showed that the patient worsened.  Resulted in worsening swelling and pain to the right submandibular area I have reviewed the patients home medicines and have made adjustments as needed     Social Determinants of Health:   Has PCP     Test / Admission - Considered:   Stable for discharge with referral to GI for concern with impacted food bolus.  Referred to ENT for salivary gland stone

## 2023-03-03 ENCOUNTER — Ambulatory Visit: Payer: Medicare HMO | Admitting: Podiatry

## 2023-03-09 ENCOUNTER — Encounter: Payer: Medicare HMO | Admitting: Podiatry

## 2023-03-10 ENCOUNTER — Encounter: Payer: Self-pay | Admitting: Podiatry

## 2023-03-10 ENCOUNTER — Ambulatory Visit (INDEPENDENT_AMBULATORY_CARE_PROVIDER_SITE_OTHER): Payer: Medicare HMO | Admitting: Podiatry

## 2023-03-10 DIAGNOSIS — L600 Ingrowing nail: Secondary | ICD-10-CM

## 2023-03-10 NOTE — Progress Notes (Unsigned)
Subjective: Chief Complaint  Patient presents with   Toe Pain    Hallux bilateral - medial border, gets regular pedicures, but noticed they start hurting a week after, bunion surgery scheduled for July 10th   32 year old female presents to the office for above concerns.  She states has been ingrown toenails for quite some time she has pedicures but despite this she is still getting pain.  No swelling, redness or drainage.  Objective: AAO x3, NAD DP/PT pulses palpable bilaterally, CRT less than 3 seconds Bunion unchanged. Incurvation present to both medial, lateral aspects of hallux toenails without any edema, erythema or signs of infection today.  There is tenderness palpation of the nail borders. No pain with calf compression, swelling, warmth, erythema  Assessment: Ingrown toenail b/l hallux  Plan: -All treatment options discussed with the patient including all alternatives, risks, complications.  -Discussed conservative versus surgical intervention.  She has tried routine debridement improvement.  We discussed partial nail avulsion she was proceed with this.  She would like to do this at the same time of surgery.  Hold this on the left side while she is undergoing her right bunion and implant of the right side later on.  Consent updated.  -Monitor for any clinical signs or symptoms of infection and directed to call the office immediately should any occur or go to the ER. -Patient encouraged to call the office with any questions, concerns, change in symptoms.   Vivi Barrack DPM

## 2023-03-10 NOTE — Patient Instructions (Signed)

## 2023-03-23 ENCOUNTER — Encounter: Payer: Medicare HMO | Admitting: Podiatry

## 2023-03-24 ENCOUNTER — Telehealth: Payer: Self-pay | Admitting: Podiatry

## 2023-03-24 ENCOUNTER — Encounter: Payer: Self-pay | Admitting: Podiatry

## 2023-03-24 NOTE — Telephone Encounter (Signed)
SPOKE WITH PT IN REGARDS TO RESCHEDULING SX. PT HAS BEEN HAVING SOME ISSUES SWALLOWING AND [facility] EMAILED WITH CONCERNS. ADVISED PT THAT WE WOULD SEND CONSENTS TO PCP FOR 07/08 VISIT AND SEND CONSENTS TO NEUROLOGIST. PT IS OK WITH RESCHEDULING TO 04/19/23 FROM 04/05/23 TO ALLOW TIME FOR CONSENTS TO BE RECEIVED AND REVIEWED.

## 2023-04-03 ENCOUNTER — Other Ambulatory Visit (HOSPITAL_COMMUNITY)
Admission: RE | Admit: 2023-04-03 | Discharge: 2023-04-03 | Disposition: A | Discharge status: Home / Self Care | Payer: Medicare HMO | Source: Ambulatory Visit | Attending: Family Medicine | Admitting: Family Medicine

## 2023-04-03 ENCOUNTER — Encounter: Payer: Self-pay | Admitting: Family Medicine

## 2023-04-03 ENCOUNTER — Ambulatory Visit: Payer: Medicare HMO | Attending: Family Medicine | Admitting: Family Medicine

## 2023-04-03 VITALS — BP 121/85 | HR 70 | Temp 98.1°F | Ht 64.0 in | Wt 167.8 lb

## 2023-04-03 DIAGNOSIS — Z01419 Encounter for gynecological examination (general) (routine) without abnormal findings: Secondary | ICD-10-CM | POA: Diagnosis not present

## 2023-04-03 DIAGNOSIS — Z124 Encounter for screening for malignant neoplasm of cervix: Secondary | ICD-10-CM

## 2023-04-03 DIAGNOSIS — L0292 Furuncle, unspecified: Secondary | ICD-10-CM | POA: Diagnosis not present

## 2023-04-03 DIAGNOSIS — Z113 Encounter for screening for infections with a predominantly sexual mode of transmission: Secondary | ICD-10-CM

## 2023-04-03 DIAGNOSIS — R1319 Other dysphagia: Secondary | ICD-10-CM | POA: Diagnosis not present

## 2023-04-03 DIAGNOSIS — Z1151 Encounter for screening for human papillomavirus (HPV): Secondary | ICD-10-CM | POA: Insufficient documentation

## 2023-04-03 NOTE — Patient Instructions (Signed)
Dysphagia Eating Plan, Bite Size Food This diet is recommended for people who are not able to bite pieces of food but are able to chew. You may need this diet if you have weakness of the muscles that control swallowing, you have an increased risk of choking, or you suffer from fatigue when chewing. Foods in the diet are soft, tender, and moist. Work with your health care provider, your diet and nutrition specialist (dietitian), or speech-language pathologist to make sure you are following the eating plan safely and getting all the nutrients you need. What are tips for following this plan? Cooking To moisten foods, add liquids while you are blending, mashing, or grinding your foods to the right consistency. These liquids include gravies, sauces, vegetable or fruit juice, milk, half and half, or water. Strain extra liquid from foods before eating. Reheat foods slowly to prevent a tough crust from forming. Prepare foods in advance. Meal planning Eat a variety of foods to get all the nutrients you need. Some foods may be tolerated better than others. Work with your Public relations account executive to identify which foods are safest for you to eat. Follow your meal plan as told by your dietitian. General information You may eat foods that are tender, soft, and moist. Always test food texture before taking a bite. Poke food with a fork or spoon to make sure it is tender. The test sample should squash, break apart, or change shape, and it should not return to its original shape when the fork or spoon is removed. Food should be easy to cut and chew. Avoid large pieces of food that require a lot of chewing. Take small bites. Each bite should be smaller than your thumbnail (about 15 mm by 15 mm for adults and 8mm by 8 mm for children). If you were on a pureed or minced food eating plan, you may eat any of the foods included in those diets. Avoid foods that are very dry, hard, sticky, chewy, coarse, or  crunchy. If instructed by your health care provider, thicken liquids. Follow your health care provider's instructions for what products to use, how to do this, and to what thickness. What foods should I eat?        Fruits Canned or cooked fruits that are soft or moist and do not have skin or seeds. Fresh, soft bananas. Vegetables Soft, well-cooked vegetables in small pieces. Soft-cooked, mashed potatoes. Grains Moist breads without nuts or seeds. Biscuits, muffins, pancakes, and waffles that are well-moistened with syrup, jelly, margarine, or butter. Cooked cereals. Moist bread stuffing. Moist rice. Well-moistened cold cereal with small chunks. Well-cooked pasta, noodles, and rice in small pieces and thick sauce. Soft dumplings or spaetzle in small pieces and butter or gravy. Meats and other proteins Tender, moist meats or poultry in small pieces. Moist meatballs or meatloaf. Fish without bones. Eggs or egg substitutes in small pieces. Tofu. Tempeh and meat alternatives in small pieces. Well-cooked, tender beans, peas, baked beans, and other legumes. Dairy Milk. Cream cheese. Yogurt. Cottage cheese. Sour cream. Small pieces of soft cheese. Fats and oils Butter. Oils. Margarine. Mayonnaise. Gravy. Spreads. Sweets and desserts Soft, smooth, moist desserts. Pudding. Custard. Moist cakes. Jam. Jelly. Honey. Preserves. Ask your health care provider whether you can have frozen desserts. Seasonings and other foods All seasonings and sweeteners. All sauces with small chunks. Prepared tuna, egg, or chicken salad without raw fruits or vegetables. Moist casseroles with small, tender pieces of meat. Soups with tender meat. The items listed  above may not be a complete list of foods and beverages you can eat. Contact a dietitian for more information. What foods should I avoid? Fruits Hard, crunchy, stringy, high-pulp, and juicy raw fruits such as apples, pineapple, papaya, and watermelon. Small, round  fruits, such as grapes. Dried fruit and fruit leather. Vegetables All raw vegetables. Cooked corn. Rubbery or stiff cooked vegetables. Stringy vegetables, such as celery. Tough, crisp fried potatoes. Potato skins. Grains Coarse or dry cereals. Dry breads. Toast. Crackers. Tough, crusty breads, such as [place] bread and baguettes. Dry pancakes, waffles, and muffins. Sticky rice. Dry bread stuffing. Granola. Popcorn. Chips. Meats and other proteins Large pieces of meat. Dry, tough meats, such as bacon, sausage, and hot dogs. Chicken, [place], or fish with skin and bones. Crunchy peanut butter. Nuts. Seeds. Nut and seed butters. Dairy Yogurt with nuts, seeds, or large chunks. Large chunks of cheese. Sweets and desserts Dry cakes. Chewy or dry cookies. Any desserts with nuts, seeds, dry fruits, coconut, pineapple, or anything dry, sticky, or hard. Chewy caramel. Licorice. Taffy-type candies. Ask your health care provider whether you can have frozen desserts. Seasonings and other foods Soups with tough or large chunks of meats, poultry, or vegetables. Corn or clam chowder. Smoothies with large chunks of fruit. The items listed above may not be a complete list of foods and beverages you should avoid. Contact a dietitian for more information. Summary Bite-size foods can be helpful for people with swallowing problems. On this dysphagia eating plan, you may eat foods that are soft, moist, and cut into pieces smaller than your thumbnail (about 15 mm by 15 mm for adults and 8mm by 8 mm for children). You may be instructed to thicken liquids. Follow your health care provider's instructions about how to do this and to what consistency. This information is not intended to replace advice given to you by your health care provider. Make sure you discuss any questions you have with your health care provider. Document Revised: 11/04/2021 Document Reviewed: 11/04/2021 Elsevier Patient Education  2024 [employer].

## 2023-04-03 NOTE — Progress Notes (Signed)
Discuss recent ED visit. 
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
## 2023-04-04 LAB — CERVICOVAGINAL ANCILLARY ONLY
Bacterial Vaginitis (gardnerella): POSITIVE — AB
Candida Glabrata: NEGATIVE
Candida Vaginitis: NEGATIVE
Chlamydia: NEGATIVE
Comment: NEGATIVE
Comment: NEGATIVE
Comment: NEGATIVE
Comment: NEGATIVE
Comment: NEGATIVE
Comment: NORMAL
Neisseria Gonorrhea: NEGATIVE
Trichomonas: NEGATIVE

## 2023-04-05 ENCOUNTER — Other Ambulatory Visit: Payer: Self-pay | Admitting: Family Medicine

## 2023-04-05 LAB — CYTOLOGY - PAP
Comment: NEGATIVE
Diagnosis: UNDETERMINED — AB
High risk HPV: NEGATIVE

## 2023-04-05 MED ORDER — METRONIDAZOLE 500 MG PO TABS: 500.0000 mg | ORAL_TABLET | Freq: Two times a day (BID) | ORAL | 0 refills | Status: AC

## 2023-04-07 ENCOUNTER — Encounter: Payer: Self-pay | Admitting: Family Medicine

## 2023-04-10 ENCOUNTER — Encounter: Payer: Medicare HMO | Admitting: Podiatry

## 2023-04-13 DIAGNOSIS — J3089 Other allergic rhinitis: Secondary | ICD-10-CM | POA: Diagnosis not present

## 2023-04-20 ENCOUNTER — Encounter: Payer: Medicare HMO | Admitting: Podiatry

## 2023-04-24 ENCOUNTER — Encounter: Payer: Medicare HMO | Admitting: Podiatry

## 2023-05-04 ENCOUNTER — Encounter: Payer: Medicare HMO | Admitting: Podiatry

## 2023-05-08 ENCOUNTER — Encounter: Payer: Medicare HMO | Admitting: Podiatry

## 2023-05-11 DIAGNOSIS — J3089 Other allergic rhinitis: Secondary | ICD-10-CM | POA: Diagnosis not present

## 2023-05-18 ENCOUNTER — Encounter: Payer: Medicare HMO | Admitting: Podiatry

## 2023-05-23 DIAGNOSIS — J3089 Other allergic rhinitis: Secondary | ICD-10-CM | POA: Diagnosis not present

## 2023-05-23 DIAGNOSIS — J301 Allergic rhinitis due to pollen: Secondary | ICD-10-CM | POA: Diagnosis not present

## 2023-05-23 DIAGNOSIS — J3081 Allergic rhinitis due to animal (cat) (dog) hair and dander: Secondary | ICD-10-CM | POA: Diagnosis not present

## 2023-06-22 DIAGNOSIS — J301 Allergic rhinitis due to pollen: Secondary | ICD-10-CM | POA: Diagnosis not present

## 2023-06-22 DIAGNOSIS — J3089 Other allergic rhinitis: Secondary | ICD-10-CM | POA: Diagnosis not present

## 2023-07-05 ENCOUNTER — Encounter: Payer: Medicare HMO | Attending: Physical Medicine & Rehabilitation | Admitting: Physical Medicine & Rehabilitation

## 2023-07-14 DIAGNOSIS — J3089 Other allergic rhinitis: Secondary | ICD-10-CM | POA: Diagnosis not present

## 2023-07-19 ENCOUNTER — Ambulatory Visit (INDEPENDENT_AMBULATORY_CARE_PROVIDER_SITE_OTHER): Payer: Medicare HMO | Admitting: Gastroenterology

## 2023-07-19 ENCOUNTER — Encounter: Payer: Self-pay | Admitting: Gastroenterology

## 2023-07-19 VITALS — BP 108/68 | HR 80 | Ht 64.0 in | Wt 171.0 lb

## 2023-07-19 DIAGNOSIS — R1319 Other dysphagia: Secondary | ICD-10-CM

## 2023-07-19 DIAGNOSIS — R12 Heartburn: Secondary | ICD-10-CM

## 2023-07-19 DIAGNOSIS — R1310 Dysphagia, unspecified: Secondary | ICD-10-CM | POA: Diagnosis not present
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
## 2023-07-19 NOTE — Patient Instructions (Addendum)
You have been scheduled for an endoscopy. Please follow written instructions given to you at your visit today.  If you use inhalers (even only as needed), please bring them with you on the day of your procedure.  If you take any of the following medications, they will need to be adjusted prior to your procedure:   DO NOT TAKE 7 DAYS PRIOR TO TEST- Trulicity (dulaglutide) Ozempic, Wegovy (semaglutide) Mounjaro (tirzepatide) Bydureon Bcise (exanatide extended release)  DO NOT TAKE 1 DAY PRIOR TO YOUR TEST Rybelsus (semaglutide) Adlyxin (lixisenatide) Victoza (liraglutide) Byetta (exanatide) ___________________________________________________________________________ ______________________________________________________  If your blood pressure at your visit was 140/90 or greater, please contact your primary care physician to follow up on this.  _______________________________________________________  If you are age 58 or older, your body mass index should be between 23-30. Your Body mass index is 29.35 kg/m. If this is out of the aforementioned range listed, please consider follow up with your Primary Care Provider.  If you are age 29 or younger, your body mass index should be between 19-25. Your Body mass index is 29.35 kg/m. If this is out of the aformentioned range listed, please consider follow up with your Primary Care Provider.   ________________________________________________________  The [facility] providers would like to encourage you to use [facility] to communicate with providers for non-urgent requests or questions.  Due to long hold times on the telephone, sending your provider a message by [facility] may be a faster and more efficient way to get a response.  Please allow 48 business hours for a response.  Please remember that this is for non-urgent requests.  _______________________________________________________ It was a pleasure to see you today!  Thank you for trusting  me with your gastrointestinal care!

## 2023-07-20 DIAGNOSIS — J3089 Other allergic rhinitis: Secondary | ICD-10-CM | POA: Diagnosis not present

## 2023-07-27 DIAGNOSIS — J3089 Other allergic rhinitis: Secondary | ICD-10-CM | POA: Diagnosis not present

## 2023-07-31 ENCOUNTER — Encounter: Payer: Medicare HMO | Admitting: Gastroenterology

## 2023-08-03 DIAGNOSIS — J3089 Other allergic rhinitis: Secondary | ICD-10-CM | POA: Diagnosis not present

## 2023-08-10 DIAGNOSIS — J3089 Other allergic rhinitis: Secondary | ICD-10-CM | POA: Diagnosis not present

## 2023-08-10 DIAGNOSIS — J301 Allergic rhinitis due to pollen: Secondary | ICD-10-CM | POA: Diagnosis not present

## 2023-08-10 DIAGNOSIS — J3081 Allergic rhinitis due to animal (cat) (dog) hair and dander: Secondary | ICD-10-CM | POA: Diagnosis not present

## 2023-08-29 ENCOUNTER — Encounter: Payer: Medicare HMO | Admitting: Gastroenterology

## 2023-08-29 ENCOUNTER — Telehealth: Payer: Self-pay | Admitting: Gastroenterology

## 2023-08-29 NOTE — Telephone Encounter (Signed)
Thank you for the note.  Today's procedure was a reschedule from an earlier date.  No-show is a wasted slot at a busy time of year.  When she contacts [place], she can be offered one chance to reschedule, and it must be one of the last two slots of an afternoon.  - H. Myrtie Neither, MD

## 2023-08-29 NOTE — Telephone Encounter (Signed)
Good Morning Dr. Myrtie Neither,  I called this patient this morning at 9:15 am she stated she had the days mixed up.   She will call back to reschedule.    I will NO SHOW this patient.  medicaid

## 2023-08-30 ENCOUNTER — Encounter: Payer: Medicare HMO | Attending: Physical Medicine & Rehabilitation | Admitting: Physical Medicine & Rehabilitation

## 2023-08-30 ENCOUNTER — Encounter: Payer: Self-pay | Admitting: Physical Medicine & Rehabilitation

## 2023-08-30 VITALS — BP 111/79 | HR 89 | Ht 64.0 in | Wt 167.0 lb

## 2023-08-30 DIAGNOSIS — F329 Major depressive disorder, single episode, unspecified: Secondary | ICD-10-CM | POA: Diagnosis not present

## 2023-08-30 DIAGNOSIS — S06306S Unspecified focal traumatic brain injury with loss of consciousness greater than 24 hours without return to pre-existing conscious level with patient surviving, sequela: Secondary | ICD-10-CM | POA: Diagnosis not present

## 2023-08-30 DIAGNOSIS — F5102 Adjustment insomnia: Secondary | ICD-10-CM | POA: Insufficient documentation

## 2023-08-30 DIAGNOSIS — S06306D Unspecified focal traumatic brain injury with loss of consciousness greater than 24 hours without return to pre-existing conscious level with patient surviving, subsequent encounter: Secondary | ICD-10-CM

## 2023-08-30 MED ORDER — SERTRALINE HCL 100 MG PO TABS: 100.0000 mg | ORAL_TABLET | Freq: Every day | ORAL | 3 refills | Status: DC

## 2023-08-30 NOTE — Patient Instructions (Addendum)
Grief counseling:  Grief [facility] is a one-time information and support group session for the newly bereaved to learn about common grief responses and coping skills. This virtual meeting takes place on the first Friday of the month from 12 - 1 p.m. The virtual support session is open to the public. Pre-registration is required. To register, please call the [facility] at [phone].     [facility]----BI support group  2nd Tuesday of the month at 4pm----walk in the main entrance, go past the pool and it's the first room on right.      Melatonin 5mg  at bedtime for sleep. Can increase to 10mg  ---if it doesn't improve, call me.
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
## 2023-09-07 DIAGNOSIS — J3089 Other allergic rhinitis: Secondary | ICD-10-CM | POA: Diagnosis not present

## 2023-09-15 ENCOUNTER — Encounter (HOSPITAL_COMMUNITY): Payer: Self-pay | Admitting: Emergency Medicine

## 2023-09-15 ENCOUNTER — Ambulatory Visit (HOSPITAL_COMMUNITY)
Admission: EM | Admit: 2023-09-15 | Discharge: 2023-09-15 | Disposition: A | Discharge status: Home / Self Care | Payer: Medicare HMO | Attending: Nurse Practitioner | Admitting: Nurse Practitioner

## 2023-09-15 DIAGNOSIS — N3000 Acute cystitis without hematuria: Secondary | ICD-10-CM | POA: Diagnosis not present

## 2023-09-15 LAB — POCT URINALYSIS DIP (MANUAL ENTRY)
Bilirubin, UA: NEGATIVE
Blood, UA: NEGATIVE
Glucose, UA: NEGATIVE mg/dL
Nitrite, UA: POSITIVE — AB
Protein Ur, POC: NEGATIVE mg/dL
Spec Grav, UA: 1.025 (ref 1.010–1.025)
Urobilinogen, UA: 1 U/dL
pH, UA: 7 (ref 5.0–8.0)

## 2023-09-15 MED ORDER — NITROFURANTOIN MONOHYD MACRO 100 MG PO CAPS: 100.0000 mg | ORAL_CAPSULE | Freq: Two times a day (BID) | ORAL | 0 refills | Status: DC
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
## 2023-09-15 NOTE — Discharge Instructions (Addendum)
Your urinalysis is positive for bacteria. Urine culture pending this confirms the type of bacteria and if the treatment is effective. Due to your symptoms and history you have been started on a treatment of Macrobid twice a day for 5 days.  Encourage completion of your treatment even when symptoms improve.  Discussed resistance with antibiotic if over used.   Discussed allergic reactions with antibiotics Encourage increasing hydration with water and how to tell when this is achieved Add cranberry juice 100% 8 -16 ozs daily until symptoms improve Discussed hygiene and voiding when the urge presents

## 2023-09-15 NOTE — ED Triage Notes (Addendum)
Pt c/o vaginal odor, intermittent sharp pains in vagina, and slight burning after urinating for 1 week.

## 2023-09-19 LAB — CERVICOVAGINAL ANCILLARY ONLY
Bacterial Vaginitis (gardnerella): NEGATIVE
Candida Glabrata: NEGATIVE
Candida Vaginitis: NEGATIVE
Chlamydia: NEGATIVE
Comment: NEGATIVE
Comment: NEGATIVE
Comment: NEGATIVE
Comment: NEGATIVE
Comment: NEGATIVE
Comment: NORMAL
Neisseria Gonorrhea: NEGATIVE
Trichomonas: NEGATIVE

## 2023-09-28 DIAGNOSIS — J3089 Other allergic rhinitis: Secondary | ICD-10-CM | POA: Diagnosis not present

## 2023-11-02 DIAGNOSIS — J3089 Other allergic rhinitis: Secondary | ICD-10-CM | POA: Diagnosis not present

## 2023-11-15 ENCOUNTER — Telehealth: Payer: Self-pay | Admitting: *Deleted

## 2023-11-15 NOTE — Telephone Encounter (Signed)
Leah Rojas mother called and is requesting another letter be written like the one from last year 12/12/22 addressing need for child care assistance. If possible they need it by 12/21/23.  They will pick up the letter.

## 2023-11-22 NOTE — Telephone Encounter (Signed)
 Letters written

## 2023-11-30 DIAGNOSIS — J3089 Other allergic rhinitis: Secondary | ICD-10-CM | POA: Diagnosis not present

## 2023-12-13 ENCOUNTER — Encounter: Payer: Self-pay | Admitting: Physical Medicine & Rehabilitation

## 2023-12-13 ENCOUNTER — Encounter: Payer: Medicare HMO | Attending: Physical Medicine & Rehabilitation | Admitting: Physical Medicine & Rehabilitation

## 2023-12-13 VITALS — BP 117/72 | HR 91 | Ht 64.0 in | Wt 174.2 lb

## 2023-12-13 DIAGNOSIS — S069XAS Unspecified intracranial injury with loss of consciousness status unknown, sequela: Secondary | ICD-10-CM | POA: Insufficient documentation

## 2023-12-13 DIAGNOSIS — S0990XS Unspecified injury of head, sequela: Secondary | ICD-10-CM | POA: Diagnosis not present

## 2023-12-13 DIAGNOSIS — F09 Unspecified mental disorder due to known physiological condition: Secondary | ICD-10-CM | POA: Insufficient documentation

## 2023-12-13 DIAGNOSIS — R4689 Other symptoms and signs involving appearance and behavior: Secondary | ICD-10-CM | POA: Diagnosis not present

## 2023-12-13 NOTE — Patient Instructions (Addendum)
 LINK FOR MINDFULNESS TECHNIQUES:  https://positivepsychology.com/mindfulness-exercises-techniques-activities/   BRAIN INJURY SUPPORT GROUP MEETS THE SECOND TUESDAY OF THE MONTH AT 4PM  AT THE [facility] AT [address] ([address]  THE ROOM IS ON THE FIRST FLOOR, FIRST CONFERENCE ROOM ON YOUR RIGHT AFTER YOU WALK IN.  (150' APPROXIMATELY FROM ENTRANCE, MAYBE A BIT LONGER)
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
## 2023-12-18 ENCOUNTER — Ambulatory Visit: Admitting: Podiatry

## 2023-12-26 ENCOUNTER — Ambulatory Visit: Attending: Family Medicine

## 2023-12-26 DIAGNOSIS — J309 Allergic rhinitis, unspecified: Secondary | ICD-10-CM | POA: Diagnosis not present

## 2023-12-29 ENCOUNTER — Ambulatory Visit (INDEPENDENT_AMBULATORY_CARE_PROVIDER_SITE_OTHER): Admitting: Podiatry

## 2023-12-29 ENCOUNTER — Encounter: Payer: Self-pay | Admitting: Podiatry

## 2023-12-29 DIAGNOSIS — M21619 Bunion of unspecified foot: Secondary | ICD-10-CM

## 2023-12-29 DIAGNOSIS — L84 Corns and callosities: Secondary | ICD-10-CM | POA: Diagnosis not present

## 2023-12-29 DIAGNOSIS — M2041 Other hammer toe(s) (acquired), right foot: Secondary | ICD-10-CM

## 2023-12-29 DIAGNOSIS — M7752 Other enthesopathy of left foot: Secondary | ICD-10-CM | POA: Diagnosis not present

## 2023-12-29 DIAGNOSIS — M7750 Other enthesopathy of unspecified foot: Secondary | ICD-10-CM

## 2023-12-29 DIAGNOSIS — M7751 Other enthesopathy of right foot: Secondary | ICD-10-CM | POA: Diagnosis not present

## 2023-12-29 NOTE — Progress Notes (Unsigned)
 Subjective: Chief Complaint  Patient presents with   Injections    Patient is here for injections in bilateral feet and patient would like for doctor to take a look at her 3rd toe on her right foot     33 year old female presents to the office for above concerns.  She is requesting injections in both of her bunions that are causing discomfort.  She is also starts noticed a corn or something on her right third toe forming which causes discomfort.  She does not report any ulcerations.  No swelling redness or drainage.  No injuries.  She has no other concerns today.    Objective: AAO x3, NAD DP/PT pulses palpable bilaterally, CRT less than 3 seconds Bunion present bilateral.  Incurvation present to both medial, lateral aspects of hallux toenails without any edema, erythema or signs of infection today.  There is tenderness palpation of the nail borders. No pain with calf compression, swelling, warmth, erythema  Assessment: Ingrown toenail b/l hallux  Plan: -All treatment options discussed with the patient including all alternatives, risks, complications.  -Discussed conservative versus surgical intervention.  She has tried routine debridement improvement.  We discussed partial nail avulsion she was proceed with this.  She would like to do this at the same time of surgery.  Hold this on the left side while she is undergoing her right bunion and implant of the right side later on.  Consent updated.  -Monitor for any clinical signs or symptoms of infection and directed to call the office immediately should any occur or go to the ER. -Patient encouraged to call the office with any questions, concerns, change in symptoms.   Vivi Barrack DPM   Inj b/l Right 3rd  corn ht

## 2023-12-29 NOTE — Patient Instructions (Signed)

## 2024-01-08 DIAGNOSIS — J3089 Other allergic rhinitis: Secondary | ICD-10-CM | POA: Diagnosis not present

## 2024-02-01 DIAGNOSIS — J3089 Other allergic rhinitis: Secondary | ICD-10-CM | POA: Diagnosis not present

## 2024-02-15 ENCOUNTER — Other Ambulatory Visit: Payer: Self-pay | Admitting: Physical Medicine & Rehabilitation

## 2024-02-15 DIAGNOSIS — J3089 Other allergic rhinitis: Secondary | ICD-10-CM | POA: Diagnosis not present

## 2024-02-15 DIAGNOSIS — J309 Allergic rhinitis, unspecified: Secondary | ICD-10-CM | POA: Diagnosis not present

## 2024-02-26 DIAGNOSIS — J3089 Other allergic rhinitis: Secondary | ICD-10-CM | POA: Diagnosis not present

## 2024-02-29 DIAGNOSIS — J3089 Other allergic rhinitis: Secondary | ICD-10-CM | POA: Diagnosis not present

## 2024-03-07 DIAGNOSIS — J3089 Other allergic rhinitis: Secondary | ICD-10-CM | POA: Diagnosis not present

## 2024-03-12 DIAGNOSIS — J3089 Other allergic rhinitis: Secondary | ICD-10-CM | POA: Diagnosis not present

## 2024-03-13 ENCOUNTER — Ambulatory Visit: Payer: Self-pay
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
## 2024-03-14 ENCOUNTER — Ambulatory Visit (HOSPITAL_COMMUNITY)
Admission: RE | Admit: 2024-03-14 | Discharge: 2024-03-14 | Disposition: A | Discharge status: Home / Self Care | Source: Ambulatory Visit | Attending: Family Medicine | Admitting: Family Medicine

## 2024-03-14 ENCOUNTER — Encounter (HOSPITAL_COMMUNITY): Payer: Self-pay

## 2024-03-14 VITALS — BP 109/73 | HR 78 | Temp 98.1°F | Resp 16 | Ht 64.0 in | Wt 176.0 lb

## 2024-03-14 DIAGNOSIS — N3000 Acute cystitis without hematuria: Secondary | ICD-10-CM

## 2024-03-14 DIAGNOSIS — J3089 Other allergic rhinitis: Secondary | ICD-10-CM | POA: Diagnosis not present

## 2024-03-14 LAB — POCT URINALYSIS DIP (MANUAL ENTRY)
Bilirubin, UA: NEGATIVE
Blood, UA: NEGATIVE
Glucose, UA: NEGATIVE mg/dL
Ketones, POC UA: NEGATIVE mg/dL
Nitrite, UA: POSITIVE — AB
Protein Ur, POC: NEGATIVE mg/dL
Spec Grav, UA: 1.02 (ref 1.010–1.025)
Urobilinogen, UA: 1 U/dL
pH, UA: 7.5 (ref 5.0–8.0)

## 2024-03-14 MED ORDER — NITROFURANTOIN MONOHYD MACRO 100 MG PO CAPS: 100.0000 mg | ORAL_CAPSULE | Freq: Two times a day (BID) | ORAL | 0 refills | Status: AC

## 2024-03-14 NOTE — Telephone Encounter (Signed)
 Noted
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
## 2024-03-14 NOTE — ED Triage Notes (Signed)
 Chief Complaint: Body feeling weak , just sick feeling , real bad odor smell after using bathroom. - Entered by patient. Denies painful urination, states slight irritation and urgency with urination.   Sick exposure: No  Onset: 3 weeks ago.   Prescriptions or OTC medications tried: Yes- Increased water intake    with no relief  New foods, medications, or products: No  Recent Travel: No

## 2024-04-18 DIAGNOSIS — J3089 Other allergic rhinitis: Secondary | ICD-10-CM | POA: Diagnosis not present

## 2024-05-07 ENCOUNTER — Other Ambulatory Visit: Payer: Self-pay | Admitting: Physical Medicine & Rehabilitation

## 2024-05-09 DIAGNOSIS — J3089 Other allergic rhinitis: Secondary | ICD-10-CM | POA: Diagnosis not present

## 2024-06-06 DIAGNOSIS — J3089 Other allergic rhinitis: Secondary | ICD-10-CM | POA: Diagnosis not present

## 2024-06-12 ENCOUNTER — Encounter: Payer: Self-pay | Admitting: Physical Medicine & Rehabilitation

## 2024-06-12 ENCOUNTER — Encounter: Attending: Physical Medicine & Rehabilitation | Admitting: Physical Medicine & Rehabilitation

## 2024-06-12 VITALS — BP 111/75 | HR 85 | Ht 64.0 in

## 2024-06-12 DIAGNOSIS — F5102 Adjustment insomnia: Secondary | ICD-10-CM | POA: Insufficient documentation

## 2024-06-12 DIAGNOSIS — S069XAS Unspecified intracranial injury with loss of consciousness status unknown, sequela: Secondary | ICD-10-CM | POA: Insufficient documentation

## 2024-06-12 DIAGNOSIS — F329 Major depressive disorder, single episode, unspecified: Secondary | ICD-10-CM | POA: Diagnosis not present

## 2024-06-12 DIAGNOSIS — R4689 Other symptoms and signs involving appearance and behavior: Secondary | ICD-10-CM | POA: Insufficient documentation

## 2024-06-12 DIAGNOSIS — S069X4A Unspecified intracranial injury with loss of consciousness of 6 hours to 24 hours, initial encounter: Secondary | ICD-10-CM

## 2024-06-12 DIAGNOSIS — S069X4S Unspecified intracranial injury with loss of consciousness of 6 hours to 24 hours, sequela: Secondary | ICD-10-CM | POA: Insufficient documentation

## 2024-06-12 MED ORDER — SERTRALINE HCL 100 MG PO TABS: 100.0000 mg | ORAL_TABLET | Freq: Every day | ORAL | 6 refills | Status: DC
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
## 2024-06-12 NOTE — Patient Instructions (Addendum)
 ALWAYS FEEL FREE TO CALL OUR OFFICE WITH ANY PROBLEMS OR QUESTIONS [phone])  **PLEASE NOTE** ALL MEDICATION REFILL REQUESTS (INCLUDING CONTROLLED SUBSTANCES) NEED TO BE MADE AT LEAST 7 DAYS PRIOR TO REFILL BEING DUE. ANY REFILL REQUESTS INSIDE THAT TIME FRAME MAY RESULT IN DELAYS IN RECEIVING YOUR PRESCRIPTION.                    TRY 10MG  MELATONIN AT BEDTIME---CAN GO UP TO 15MG  IF NEEDED CONSISTENT BED TIME GOAL OF 8-10 HOURS OF SLEEP EACH NIGHT AVOID DAYTIME NAPS ? RESUME TRAZODONE  ?  CONTINUE WITH EXERCISE DAILY TO HELP WITH YOUR ENERGY AND METABOLISM.

## 2024-07-04 DIAGNOSIS — J309 Allergic rhinitis, unspecified: Secondary | ICD-10-CM | POA: Diagnosis not present

## 2024-07-28 ENCOUNTER — Emergency Department (HOSPITAL_BASED_OUTPATIENT_CLINIC_OR_DEPARTMENT_OTHER)

## 2024-07-28 ENCOUNTER — Other Ambulatory Visit: Payer: Self-pay

## 2024-07-28 ENCOUNTER — Emergency Department (HOSPITAL_BASED_OUTPATIENT_CLINIC_OR_DEPARTMENT_OTHER)
Admission: EM | Admit: 2024-07-28 | Discharge: 2024-07-28 | Disposition: A | Discharge status: Home / Self Care | Attending: Emergency Medicine | Admitting: Emergency Medicine

## 2024-07-28 ENCOUNTER — Encounter (HOSPITAL_BASED_OUTPATIENT_CLINIC_OR_DEPARTMENT_OTHER): Payer: Self-pay

## 2024-07-28 DIAGNOSIS — X501XXA Overexertion from prolonged static or awkward postures, initial encounter: Secondary | ICD-10-CM | POA: Insufficient documentation

## 2024-07-28 DIAGNOSIS — S99922A Unspecified injury of left foot, initial encounter: Secondary | ICD-10-CM | POA: Diagnosis not present

## 2024-07-28 DIAGNOSIS — M25572 Pain in left ankle and joints of left foot: Secondary | ICD-10-CM | POA: Diagnosis not present

## 2024-07-28 DIAGNOSIS — Y9301 Activity, walking, marching and hiking: Secondary | ICD-10-CM | POA: Diagnosis not present

## 2024-07-28 DIAGNOSIS — N760 Acute vaginitis: Secondary | ICD-10-CM | POA: Diagnosis not present

## 2024-07-28 DIAGNOSIS — N3000 Acute cystitis without hematuria: Secondary | ICD-10-CM

## 2024-07-28 DIAGNOSIS — R300 Dysuria: Secondary | ICD-10-CM | POA: Insufficient documentation

## 2024-07-28 DIAGNOSIS — S99912A Unspecified injury of left ankle, initial encounter: Secondary | ICD-10-CM | POA: Diagnosis not present

## 2024-07-28 DIAGNOSIS — S93602A Unspecified sprain of left foot, initial encounter: Secondary | ICD-10-CM

## 2024-07-28 DIAGNOSIS — B9689 Other specified bacterial agents as the cause of diseases classified elsewhere: Secondary | ICD-10-CM

## 2024-07-28 LAB — URINALYSIS, ROUTINE W REFLEX MICROSCOPIC
Bilirubin Urine: NEGATIVE
Glucose, UA: NEGATIVE mg/dL
Hgb urine dipstick: NEGATIVE
Ketones, ur: NEGATIVE mg/dL
Nitrite: POSITIVE — AB
Specific Gravity, Urine: 1.025 (ref 1.005–1.030)
pH: 7 (ref 5.0–8.0)

## 2024-07-28 LAB — WET PREP, GENITAL
Sperm: NONE SEEN
Trich, Wet Prep: NONE SEEN
WBC, Wet Prep HPF POC: <10 (ref <10)
Yeast Wet Prep HPF POC: NONE SEEN

## 2024-07-28 MED ORDER — FOSFOMYCIN TROMETHAMINE 3 G PO PACK
3.0000 g | PACK | Freq: Once | ORAL | Status: AC
  Administered 2024-07-28: 3 g via ORAL
  Filled 2024-07-28: qty 3

## 2024-07-28 MED ORDER — METRONIDAZOLE 500 MG PO TABS: 500.0000 mg | ORAL_TABLET | Freq: Two times a day (BID) | ORAL | 0 refills | Status: AC

## 2024-07-28 NOTE — ED Provider Notes (Signed)
 [place] EMERGENCY DEPARTMENT AT [facility] Provider Note   CSN: [id number] Arrival date & time: 07/28/24  9052     Patient presents with: Foot Pain and Dysuria   Leah Rojas is a 33 y.o. female with a past medical history of traumatic brain injury who presents to the er with a chief complaint of left ankle injury and burning with urination.  Patient reports that she rolled her ankle while walking 2 days ago for Halloween.  She states that first her ankle and foot hurt and she was having trouble walking yesterday she was able to ambulate but still had some pain it is and she came in for further evaluation.  Patient also reports that she has had burning with urination and it smells like mothballs.  She could not make sure that she also did not have any STDs she has had no unprotected intercourse since October 3 with a regular partner.  She denies vaginal symptoms.    Foot Pain  Dysuria      Prior to Admission medications   Medication Sig Start Date End Date Taking? Authorizing Provider  Azelastine HCl 137 MCG/SPRAY SOLN 2 sprays 2 (two) times a day as needed for rhinitis. 11/03/21   [provider]  diphenhydrAMINE  (BENADRYL ) 25 MG tablet Take by mouth. 11/03/21   [provider]  EPINEPHrine 0.3 mg/0.3 mL IJ SOAJ injection Inject into the muscle. 01/10/22   [provider]  ipratropium (ATROVENT ) 0.03 % nasal spray Place 2 sprays into both nostrils 4 (four) times daily. 02/02/23   [provider]  montelukast (SINGULAIR) 10 MG tablet Take 1 tablet by mouth at bedtime. 02/02/23   [provider]  nitrofurantoin , macrocrystal-monohydrate, (MACROBID ) 100 MG capsule Take 1 capsule (100 mg total) by mouth 2 (two) times daily. 03/14/24   Janet Lonni BRAVO, MD  Olopatadine HCl 0.2 % SOLN Apply to eye. 11/03/21   [provider]  sertraline  (ZOLOFT ) 100 MG tablet Take 1 tablet (100 mg total) by mouth daily. 06/12/24   Babs Arthea DASEN, MD  traZODone  (DESYREL ) 50 MG tablet Take 0.5-1 tablets (25-50 mg total) by mouth at bedtime. Patient not taking: Reported on 04/08/2020 02/12/18 07/23/20  Babs Arthea DASEN, MD    Allergies: Patient has no known allergies.    Review of Systems  Genitourinary:  Positive for dysuria.    Updated Vital Signs BP (!) 150/81 (BP Location: Right Arm)   Pulse 98   Temp 98.5 F (36.9 C) (Oral)   Resp 16   Ht 5' 4 (1.626 m)   Wt 78 kg   LMP 07/13/2024   SpO2 98%   BMI 29.52 kg/m   Physical Exam Vitals and nursing note reviewed.  Constitutional:      General: She is not in acute distress.    Appearance: She is well-developed. She is not diaphoretic.  HENT:     Head: Normocephalic and atraumatic.     Right Ear: External ear normal.     Left Ear: External ear normal.     Nose: Nose normal.     Mouth/Throat:     Mouth: Mucous membranes are moist.  Eyes:     General: No scleral icterus.    Conjunctiva/sclera: Conjunctivae normal.  Cardiovascular:     Rate and Rhythm: Normal rate and regular rhythm.     Heart sounds: Normal heart sounds. No murmur heard.    No friction rub. No gallop.  Pulmonary:     Effort: Pulmonary effort  is normal. No respiratory distress.     Breath sounds: Normal breath sounds.  Abdominal:     General: Bowel sounds are normal. There is no distension.     Palpations: Abdomen is soft. There is no mass.     Tenderness: There is no abdominal tenderness. There is no right CVA tenderness, left CVA tenderness or guarding.  Musculoskeletal:        General: Normal range of motion.     Cervical back: Normal range of motion.     Comments: There is swelling and tenderness over the lateral malleolus.No overt deformity. No tenderness over the medial aspect of the ankle. The fifth metatarsal is not tender. The ankle joint is intact without excessive opening on stressing.   Skin:    General: Skin is warm and dry.  Neurological:     Mental Status: She is alert and  oriented to person, place, and time.  Psychiatric:        Behavior: Behavior normal.    . (all labs ordered are listed, but only abnormal results are displayed) Labs Reviewed  URINALYSIS, ROUTINE W REFLEX MICROSCOPIC    EKG: None  Radiology: DG Foot Complete Left Result Date: 07/28/2024 EXAM: 3 Or More View Xray Of The Left Foot 07/28/2024 10:32:04 Am COMPARISON: None Available. CLINICAL HISTORY: 886475 Injury [id number] FINDINGS: BONES AND JOINTS: Mild Hallux Valgus. No Acute Fracture. No Focal Osseous Lesion. No Joint Dislocation. SOFT TISSUES: The Soft Tissues Are Unremarkable. IMPRESSION: 1. No acute findings. 2. Mild hallux valgus. Electronically signed by: Norleen Kil MD 07/28/2024 10:37 AM EST RP Workstation: [id number]   DG Ankle Left Port Result Date: 07/28/2024 EXAM: 1 Or 2 View(S) Xray Of The Ankle CLINICAL HISTORY: 886475 Injury [id number] COMPARISON: None available. FINDINGS: BONES AND JOINTS: No acute fracture. No focal osseous lesion. No joint dislocation. SOFT TISSUES: The soft tissues are unremarkable. IMPRESSION: 1. No acute osseous abnormality identified. Electronically signed by: Norleen Kil MD 07/28/2024 10:36 AM EST RP Workstation: [id number]     Procedures   Medications Ordered in the ED - No data to display                                  Medical Decision Making Amount and/or Complexity of Data Reviewed Labs: ordered. Radiology: ordered.  Risk Prescription drug management.   Patient here with complaint of urinary symptoms and ankle pain.  I visualized and interpreted left foot and ankle x-ray there are no acute findings.  Will provide patient with an ASO splint she may take ibuprofen  and Tylenol  for pain relief.  Patient also appears to have a urinary tract infection and wet prep is positive for clue cells.  Will discharge with Flagyl .  She was treated in the ED with Phospha Meissen.  Discussed outpatient follow-up and return precautions     Final diagnoses:   None    ED Discharge Orders     None          Arloa Chroman, PA-C 07/28/24 1304    Dasie Faden, MD 08/02/24 [phone]

## 2024-07-28 NOTE — Discharge Instructions (Addendum)
 Your x-ray was negative you may use over-the-counter ibuprofen  and Tylenol  for pain.  You may wear your foot splint for the next 4 to 6 weeks if you continue to have worsening condition you may follow-up with orthopedics. Your urine showed an infection you were treated here with a single dose of fosfomycin which should treat your urinary tract infection completely.  Get help right away with your primary care physician if you continue to have worsening in condition including fevers chills nausea vomiting severe back pain confusion. Your wet prep showed a little bit of bacterial vaginosis.  This is a vaginal infection caused by an imbalance and the bacteria that normally lives in your vagina and not an STD.  You will be discharged with a medication called metronidazole .  Please complete the entire course of antibiotics.  Do not drink any alcohol while taking this medication as it can cause a severe reaction that will make you vomit severely.

## 2024-07-28 NOTE — ED Triage Notes (Signed)
 Patient presents with c/o L foot pain that started on 10/31 and dysuria that started on 10/19. Patient states she twisted her ankle on 10/31. No obvious deformity or swelling noted. Pulses and sensation intact.   Denies hematuria, fever, chills, nausea, vomiting.

## 2024-07-29 DIAGNOSIS — S93692A Other sprain of left foot, initial encounter: Secondary | ICD-10-CM | POA: Diagnosis not present

## 2024-07-29 LAB — GC/CHLAMYDIA PROBE AMP (~~LOC~~) NOT AT ARMC
Chlamydia: NEGATIVE
Comment: NEGATIVE
Comment: NORMAL
Neisseria Gonorrhea: NEGATIVE

## 2024-07-31 DIAGNOSIS — J3081 Allergic rhinitis due to animal (cat) (dog) hair and dander: Secondary | ICD-10-CM | POA: Diagnosis not present

## 2024-07-31 DIAGNOSIS — J301 Allergic rhinitis due to pollen: Secondary | ICD-10-CM | POA: Diagnosis not present

## 2024-08-29 DIAGNOSIS — J3081 Allergic rhinitis due to animal (cat) (dog) hair and dander: Secondary | ICD-10-CM | POA: Diagnosis not present

## 2024-08-29 DIAGNOSIS — J301 Allergic rhinitis due to pollen: Secondary | ICD-10-CM | POA: Diagnosis not present

## 2024-09-25 ENCOUNTER — Other Ambulatory Visit: Payer: Self-pay | Admitting: Physical Medicine & Rehabilitation

## 2024-09-25 MED ORDER — SERTRALINE HCL 100 MG PO TABS: 100.0000 mg | ORAL_TABLET | Freq: Every day | ORAL | 3 refills | Status: AC

## 2024-09-25 NOTE — Addendum Note (Signed)
 Addended by: Jaslene Marsteller W on: 09/25/2024 05:01 PM   Modules accepted: Orders

## 2024-09-30 ENCOUNTER — Emergency Department (HOSPITAL_COMMUNITY)

## 2024-09-30 ENCOUNTER — Other Ambulatory Visit: Payer: Self-pay

## 2024-09-30 ENCOUNTER — Emergency Department (HOSPITAL_COMMUNITY)
Admission: EM | Admit: 2024-09-30 | Discharge: 2024-09-30 | Disposition: A | Discharge status: Home / Self Care | Attending: Emergency Medicine | Admitting: Emergency Medicine

## 2024-09-30 DIAGNOSIS — M62838 Other muscle spasm: Secondary | ICD-10-CM | POA: Diagnosis not present

## 2024-09-30 DIAGNOSIS — R0602 Shortness of breath: Secondary | ICD-10-CM | POA: Insufficient documentation

## 2024-09-30 DIAGNOSIS — R197 Diarrhea, unspecified: Secondary | ICD-10-CM | POA: Diagnosis present

## 2024-09-30 DIAGNOSIS — B349 Viral infection, unspecified: Secondary | ICD-10-CM | POA: Diagnosis not present

## 2024-09-30 DIAGNOSIS — Z87891 Personal history of nicotine dependence: Secondary | ICD-10-CM | POA: Insufficient documentation

## 2024-09-30 LAB — CBC WITH DIFFERENTIAL/PLATELET
Abs Immature Granulocytes: 0.04 K/uL (ref 0.00–0.07)
Basophils Absolute: 0 K/uL (ref 0.0–0.1)
Basophils Relative: 0 %
Eosinophils Absolute: 0 K/uL (ref 0.0–0.5)
Eosinophils Relative: 0 %
HCT: 36.7 % (ref 36.0–46.0)
Hemoglobin: 12.2 g/dL (ref 12.0–15.0)
Immature Granulocytes: 1 %
Lymphocytes Relative: 19 %
Lymphs Abs: 1.6 K/uL (ref 0.7–4.0)
MCH: 31.1 pg (ref 26.0–34.0)
MCHC: 33.2 g/dL (ref 30.0–36.0)
MCV: 93.6 fL (ref 80.0–100.0)
Monocytes Absolute: 0.8 K/uL (ref 0.1–1.0)
Monocytes Relative: 9 %
Neutro Abs: 6 K/uL (ref 1.7–7.7)
Neutrophils Relative %: 71 %
Platelets: 407 K/uL — ABNORMAL HIGH (ref 150–400)
RBC: 3.92 MIL/uL (ref 3.87–5.11)
RDW: 14.6 % (ref 11.5–15.5)
WBC: 8.5 K/uL (ref 4.0–10.5)
nRBC: 0 % (ref 0.0–0.2)

## 2024-09-30 LAB — URINALYSIS, ROUTINE W REFLEX MICROSCOPIC
Bilirubin Urine: NEGATIVE
Bilirubin Urine: NEGATIVE
Glucose, UA: NEGATIVE mg/dL
Glucose, UA: NEGATIVE mg/dL
Hgb urine dipstick: NEGATIVE
Hgb urine dipstick: NEGATIVE
Ketones, ur: 5 mg/dL — AB
Ketones, ur: 5 mg/dL — AB
Leukocytes,Ua: NEGATIVE
Nitrite: NEGATIVE
Nitrite: NEGATIVE
Protein, ur: 30 mg/dL — AB
Protein, ur: 30 mg/dL — AB
Specific Gravity, Urine: 1.028 (ref 1.005–1.030)
Specific Gravity, Urine: 1.032 — ABNORMAL HIGH (ref 1.005–1.030)
pH: 5 (ref 5.0–8.0)
pH: 5 (ref 5.0–8.0)

## 2024-09-30 LAB — COMPREHENSIVE METABOLIC PANEL WITH GFR
ALT: 12 U/L (ref 0–44)
AST: 14 U/L — ABNORMAL LOW (ref 15–41)
Albumin: 4.3 g/dL (ref 3.5–5.0)
Alkaline Phosphatase: 57 U/L (ref 38–126)
Anion gap: 10 (ref 5–15)
BUN: 20 mg/dL (ref 6–20)
CO2: 24 mmol/L (ref 22–32)
Calcium: 9.2 mg/dL (ref 8.9–10.3)
Chloride: 105 mmol/L (ref 98–111)
Creatinine, Ser: 0.66 mg/dL (ref 0.44–1.00)
GFR, Estimated: >60 mL/min
Glucose, Bld: 81 mg/dL (ref 70–99)
Potassium: 4.5 mmol/L (ref 3.5–5.1)
Sodium: 139 mmol/L (ref 135–145)
Total Bilirubin: 0.3 mg/dL (ref 0.0–1.2)
Total Protein: 7.8 g/dL (ref 6.5–8.1)

## 2024-09-30 LAB — GROUP A STREP BY PCR: Group A Strep by PCR: NOT DETECTED

## 2024-09-30 LAB — HCG, SERUM, QUALITATIVE: Preg, Serum: NEGATIVE

## 2024-09-30 LAB — RESP PANEL BY RT-PCR (RSV, FLU A&B, COVID)  RVPGX2
Influenza A by PCR: NEGATIVE
Influenza B by PCR: NEGATIVE
Resp Syncytial Virus by PCR: NEGATIVE
SARS Coronavirus 2 by RT PCR: NEGATIVE

## 2024-09-30 LAB — LIPASE, BLOOD: Lipase: 31 U/L (ref 11–51)

## 2024-09-30 MED ORDER — LIDOCAINE 5 % EX PTCH
1.0000 | MEDICATED_PATCH | Freq: Once | CUTANEOUS | Status: DC
  Administered 2024-09-30: 1 via TRANSDERMAL
  Filled 2024-09-30: qty 1

## 2024-09-30 MED ORDER — HYDROCODONE-ACETAMINOPHEN 5-325 MG PO TABS
1.0000 | ORAL_TABLET | Freq: Once | ORAL | Status: AC
  Administered 2024-09-30: 1 via ORAL
  Filled 2024-09-30: qty 1

## 2024-09-30 MED ORDER — CYCLOBENZAPRINE HCL 10 MG PO TABS: 10.0000 mg | ORAL_TABLET | Freq: Two times a day (BID) | ORAL | 0 refills | Status: AC | PRN

## 2024-09-30 MED ORDER — OXYCODONE HCL 5 MG PO TABS: 5.0000 mg | ORAL_TABLET | ORAL | 0 refills | Status: AC | PRN
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
## 2024-09-30 NOTE — Discharge Instructions (Addendum)
 You have been seen in the Emergency Department (ED) today for a likely viral illness.  Please drink plenty of clear fluids (water, Gatorade, chicken broth, etc).  You may use Tylenol  and/or Motrin  according to label instructions.  You can alternate between the two without any side effects.   I am concerned you have a muscle spasm in your right shoulder.  Please use the sling, please follow-up with PCP in the next week for recheck.  I prescribed medications to help with your pain, do not take these medications if you become pregnant or think you might be pregnant.  Please follow up with your doctor as listed above.  Call your doctor or return to the Emergency Department (ED) if you are unable to tolerate fluids due to vomiting, have worsening trouble breathing, become extremely tired or difficult to awaken, or if you develop any other symptoms that concern you.
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
## 2024-09-30 NOTE — ED Notes (Signed)
..  Patient is A&Ox4 upon discharge. Patient verbalized understanding of prescriptions, discharge instructions and follow-up care. Patient ambulatory from ED with steady gait.

## 2024-12-11 ENCOUNTER — Ambulatory Visit: Admitting: Physical Medicine & Rehabilitation

## 2024-12-17 ENCOUNTER — Ambulatory Visit: Payer: Self-pay
